# Patient Record
Sex: Female | Born: 1955 | Race: Black or African American | Hispanic: No | Marital: Single | Smoking: Never smoker
Health system: Southern US, Community
[De-identification: ages and names within clinical notes are randomized; demographics above are authoritative.]

## PROBLEM LIST (undated history)

## (undated) DIAGNOSIS — E785 Hyperlipidemia, unspecified: Secondary | ICD-10-CM

## (undated) DIAGNOSIS — H547 Unspecified visual loss: Secondary | ICD-10-CM

## (undated) DIAGNOSIS — Q909 Down syndrome, unspecified: Secondary | ICD-10-CM

## (undated) DIAGNOSIS — R55 Syncope and collapse: Secondary | ICD-10-CM

## (undated) HISTORY — DX: Unspecified visual loss: H54.7

## (undated) HISTORY — DX: Down syndrome, unspecified: Q90.9

## (undated) HISTORY — DX: Hyperlipidemia, unspecified: E78.5

## (undated) HISTORY — DX: Syncope and collapse: R55

---

## 2004-05-30 ENCOUNTER — Ambulatory Visit: Payer: Self-pay | Admitting: Internal Medicine

## 2004-06-07 ENCOUNTER — Ambulatory Visit (HOSPITAL_COMMUNITY): Admission: RE | Admit: 2004-06-07 | Discharge: 2004-06-07 | Payer: Self-pay | Admitting: Internal Medicine

## 2004-06-07 ENCOUNTER — Ambulatory Visit: Payer: Self-pay | Admitting: Internal Medicine

## 2005-12-12 ENCOUNTER — Ambulatory Visit: Payer: Self-pay | Admitting: Internal Medicine

## 2005-12-21 ENCOUNTER — Ambulatory Visit: Payer: Self-pay | Admitting: Internal Medicine

## 2006-08-03 DIAGNOSIS — H548 Legal blindness, as defined in USA: Secondary | ICD-10-CM | POA: Insufficient documentation

## 2006-08-03 DIAGNOSIS — Q909 Down syndrome, unspecified: Secondary | ICD-10-CM | POA: Insufficient documentation

## 2007-02-27 ENCOUNTER — Encounter (INDEPENDENT_AMBULATORY_CARE_PROVIDER_SITE_OTHER): Payer: Self-pay | Admitting: *Deleted

## 2007-02-27 ENCOUNTER — Ambulatory Visit: Payer: Self-pay | Admitting: Hospitalist

## 2007-02-27 LAB — CONVERTED CEMR LAB

## 2007-03-06 LAB — CONVERTED CEMR LAB
ALT: <8 units/L (ref 0–35)
AST: 19 units/L (ref 0–37)
Albumin: 4.4 g/dL (ref 3.5–5.2)
Alkaline Phosphatase: 62 units/L (ref 39–117)
BUN: 15 mg/dL (ref 6–23)
Basophils Absolute: 0.1 10*3/uL (ref 0.0–0.1)
Basophils Relative: 1 % (ref 0–1)
CO2: 24 meq/L (ref 19–32)
Calcium: 9.1 mg/dL (ref 8.4–10.5)
Chloride: 102 meq/L (ref 96–112)
Creatinine, Ser: 0.95 mg/dL (ref 0.40–1.20)
Eosinophils Absolute: 0.1 10*3/uL (ref 0.0–0.7)
Eosinophils Relative: 1 % (ref 0–5)
Glucose, Bld: 117 mg/dL — ABNORMAL HIGH (ref 70–99)
HCT: 44.3 % (ref 36.0–46.0)
Hemoglobin: 14.9 g/dL (ref 12.0–15.0)
Lymphocytes Relative: 48 % — ABNORMAL HIGH (ref 12–46)
Lymphs Abs: 2.6 10*3/uL (ref 0.7–3.3)
MCHC: 33.6 g/dL (ref 30.0–36.0)
MCV: 103.7 fL — ABNORMAL HIGH (ref 78.0–100.0)
Monocytes Absolute: 0.3 10*3/uL (ref 0.2–0.7)
Monocytes Relative: 5 % (ref 3–11)
Neutro Abs: 2.5 10*3/uL (ref 1.7–7.7)
Neutrophils Relative %: 45 % (ref 43–77)
Platelets: 238 10*3/uL (ref 150–400)
Potassium: 4.5 meq/L (ref 3.5–5.3)
RBC: 4.27 M/uL (ref 3.87–5.11)
RDW: 13.2 % (ref 11.5–14.0)
Sodium: 138 meq/L (ref 135–145)
TSH: 3.13 microintl units/mL (ref 0.350–5.50)
Total Bilirubin: 1 mg/dL (ref 0.3–1.2)
Total Protein: 8.5 g/dL — ABNORMAL HIGH (ref 6.0–8.3)
WBC: 5.5 10*3/uL (ref 4.0–10.5)

## 2007-03-21 LAB — HM PAP SMEAR

## 2007-07-23 ENCOUNTER — Encounter (INDEPENDENT_AMBULATORY_CARE_PROVIDER_SITE_OTHER): Payer: Self-pay | Admitting: *Deleted

## 2007-08-12 ENCOUNTER — Emergency Department (HOSPITAL_COMMUNITY): Admission: EM | Admit: 2007-08-12 | Discharge: 2007-08-12 | Payer: Self-pay | Admitting: Family Medicine

## 2007-08-22 ENCOUNTER — Emergency Department (HOSPITAL_COMMUNITY): Admission: EM | Admit: 2007-08-22 | Discharge: 2007-08-22 | Payer: Self-pay | Admitting: Emergency Medicine

## 2007-12-11 ENCOUNTER — Ambulatory Visit: Payer: Self-pay | Admitting: Internal Medicine

## 2007-12-11 DIAGNOSIS — L989 Disorder of the skin and subcutaneous tissue, unspecified: Secondary | ICD-10-CM | POA: Insufficient documentation

## 2007-12-20 ENCOUNTER — Ambulatory Visit (HOSPITAL_COMMUNITY): Admission: RE | Admit: 2007-12-20 | Discharge: 2007-12-20 | Payer: Self-pay | Admitting: Internal Medicine

## 2007-12-27 ENCOUNTER — Encounter (INDEPENDENT_AMBULATORY_CARE_PROVIDER_SITE_OTHER): Payer: Self-pay | Admitting: *Deleted

## 2007-12-31 ENCOUNTER — Encounter (INDEPENDENT_AMBULATORY_CARE_PROVIDER_SITE_OTHER): Payer: Self-pay | Admitting: *Deleted

## 2008-01-19 LAB — HM COLONOSCOPY

## 2008-03-13 ENCOUNTER — Ambulatory Visit: Payer: Self-pay | Admitting: *Deleted

## 2008-03-13 ENCOUNTER — Encounter (INDEPENDENT_AMBULATORY_CARE_PROVIDER_SITE_OTHER): Payer: Self-pay | Admitting: Internal Medicine

## 2008-03-13 LAB — CONVERTED CEMR LAB
BUN: 11 mg/dL (ref 6–23)
CO2: 21 meq/L (ref 19–32)
Calcium: 8.3 mg/dL — ABNORMAL LOW (ref 8.4–10.5)
Chloride: 105 meq/L (ref 96–112)
Cholesterol: 184 mg/dL (ref 0–200)
Creatinine, Ser: 0.82 mg/dL (ref 0.40–1.20)
Glucose, Bld: 124 mg/dL — ABNORMAL HIGH (ref 70–99)
HDL: 60 mg/dL (ref >39)
LDL Cholesterol: 100 mg/dL — ABNORMAL HIGH (ref 0–99)
Potassium: 3.7 meq/L (ref 3.5–5.3)
Sodium: 142 meq/L (ref 135–145)
Total CHOL/HDL Ratio: 3.1
Triglycerides: 118 mg/dL (ref <150)
VLDL: 24 mg/dL (ref 0–40)

## 2008-03-24 ENCOUNTER — Emergency Department (HOSPITAL_COMMUNITY): Admission: EM | Admit: 2008-03-24 | Discharge: 2008-03-24 | Payer: Self-pay | Admitting: Emergency Medicine

## 2008-04-10 ENCOUNTER — Emergency Department (HOSPITAL_COMMUNITY): Admission: EM | Admit: 2008-04-10 | Discharge: 2008-04-10 | Payer: Self-pay | Admitting: Emergency Medicine

## 2008-11-16 ENCOUNTER — Encounter: Payer: Self-pay | Admitting: Internal Medicine

## 2008-12-07 ENCOUNTER — Encounter: Payer: Self-pay | Admitting: Internal Medicine

## 2008-12-14 ENCOUNTER — Ambulatory Visit: Payer: Self-pay | Admitting: Internal Medicine

## 2008-12-14 ENCOUNTER — Encounter: Payer: Self-pay | Admitting: Internal Medicine

## 2008-12-29 ENCOUNTER — Ambulatory Visit (HOSPITAL_COMMUNITY): Admission: RE | Admit: 2008-12-29 | Discharge: 2008-12-29 | Payer: Self-pay | Admitting: Internal Medicine

## 2010-01-17 ENCOUNTER — Ambulatory Visit (HOSPITAL_COMMUNITY): Admission: RE | Admit: 2010-01-17 | Discharge: 2010-01-17 | Payer: Self-pay | Admitting: Internal Medicine

## 2010-01-17 LAB — HM MAMMOGRAPHY

## 2010-09-18 ENCOUNTER — Encounter: Payer: Self-pay | Admitting: Internal Medicine

## 2010-12-12 ENCOUNTER — Other Ambulatory Visit: Payer: Self-pay | Admitting: Family Medicine

## 2010-12-12 DIAGNOSIS — Z1231 Encounter for screening mammogram for malignant neoplasm of breast: Secondary | ICD-10-CM

## 2011-01-19 ENCOUNTER — Ambulatory Visit (HOSPITAL_COMMUNITY): Payer: Medicaid Other

## 2011-01-19 ENCOUNTER — Encounter: Payer: Self-pay | Admitting: Internal Medicine

## 2011-02-07 ENCOUNTER — Ambulatory Visit (HOSPITAL_COMMUNITY)
Admission: RE | Admit: 2011-02-07 | Discharge: 2011-02-07 | Disposition: A | Discharge status: Home / Self Care | Payer: Medicaid Other | Source: Ambulatory Visit | Attending: Family Medicine | Admitting: Family Medicine

## 2011-02-07 DIAGNOSIS — Z1231 Encounter for screening mammogram for malignant neoplasm of breast: Secondary | ICD-10-CM | POA: Insufficient documentation

## 2011-05-29 DIAGNOSIS — R55 Syncope and collapse: Secondary | ICD-10-CM

## 2011-05-29 HISTORY — DX: Syncope and collapse: R55

## 2011-06-02 LAB — I-STAT 8, (EC8 V) (CONVERTED LAB)
Acid-Base Excess: 1
BUN: 18
Bicarbonate: 27 — ABNORMAL HIGH
Chloride: 113 — ABNORMAL HIGH
Glucose, Bld: 110 — ABNORMAL HIGH
HCT: 43
Hemoglobin: 14.6
Operator id: 288831
Potassium: 3.9
Sodium: 146 — ABNORMAL HIGH
TCO2: 28
pCO2, Ven: 48.9
pH, Ven: 7.349 — ABNORMAL HIGH

## 2011-06-02 LAB — POCT I-STAT CREATININE
Creatinine, Ser: 1
Operator id: 288831

## 2011-06-02 LAB — CBC
HCT: 39.3
Hemoglobin: 13.3
MCHC: 33.7
MCV: 103.7 — ABNORMAL HIGH
Platelets: 367
RBC: 3.79 — ABNORMAL LOW
RDW: 14.2
WBC: 8.1

## 2011-06-02 LAB — CULTURE, ROUTINE-ABSCESS: Gram Stain: NONE SEEN

## 2011-06-02 LAB — DIFFERENTIAL
Basophils Absolute: 0
Basophils Relative: 0
Eosinophils Absolute: 0
Eosinophils Relative: 0
Lymphocytes Relative: 16
Lymphs Abs: 1.3
Monocytes Absolute: 0.3
Monocytes Relative: 3
Neutro Abs: 6.5
Neutrophils Relative %: 81 — ABNORMAL HIGH

## 2011-06-10 ENCOUNTER — Emergency Department (HOSPITAL_COMMUNITY)
Admission: EM | Admit: 2011-06-10 | Discharge: 2011-06-10 | Disposition: A | Discharge status: Home / Self Care | Payer: Medicaid Other | Attending: Emergency Medicine | Admitting: Emergency Medicine

## 2011-06-10 ENCOUNTER — Emergency Department (HOSPITAL_COMMUNITY): Payer: Medicaid Other

## 2011-06-10 DIAGNOSIS — R110 Nausea: Secondary | ICD-10-CM | POA: Insufficient documentation

## 2011-06-10 DIAGNOSIS — F79 Unspecified intellectual disabilities: Secondary | ICD-10-CM | POA: Insufficient documentation

## 2011-06-10 DIAGNOSIS — I951 Orthostatic hypotension: Secondary | ICD-10-CM | POA: Insufficient documentation

## 2011-06-10 DIAGNOSIS — Q909 Down syndrome, unspecified: Secondary | ICD-10-CM | POA: Insufficient documentation

## 2011-06-10 DIAGNOSIS — H543 Unqualified visual loss, both eyes: Secondary | ICD-10-CM | POA: Insufficient documentation

## 2011-06-10 LAB — CBC
HCT: 40.5 % (ref 36.0–46.0)
Hemoglobin: 13.8 g/dL (ref 12.0–15.0)
MCH: 34.8 pg — ABNORMAL HIGH (ref 26.0–34.0)
MCHC: 34.1 g/dL (ref 30.0–36.0)
MCV: 102.3 fL — ABNORMAL HIGH (ref 78.0–100.0)
Platelets: 216 10*3/uL (ref 150–400)
RBC: 3.96 MIL/uL (ref 3.87–5.11)
RDW: 13 % (ref 11.5–15.5)
WBC: 5 10*3/uL (ref 4.0–10.5)

## 2011-06-10 LAB — DIFFERENTIAL
Basophils Absolute: 0.1 10*3/uL (ref 0.0–0.1)
Basophils Relative: 2 % — ABNORMAL HIGH (ref 0–1)
Eosinophils Absolute: 0.1 10*3/uL (ref 0.0–0.7)
Eosinophils Relative: 2 % (ref 0–5)
Lymphocytes Relative: 35 % (ref 12–46)
Lymphs Abs: 1.7 10*3/uL (ref 0.7–4.0)
Monocytes Absolute: 0.4 10*3/uL (ref 0.1–1.0)
Monocytes Relative: 7 % (ref 3–12)
Neutro Abs: 2.7 10*3/uL (ref 1.7–7.7)
Neutrophils Relative %: 54 % (ref 43–77)

## 2011-06-10 LAB — BASIC METABOLIC PANEL
BUN: 14 mg/dL (ref 6–23)
CO2: 29 mEq/L (ref 19–32)
Calcium: 9.4 mg/dL (ref 8.4–10.5)
Chloride: 97 mEq/L (ref 96–112)
Creatinine, Ser: 1 mg/dL (ref 0.50–1.10)
GFR calc Af Amer: 72 mL/min — ABNORMAL LOW (ref >90)
GFR calc non Af Amer: 62 mL/min — ABNORMAL LOW (ref >90)
Glucose, Bld: 121 mg/dL — ABNORMAL HIGH (ref 70–99)
Potassium: 3.8 mEq/L (ref 3.5–5.1)
Sodium: 137 mEq/L (ref 135–145)

## 2011-06-10 LAB — URINALYSIS, ROUTINE W REFLEX MICROSCOPIC
Bilirubin Urine: NEGATIVE
Glucose, UA: NEGATIVE mg/dL
Hgb urine dipstick: NEGATIVE
Ketones, ur: NEGATIVE mg/dL
Leukocytes, UA: NEGATIVE
Nitrite: NEGATIVE
Protein, ur: NEGATIVE mg/dL
Specific Gravity, Urine: 1.01 (ref 1.005–1.030)
Urobilinogen, UA: 0.2 mg/dL (ref 0.0–1.0)
pH: 6.5 (ref 5.0–8.0)

## 2011-06-19 ENCOUNTER — Ambulatory Visit (HOSPITAL_COMMUNITY)
Admission: RE | Admit: 2011-06-19 | Discharge: 2011-06-19 | Disposition: A | Discharge status: Home / Self Care | Payer: Medicaid Other | Source: Ambulatory Visit | Attending: Internal Medicine | Admitting: Internal Medicine

## 2011-06-19 ENCOUNTER — Encounter: Payer: Self-pay | Admitting: Internal Medicine

## 2011-06-19 ENCOUNTER — Ambulatory Visit (INDEPENDENT_AMBULATORY_CARE_PROVIDER_SITE_OTHER): Payer: Medicaid Other | Admitting: Internal Medicine

## 2011-06-19 VITALS — BP 119/75 | HR 68 | Temp 96.7°F | Wt 112.5 lb

## 2011-06-19 DIAGNOSIS — R55 Syncope and collapse: Secondary | ICD-10-CM | POA: Insufficient documentation

## 2011-06-19 DIAGNOSIS — Z299 Encounter for prophylactic measures, unspecified: Secondary | ICD-10-CM

## 2011-06-19 DIAGNOSIS — I498 Other specified cardiac arrhythmias: Secondary | ICD-10-CM | POA: Insufficient documentation

## 2011-06-19 DIAGNOSIS — Z23 Encounter for immunization: Secondary | ICD-10-CM

## 2011-06-19 LAB — TSH: TSH: 2.567 u[IU]/mL (ref 0.350–4.500)

## 2011-06-19 NOTE — Progress Notes (Signed)
  Subjective:    Patient ID: Yvonne Tyler, female    DOB: 11/23/1955, 55 y.o.   MRN: [id number]  HPI  Patient is a 55 year old female with past medical history of Down syndrome and legal blindness.  Patient was seen last Saturday in the emergency department for syncope. A CT head, CBC and basic metabolic profile did not reveal any abnormalities. Urinalysis was also negative. Patient described the episode of syncope as feeling dizzy and legs giving out for a few seconds. The fall was documented by one of her friends who called 911 and patient was brought into the emergency department. There was no seizure activity reported. Patient does not have history of seizure. She did not report chest pain, palpitations, shortness of breath or any other symptoms prior to or after syncope. She lost consciousness for about a few seconds. This is her first episode and patient has never had any such episodes in the past.   No other complaints at this time.  Review of Systems  Constitutional: Negative for fever, activity change and appetite change.  HENT: Negative for sore throat.   Eyes:       Patient is legally blind and unable to see  Respiratory: Negative for cough and shortness of breath.   Cardiovascular: Negative for chest pain and leg swelling.  Gastrointestinal: Negative for nausea, abdominal pain, diarrhea, constipation and abdominal distention.  Genitourinary: Negative for frequency, hematuria and difficulty urinating.  Neurological: Positive for dizziness and syncope. Negative for seizures, speech difficulty, numbness and headaches.  Psychiatric/Behavioral: Negative for suicidal ideas and behavioral problems.       Objective:   Physical Exam  Constitutional: She is oriented to person, place, and time. She appears well-developed and well-nourished.  HENT:  Head: Normocephalic and atraumatic.  Eyes:       Patient has untreated cataract in both eyes, left worse than right.   Neck: Normal  range of motion. Neck supple. No JVD present. No thyromegaly present.  Cardiovascular: Normal rate, regular rhythm and intact distal pulses.  Exam reveals no gallop and no friction rub.   Murmur heard.      Patient has a 3/6, rumbling, systolic murmur best heard in the left sternal border without any radiation  Pulmonary/Chest: Effort normal and breath sounds normal. No respiratory distress. She has no wheezes. She has no rales.  Abdominal: Soft. Bowel sounds are normal. She exhibits no distension and no mass. There is no tenderness. There is no rebound and no guarding.  Musculoskeletal: Normal range of motion. She exhibits no edema and no tenderness.  Lymphadenopathy:    She has no cervical adenopathy.  Neurological: She is alert and oriented to person, place, and time.  Psychiatric: She has a normal mood and affect. Her behavior is normal.          Assessment & Plan:

## 2011-06-19 NOTE — Patient Instructions (Signed)
Syncope You have had a fainting (syncopal) spell. A fainting episode is a sudden, short-lived loss of consciousness. It results in complete recovery. It occurs because there has been a temporary shortage of oxygen and/or sugar (glucose) to the brain. CAUSES   Blood pressure pills and other medications that may lower blood pressure below normal. Sudden changes in posture (sudden standing).   Over-medication. Take your medications as directed.   Standing too long. This can cause blood to pool in the legs.   Seizure disorders.   Low blood sugar (hypoglycemia) of diabetes. This more commonly causes coma.   Bearing down to go to the bathroom. This can cause your blood pressure to rise suddenly. Your body compensates by making the blood pressure too low when you stop bearing down.   Hardening of the arteries where the brain temporarily does not receive enough blood.   Irregular heart beat and circulatory problems.   Fear, emotional distress, injury, sight of blood, or illness.  Your caregiver will send you home if the syncope was from non-worrisome causes (benign). Depending on your age and health, you may stay to be monitored and observed. If you return home, have someone stay with you if your caregiver feels that is desirable. It is very important to keep all follow-up referrals and appointments in order to properly manage this condition. This is a serious problem which can lead to serious illness and death if not carefully managed.  WARNING: Do not drive or operate machinery until your caregiver feels that it is safe for you to do so. SEEK IMMEDIATE MEDICAL CARE IF:   You have another fainting episode or faint while lying or sitting down. DO NOT DRIVE YOURSELF. Call 911 if no other help is available.   You have chest pain, are feeling sick to your stomach (nausea), vomiting or abdominal pain.   You have an irregular heartbeat or one that is very fast (pulse over 120 beats per minute).    You have a loss of feeling in some part of your body or lose movement in your arms or legs.   You have difficulty with speech, confusion, severe weakness, or visual problems.   You become sweaty and/or feel light headed.  Make sure you are rechecked as instructed. Document Released: 08/14/2005 Document Revised: 04/26/2011 Document Reviewed: 04/04/2007 [employer] Patient Information 2012 [employer]. 

## 2011-06-25 ENCOUNTER — Encounter: Payer: Self-pay | Admitting: Internal Medicine

## 2011-06-26 ENCOUNTER — Encounter: Payer: Medicaid Other | Admitting: Internal Medicine

## 2011-06-27 ENCOUNTER — Ambulatory Visit (HOSPITAL_COMMUNITY): Payer: Medicaid Other

## 2011-07-03 ENCOUNTER — Encounter: Payer: Self-pay | Admitting: Internal Medicine

## 2011-07-04 ENCOUNTER — Ambulatory Visit (HOSPITAL_COMMUNITY)
Admission: RE | Admit: 2011-07-04 | Discharge: 2011-07-04 | Disposition: A | Discharge status: Home / Self Care | Payer: Medicaid Other | Source: Ambulatory Visit | Attending: Internal Medicine | Admitting: Internal Medicine

## 2011-07-04 DIAGNOSIS — R55 Syncope and collapse: Secondary | ICD-10-CM

## 2011-07-04 DIAGNOSIS — Q909 Down syndrome, unspecified: Secondary | ICD-10-CM | POA: Insufficient documentation

## 2011-07-04 NOTE — Progress Notes (Signed)
*  PRELIMINARY RESULTS* Echocardiogram 2D Echocardiogram has been performed.  Clide Deutscher RDCS 07/04/2011, 11:59 AM

## 2011-07-11 ENCOUNTER — Encounter: Payer: Medicaid Other | Admitting: Internal Medicine

## 2011-11-28 ENCOUNTER — Other Ambulatory Visit: Payer: Self-pay | Admitting: Family Medicine

## 2011-11-28 DIAGNOSIS — Z1231 Encounter for screening mammogram for malignant neoplasm of breast: Secondary | ICD-10-CM

## 2012-02-08 ENCOUNTER — Ambulatory Visit (HOSPITAL_COMMUNITY): Payer: Medicaid Other | Attending: Family Medicine

## 2012-05-09 ENCOUNTER — Encounter: Payer: Self-pay | Admitting: Internal Medicine

## 2012-05-09 ENCOUNTER — Ambulatory Visit (INDEPENDENT_AMBULATORY_CARE_PROVIDER_SITE_OTHER): Payer: Medicaid Other | Admitting: Internal Medicine

## 2012-05-09 VITALS — BP 135/76 | HR 72 | Temp 97.8°F | Ht 59.0 in | Wt 104.9 lb

## 2012-05-09 DIAGNOSIS — Z23 Encounter for immunization: Secondary | ICD-10-CM

## 2012-05-09 DIAGNOSIS — Z0000 Encounter for general adult medical examination without abnormal findings: Secondary | ICD-10-CM

## 2012-05-09 DIAGNOSIS — Q909 Down syndrome, unspecified: Secondary | ICD-10-CM

## 2012-05-09 DIAGNOSIS — R011 Cardiac murmur, unspecified: Secondary | ICD-10-CM | POA: Insufficient documentation

## 2012-05-09 DIAGNOSIS — H548 Legal blindness, as defined in USA: Secondary | ICD-10-CM

## 2012-05-09 DIAGNOSIS — Z1231 Encounter for screening mammogram for malignant neoplasm of breast: Secondary | ICD-10-CM

## 2012-05-09 LAB — COMPLETE METABOLIC PANEL WITH GFR
AST: 18 U/L (ref 0–37)
Albumin: 3.9 g/dL (ref 3.5–5.2)
BUN: 22 mg/dL (ref 6–23)
CO2: 26 mEq/L (ref 19–32)
Calcium: 9.6 mg/dL (ref 8.4–10.5)
Chloride: 105 mEq/L (ref 96–112)
Glucose, Bld: 86 mg/dL (ref 70–99)
Potassium: 3.7 mEq/L (ref 3.5–5.3)

## 2012-05-09 LAB — LIPID PANEL
Cholesterol: 186 mg/dL (ref 0–200)
VLDL: 21 mg/dL (ref 0–40)

## 2012-05-09 LAB — CBC
HCT: 40.5 % (ref 36.0–46.0)
Hemoglobin: 13.8 g/dL (ref 12.0–15.0)
RBC: 3.97 MIL/uL (ref 3.87–5.11)
RDW: 13.3 % (ref 11.5–15.5)
WBC: 4.4 10*3/uL (ref 4.0–10.5)

## 2012-05-09 NOTE — Assessment & Plan Note (Addendum)
Stable. [id number], lives in mothers home with two cousins who are her primary care takers   -continue to monitor -routine labwork drawn today, f/u cbc, cmp, lipid panel

## 2012-05-09 NOTE — Assessment & Plan Note (Signed)
B/l cataracts, did not want surgery.  Legally blind.    -continue to monitor

## 2012-05-09 NOTE — Progress Notes (Signed)
Subjective:   Patient ID: Yvonne Tyler female   DOB: Nov 22, 1955 56 y.o.   MRN: [id number]  HPI: Ms.Yvonne Tyler is a 56 y.o. 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  Ms. Yvonne Tyler is accompanied by her 2 cousins who are her primary caretakers and lives with her in her mother's home.  She is very cheerful, reports no major complaints at this time, and loves to dance.  She denies any other syncopal episodes since her prior hospital admission in 2012.   Her family would like a referral for a mammogram.  She is also due for tetanus vaccination and influenza which we will do in clinic today.  She refuses to get a Pap smear, denies having sexual intercourse, and family claims she was very uncomfortable in the past when she tried to have a papsmear and refuses to get one since then.  She has also elected not to get cataract surgery.  Otherwise [name] reports no issues getting around the house, she is walking usually with the assistance of her caretakers, has a good appetite, and sleeps well throughout the night. She denies any fever, chills, nausea, vomiting, diarrhea, abdominal pain, chest pain, shortness of breath, vaginal bleeding, or any other urinary complaints at this time.  She denies every smoking, illicit drug use, and has a beer once a year on her birthday.    Past Medical History  Diagnosis Date  . Hyperlipidemia   . Blindness   . Down syndrome   . Syncope Oct 2012    EKG showed Bradycardia in 50's otherwise normal.   No current outpatient prescriptions on file.   No family history on file. History   Social History  . Marital Status: Single    Spouse Name: N/A    Number of Children: N/A  . Years of Education: N/A   Social History Main  Topics  . Smoking status: Never Smoker   . Smokeless tobacco: None  . Alcohol Use: None     Beer once a year on her birthday.  . Drug Use: None  . Sexually Active: None   Other Topics Concern  . None   Social History Narrative  . None   Review of Systems: Constitutional: Denies fever, chills, diaphoresis, appetite change and fatigue.  HEENT: Legally blind, b/l cataract. Denies photophobia, eye pain, redness, hearing loss, ear pain, congestion, sore throat, rhinorrhea, sneezing, mouth sores, trouble swallowing, neck pain, neck stiffness and tinnitus.   Respiratory: Denies SOB, DOE, cough, chest tightness,  and wheezing.   Cardiovascular: Systolic murmur. Denies chest pain, palpitations and leg swelling.  Gastrointestinal: Denies nausea, vomiting, abdominal pain, diarrhea, constipation, blood in stool and abdominal distention.  Genitourinary: Denies dysuria, urgency, frequency, hematuria, flank pain and difficulty urinating.  Musculoskeletal: Denies myalgias, back pain, joint swelling, arthralgias and gait problem.  Skin: Denies pallor, rash and wound.  Neurological: Hx of syncopal episode 2012.  Down Syndrome. Denies dizziness, seizures, syncope, weakness, light-headedness, numbness and headaches.  Hematological: Denies adenopathy. Easy bruising, personal or family bleeding history  Psychiatric/Behavioral: Denies suicidal ideation, mood changes, confusion, nervousness, sleep disturbance and agitation  Objective:  Physical Exam: Filed Vitals:   05/09/12 1001  BP: 135/76  Pulse: 72  Temp: 97.8 F (36.6 C)  TempSrc: Oral  Height: 4\' 11"  (1.499  m)  Weight: 104 lb 14.4 oz (47.582 kg)  SpO2: 98%   Constitutional: Vital signs reviewed.  Patient is a well-developed and well-nourished legally blind female in no acute distress and cooperative with exam. Alert and oriented x3.  Head: Normocephalic and atraumatic Ear: TM normal bilaterally Mouth: no erythema or exudates, MMM, no teeth,  does not wear dentures regularly.   Eyes: B/L Cataracts, left pupil completely opacified and white, right pupil with smaller whiteness covering.  Legally blind.  Neck: Supple, Trachea midline normal ROM, No JVD, mass, thyromegaly, or carotid bruit present.  Cardiovascular: RRR, S1 normal, S2 normal, +2/6 SEM left lower sternal border.  Pulmonary/Chest: CTAB, no wheezes, rales, or rhonchi Abdominal: Soft. Non-tender, non-distended, bowel sounds are normal, no masses, organomegaly, or guarding present.  GU: no CVA tenderness Musculoskeletal: No joint deformities, erythema, or stiffness, ROM full and nontender, +2dp b/l,  Hematology: no cervical, inginal, or axillary adenopathy.  Neurological: Downsyndrome. A&O x3, Strength is normal and symmetric bilaterally, cranial nerve II-XII are grossly intact, no focal motor deficit, sensory intact to light touch bilaterally.  Skin: Warm, dry and intact. No rash, cyanosis, or clubbing.  Psychiatric: Downsyndrome. Normal mood and affect, talkative, cheerful. speech and behavior is normal. Judgment and thought content normal.   Assessment & Plan:

## 2012-05-09 NOTE — Patient Instructions (Signed)
Please follow up regularly with our clinic, next visit can be in 6-12 months  If light-headedness or syncope returns, call clinic or go to ED  Please get mammogram screening done as referred during this visit   Flu and Tetanus shot given today  Take care of yourself, nice to see you, keep dancing.  Call the clinic if you have any questions or concerns

## 2012-05-09 NOTE — Assessment & Plan Note (Signed)
Flu and tetanus vaccinations given today

## 2012-05-09 NOTE — Assessment & Plan Note (Signed)
Referred to breast center for mammogram

## 2012-05-10 NOTE — Progress Notes (Signed)
INTERNAL MEDICINE TEACHING ATTENDING ADDENDUM - Yvonne Catalina, MD: I personally saw and evaluated Yvonne Tyler in this clinic visit in conjunction with the resident, Dr. Virgina Organ. I have discussed patient's plan of care with medical resident during this visit. I have confirmed the physical exam findings and have read and agree with the clinic note including the plan.

## 2012-05-15 ENCOUNTER — Ambulatory Visit
Admission: RE | Admit: 2012-05-15 | Discharge: 2012-05-15 | Disposition: A | Discharge status: Home / Self Care | Payer: Self-pay | Source: Ambulatory Visit | Attending: Internal Medicine | Admitting: Internal Medicine

## 2012-05-15 DIAGNOSIS — Z1231 Encounter for screening mammogram for malignant neoplasm of breast: Secondary | ICD-10-CM

## 2013-01-16 IMAGING — CR DG CHEST 2V
2 series · 2 of 2 positions shown · non-contrast
Comparison: 08/22/2007

CLINICAL DATA: Fall

CHEST - 2 VIEW

[w chest pa]
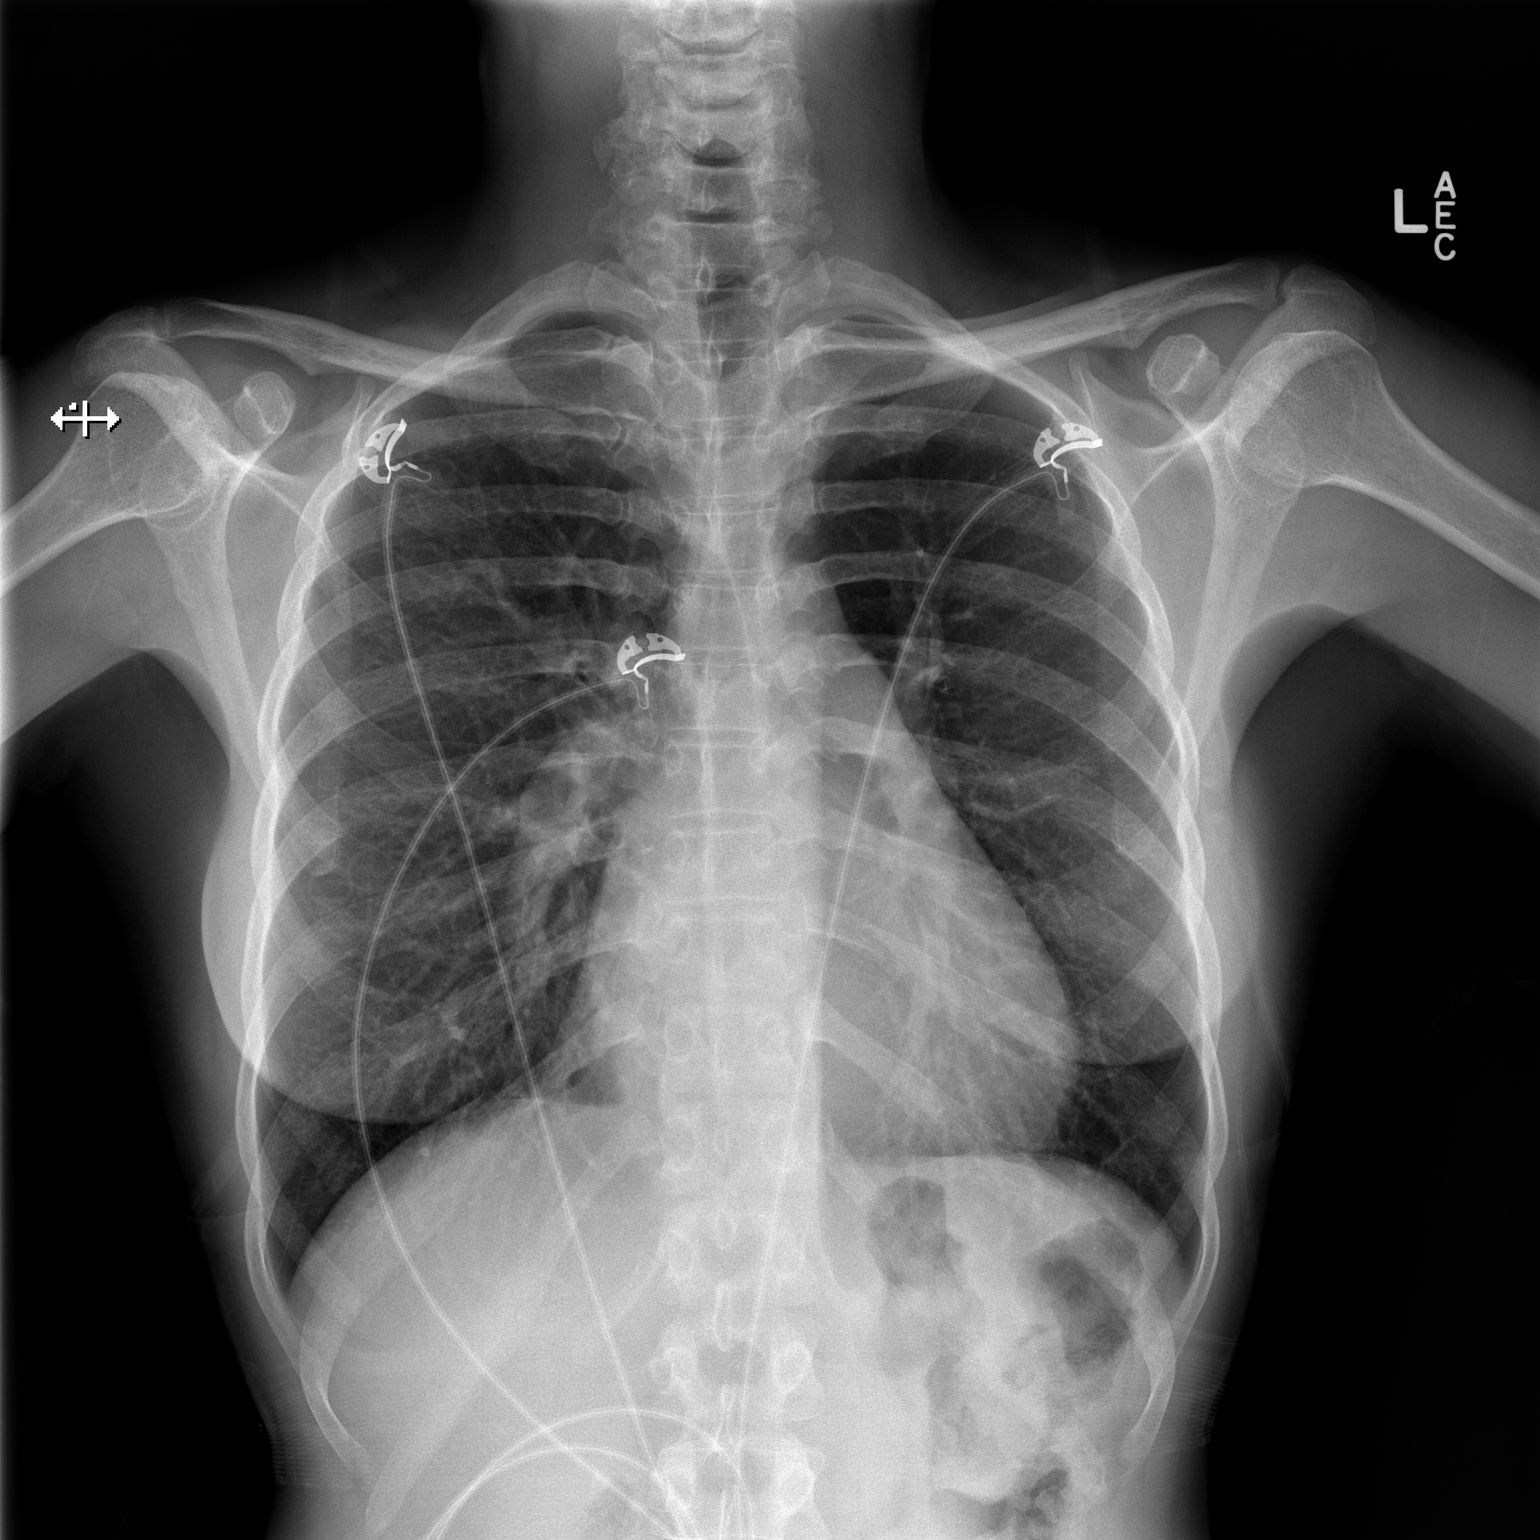

[w chest lat]
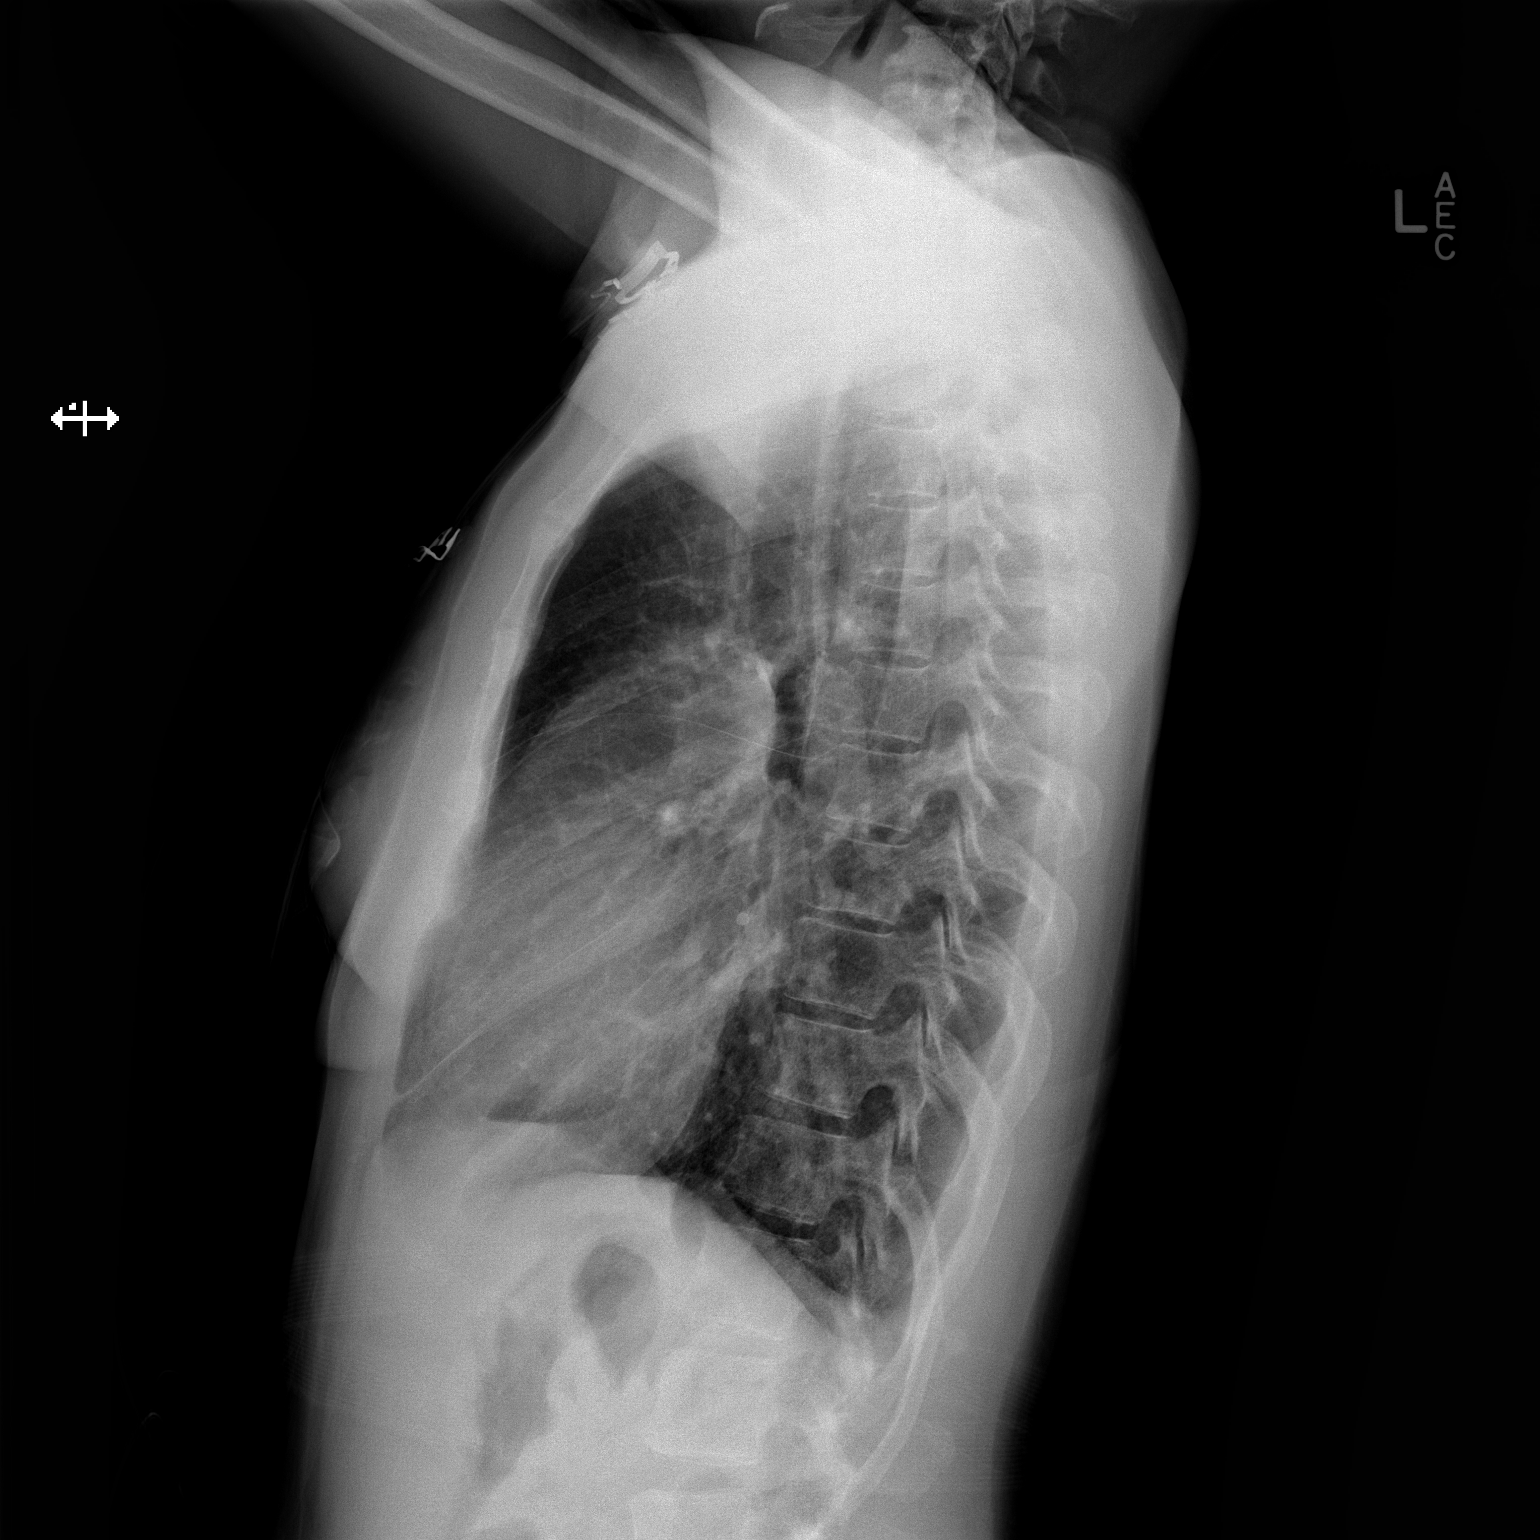

[2 of 2 positions shown; findings below may reference images not displayed]

FINDINGS: Chronic interstitial markings.  The lungs are otherwise
clear.  No pleural effusion or pneumothorax.

The heart is top normal in size.

Visualized osseous structures are within normal limits.
IMPRESSION: No evidence of acute cardiopulmonary disease.

## 2013-04-01 ENCOUNTER — Encounter: Payer: Self-pay | Admitting: Internal Medicine

## 2013-04-22 ENCOUNTER — Ambulatory Visit (INDEPENDENT_AMBULATORY_CARE_PROVIDER_SITE_OTHER): Payer: Medicaid Other | Admitting: Internal Medicine

## 2013-04-22 ENCOUNTER — Encounter: Payer: Self-pay | Admitting: Internal Medicine

## 2013-04-22 VITALS — BP 107/67 | HR 62 | Temp 97.8°F | Resp 20 | Ht 58.5 in | Wt 112.8 lb

## 2013-04-22 DIAGNOSIS — Z1239 Encounter for other screening for malignant neoplasm of breast: Secondary | ICD-10-CM

## 2013-04-22 DIAGNOSIS — H548 Legal blindness, as defined in USA: Secondary | ICD-10-CM

## 2013-04-22 DIAGNOSIS — R011 Cardiac murmur, unspecified: Secondary | ICD-10-CM

## 2013-04-22 MED ORDER — CARBAMIDE PEROXIDE 6.5 % OT SOLN: 5.0000 [drp] | Freq: Two times a day (BID) | OTIC | Status: AC

## 2013-04-22 NOTE — Progress Notes (Signed)
Case discussed with Dr. Qureshi soon after the resident saw the patient.  We reviewed the resident's history and exam and pertinent patient test results.  I agree with the assessment, diagnosis, and plan of care documented in the resident's note. 

## 2013-04-22 NOTE — Assessment & Plan Note (Signed)
B/l cataracts. Claim to have seen Dr. Nile Riggs in the past and were told nothing could be done. Do not wish to pursue further workup or surgery at this time

## 2013-04-22 NOTE — Progress Notes (Signed)
   Subjective:   Patient ID: Yvonne Tyler female   DOB: 1956-07-04 57 y.o.   MRN: [id number]  HPI: Ms.Yvonne Tyler is a 57 y.o. 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 She is in good spirits, talkative, still dancing, and smiling.  She reports feeling fine with no complaints at this time. She denies any fever, chills, N/V/D, abdominal pain, dysuria, incontinence, chest pain, shortness of breath, or syncope.  Her caretakers, cousins Yvonne Tyler is who she lives with) is also present and says she is due for her yearly mammogram that they would like to get scheduled.  They also would like to see a podiatrist as her toe nails are hard to cut and thick.   Past Medical History  Diagnosis Date  . Hyperlipidemia   . Blindness   . Down syndrome   . Syncope Oct 2012    EKG showed Bradycardia in 50's otherwise normal.   No current outpatient prescriptions on file.   No current facility-administered medications for this visit.   No family history on file. History   Social History  . Marital Status: Single    Spouse Name: N/A    Number of Children: N/A  . Years of Education: N/A   Social History Main Topics  . Smoking status: Never Smoker   . Smokeless tobacco: Not on file  . Alcohol Use: Not on file     Comment: Beer once a year on her birthday.  . Drug Use: Not on file  . Sexual Activity: Not on file   Other Topics Concern  . Not on file   Social History Narrative  . No narrative on file   Review of Systems:  Constitutional:  Denies fever, chills, diaphoresis, appetite change and fatigue.   HEENT:  B/l blind and cataracts. Denies congestion, sore throat, rhinorrhea, sneezing  Respiratory:  Denies SOB, DOE, cough, and wheezing.   Cardiovascular:  Murmur. Denies chest pain, palpitations, and  leg swelling.   Gastrointestinal:  Denies nausea, vomiting, abdominal pain, diarrhea, constipation, blood in stool and abdominal distention.   Genitourinary:  Denies dysuria, urgency, frequency, hematuria, flank pain and difficulty urinating.   Musculoskeletal:  Denies myalgias, back pain, joint swelling, arthralgias  Skin:  Denies pallor, rash and wound.   Neurological:  MR. Denies dizziness, seizures, syncope, weakness, light-headedness, numbness and headaches.    Objective:  Physical Exam: Filed Vitals:   04/22/13 1326  BP: 107/67  Pulse: 62  Temp: 97.8 F (36.6 C)  TempSrc: Oral  Resp: 20  Height: 4' 10.5" (1.486 m)  Weight: 112 lb 12.8 oz (51.166 kg)  SpO2: 99%   Vitals reviewed. General: sitting in chair, NAD  HEENT: b/l cataracts, left pupil complete white opacification, right pupil with overlying white layer. B/l blindness Cardiac: RRR, +3/6 SEM heard loudest L lower and mid sternal border Pulm: clear to auscultation bilaterally, no wheezes, rales, or rhonchi Abd: soft, nontender, nondistended, BS present Ext: warm and well perfused, no pedal edema, +2DP B/L, thick toe nails, non-tender to palpation Skin; dry Neuro: hx of down syndrome, sometimes difficult to understand, alert and oriented X3, cranial nerves II-XII grossly intact, follows commands, strength and sensation to light touch equal in bilateral upper and lower extremities  Assessment & Plan:  Discussed with Dr. Aundria Rud

## 2013-04-22 NOTE — Patient Instructions (Signed)
Please try using the ear wax removal drops: Ear wax removal: Otic: Tilt head sideways and instill 5-10 drops twice daily up to 4 days, tip of applicator should not enter ear canal; keep drops in ear for several minutes by keeping head tilted and placing cotton in ear  Have your mammogram and flu shot done in the upcoming months  It was a pleasure seeing you again, take care

## 2013-04-22 NOTE — Assessment & Plan Note (Signed)
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 Stable, syncopal episode x1 in 2012, no more episodes since then.  -continue to monitor.

## 2013-05-16 ENCOUNTER — Ambulatory Visit (HOSPITAL_COMMUNITY)
Admission: RE | Admit: 2013-05-16 | Discharge: 2013-05-16 | Disposition: A | Discharge status: Home / Self Care | Payer: Medicaid Other | Source: Ambulatory Visit | Attending: Internal Medicine | Admitting: Internal Medicine

## 2013-05-16 DIAGNOSIS — Z1239 Encounter for other screening for malignant neoplasm of breast: Secondary | ICD-10-CM

## 2013-05-16 DIAGNOSIS — Z1231 Encounter for screening mammogram for malignant neoplasm of breast: Secondary | ICD-10-CM | POA: Insufficient documentation

## 2013-05-19 ENCOUNTER — Encounter: Payer: Self-pay | Admitting: Internal Medicine

## 2013-06-25 ENCOUNTER — Emergency Department (HOSPITAL_COMMUNITY): Payer: Medicaid Other

## 2013-06-25 ENCOUNTER — Encounter (HOSPITAL_COMMUNITY): Payer: Self-pay | Admitting: Emergency Medicine

## 2013-06-25 ENCOUNTER — Emergency Department (HOSPITAL_COMMUNITY)
Admission: EM | Admit: 2013-06-25 | Discharge: 2013-06-25 | Disposition: A | Discharge status: Home / Self Care | Payer: Medicaid Other | Attending: Emergency Medicine | Admitting: Emergency Medicine

## 2013-06-25 DIAGNOSIS — R5381 Other malaise: Secondary | ICD-10-CM | POA: Insufficient documentation

## 2013-06-25 DIAGNOSIS — R55 Syncope and collapse: Secondary | ICD-10-CM

## 2013-06-25 DIAGNOSIS — H543 Unqualified visual loss, both eyes: Secondary | ICD-10-CM | POA: Insufficient documentation

## 2013-06-25 DIAGNOSIS — Z862 Personal history of diseases of the blood and blood-forming organs and certain disorders involving the immune mechanism: Secondary | ICD-10-CM | POA: Insufficient documentation

## 2013-06-25 DIAGNOSIS — Z8639 Personal history of other endocrine, nutritional and metabolic disease: Secondary | ICD-10-CM | POA: Insufficient documentation

## 2013-06-25 DIAGNOSIS — R197 Diarrhea, unspecified: Secondary | ICD-10-CM | POA: Insufficient documentation

## 2013-06-25 LAB — BASIC METABOLIC PANEL WITH GFR
Calcium: 9.2 mg/dL (ref 8.4–10.5)
Creatinine, Ser: 1.07 mg/dL (ref 0.50–1.10)
GFR calc Af Amer: 65 mL/min — ABNORMAL LOW (ref >90)
GFR calc non Af Amer: 57 mL/min — ABNORMAL LOW (ref >90)
Sodium: 137 meq/L (ref 135–145)

## 2013-06-25 LAB — CBC WITH DIFFERENTIAL/PLATELET
Basophils Absolute: 0.1 K/uL (ref 0.0–0.1)
Basophils Relative: 1 % (ref 0–1)
Eosinophils Absolute: 0.1 K/uL (ref 0.0–0.7)
Eosinophils Relative: 1 % (ref 0–5)
HCT: 40.3 % (ref 36.0–46.0)
Hemoglobin: 14.1 g/dL (ref 12.0–15.0)
Lymphocytes Relative: 27 % (ref 12–46)
Lymphs Abs: 2.2 10*3/uL (ref 0.7–4.0)
MCH: 36.5 pg — ABNORMAL HIGH (ref 26.0–34.0)
MCHC: 35 g/dL (ref 30.0–36.0)
MCV: 104.4 fL — ABNORMAL HIGH (ref 78.0–100.0)
Monocytes Absolute: 0.5 K/uL (ref 0.1–1.0)
Monocytes Relative: 6 % (ref 3–12)
Neutro Abs: 5.1 10*3/uL (ref 1.7–7.7)
Neutrophils Relative %: 65 % (ref 43–77)
Platelets: 200 K/uL (ref 150–400)
RBC: 3.86 MIL/uL — ABNORMAL LOW (ref 3.87–5.11)
RDW: 12.9 % (ref 11.5–15.5)
WBC: 8 K/uL (ref 4.0–10.5)

## 2013-06-25 LAB — TROPONIN I
Troponin I: <0.3 ng/mL (ref <0.30)
Troponin I: <0.3 ng/mL (ref <0.30)

## 2013-06-25 LAB — GLUCOSE, CAPILLARY: Glucose-Capillary: 83 mg/dL (ref 70–99)

## 2013-06-25 LAB — BASIC METABOLIC PANEL
BUN: 19 mg/dL (ref 6–23)
CO2: 25 mEq/L (ref 19–32)
Chloride: 100 mEq/L (ref 96–112)
Glucose, Bld: 98 mg/dL (ref 70–99)
Potassium: 4 mEq/L (ref 3.5–5.1)

## 2013-06-25 MED ORDER — SODIUM CHLORIDE 0.9 % IV BOLUS (SEPSIS): 500.0000 mL | Freq: Once | INTRAVENOUS | Status: DC

## 2013-06-25 NOTE — ED Notes (Addendum)
Per EMS: pt had near syncopal episode x2 today starting around 1400.  Pt skin is clammy but not diaphoretic. cbg 113. 20g l fa. Pt blind bilat. Hx trisomy 34

## 2013-06-25 NOTE — ED Notes (Signed)
Patient is resting comfortably. 

## 2013-06-25 NOTE — ED Notes (Signed)
Family at bedside. 

## 2013-06-25 NOTE — ED Notes (Signed)
Patient is alert and orientedx4.  Patient was explained discharge instructions and they understood them with no questions.  The patient's cousin, [name] is taking the patient home.

## 2013-06-25 NOTE — Discharge Instructions (Signed)
Near-Syncope  Near-syncope is sudden weakness, dizziness, or feeling like you might pass out (faint). This may occur when getting up after sitting or while standing for a long period of time. Near-syncope can be caused by a drop in blood pressure. This is a common reaction, but it may occur to a greater degree in people taking medicines to control their blood pressure. Fainting often occurs when the blood pressure or pulse is too low to provide enough blood flow to the brain to keep you conscious. Fainting and near-syncope are not usually due to serious medical problems. However, certain people should be more cautious in the event of near-syncope, including elderly patients, patients with diabetes, and patients with a history of heart conditions (especially irregular rhythms).   CAUSES    Drop in blood pressure.   Physical pain.   Dehydration.   Heat exhaustion.   Emotional distress.   Low blood sugar.   Internal bleeding.   Heart and circulatory problems.   Infections.  SYMPTOMS    Dizziness.   Feeling sick to your stomach (nauseous).   Nearly fainting.   Body numbness.   Turning pale.   Tunnel vision.   Weakness.  HOME CARE INSTRUCTIONS    Lie down right away if you start feeling like you might faint. Breathe deeply and steadily. Wait until all the symptoms have passed. Most of these episodes last only a few minutes. You may feel tired for several hours.   Drink enough fluids to keep your urine clear or pale yellow.   If you are taking blood pressure or heart medicine, get up slowly, taking several minutes to sit and then stand. This can reduce dizziness that is caused by a drop in blood pressure.  SEEK IMMEDIATE MEDICAL CARE IF:    You have a severe headache.   Unusual pain develops in the chest, abdomen, or back.   There is bleeding from the mouth or rectum, or you have black or tarry stool.   An irregular heartbeat or a very rapid pulse develops.   You have repeated fainting or  seizure-like jerking during an episode.   You faint when sitting or lying down.   You develop confusion.   You have difficulty walking.   Severe weakness develops.   Vision problems develop.  MAKE SURE YOU:    Understand these instructions.   Will watch your condition.   Will get help right away if you are not doing well or get worse.  Document Released: 08/14/2005 Document Revised: 11/06/2011 Document Reviewed: 09/30/2010  [employer] Patient Information 2014 [employer].

## 2013-06-25 NOTE — ED Provider Notes (Signed)
CSN: [id number]     Arrival date & time 06/25/13  1412 History   First MD Initiated Contact with Patient 06/25/13 1420     Chief Complaint  Patient presents with  . Near Syncope   (Consider location/radiation/quality/duration/timing/severity/associated sxs/prior Treatment) HPI Comments: Pt was outside with caregiver getting frest air and walking and had recently stood up, felt faint, was not sweaty.  Pt did not completely pass out.  Pt had syncope in 2012 also.  Pt then had a loose BM, no blood.  Pt was resting inside, felt weak and faint again and thus came to the ED for evaluation.  No CP, pt reports she feels fine now.  Pt takes no medications.  Pt has a h/o Down's syndrome.  No recent cough or cold symptoms, no N/V.  While pt had a 2nd BM, she was a little diaphoretic per caregiver, but seems resolved at this point.  BM's were loose, not watery, no blood.  Pt denies abd pain then or now.    Patient is a 57 y.o. female presenting with weakness. The history is provided by the patient, a relative and a caregiver.  Weakness This is a recurrent problem. The current episode started 1 to 2 hours ago. The problem has been resolved. Pertinent negatives include no chest pain, no abdominal pain, no headaches and no shortness of breath. Nothing aggravates the symptoms. Nothing relieves the symptoms.    Past Medical History  Diagnosis Date  . Hyperlipidemia   . Blindness   . Down syndrome   . Syncope Oct 2012    EKG showed Bradycardia in 50's otherwise normal.   History reviewed. No pertinent past surgical history. History reviewed. No pertinent family history. History  Substance Use Topics  . Smoking status: Never Smoker   . Smokeless tobacco: Never Used  . Alcohol Use: Yes     Comment: Beer once a year on her birthday.   OB History   Grav Para Term Preterm Abortions TAB SAB Ect Mult Living                 Review of Systems  Respiratory: Negative for shortness of breath.    Cardiovascular: Negative for chest pain.  Gastrointestinal: Positive for diarrhea. Negative for nausea, vomiting, abdominal pain and blood in stool.  Neurological: Positive for weakness. Negative for headaches.       Near syncope  All other systems reviewed and are negative.    Allergies  Review of patient's allergies indicates no known allergies.  Home Medications  No current outpatient prescriptions on file. BP 131/78  Pulse 66  Temp(Src) 97.6 F (36.4 C) (Oral)  Resp 18  Ht 4\' 11"  (1.499 m)  Wt 113 lb (51.256 kg)  BMI 22.81 kg/m2  SpO2 95% Physical Exam  Nursing note and vitals reviewed. Constitutional: She appears well-developed and well-nourished. No distress.  HENT:  Head: Normocephalic.  Eyes: EOM are normal.  Cardiovascular: Normal rate and intact distal pulses.   Abdominal: Soft.  Neurological: She is alert. She displays no atrophy. No cranial nerve deficit or sensory deficit. She exhibits normal muscle tone. Coordination normal.  Pt is at baseline mentation, baseline speech per family, normal finger to nose, gait not tested.  CN 2-12 grossly intact except pt has h/o blindness.  Skin: Skin is warm.    ED Course  Procedures (including critical care time) Labs Review Labs Reviewed  CBC WITH DIFFERENTIAL - Abnormal; Notable for the following:    RBC 3.86 (*)  MCV 104.4 (*)    MCH 36.5 (*)    All other components within normal limits  BASIC METABOLIC PANEL - Abnormal; Notable for the following:    GFR calc non Af Amer 57 (*)    GFR calc Af Amer 65 (*)    All other components within normal limits  TROPONIN I  GLUCOSE, CAPILLARY  TROPONIN I   Imaging Review Dg Chest 2 View  06/25/2013   CLINICAL DATA:  Near syncope.  EXAM: CHEST  2 VIEW  COMPARISON:  06/10/2011.  FINDINGS: The cardiopericardial silhouette is enlarged. There is pulmonary vascular congestion. No airspace disease. Small bilateral pleural effusions are present in the costophrenic angles on  the lateral view.  IMPRESSION: Cardiomegaly and pulmonary vascular congestion.   Electronically Signed   By: Andreas Newport M.D.   On: 06/25/2013 15:52    EKG Interpretation     Ventricular Rate:  75 PR Interval:  167 QRS Duration: 86 QT Interval:  417 QTC Calculation: 466 R Axis:   105 Text Interpretation:  Sinus rhythm Biatrial enlargement Consider right ventricular hypertrophy ST elevation, consider inferior injury  versus PR depression noted inferiorly Abnormal ECG           ra sat is 98% and I interpret to be normal.    After observation in the ED for 3 hours, serial troponins negative, no further episodes of diarrhea or dizziness, near syncope.  Pt remains usual mental status.  Will d/c home.   MDM   1. Near syncope      Pt with near syncope associated with 2 episodes of loose stools, no CP, no obvious diaphoresis, SOB to suggest significant cardiac ischemia . ST elevation versus PR depression noted in current ECG, no reciprocal changes, and symptoms are not suggestive of acute cardiac.  I favor non specific change versus PR depression.  Will monitor, check labs, cycle troponins.      Gavin Pound. Ebrahim Deremer, MD 06/26/13 2240

## 2013-07-08 ENCOUNTER — Ambulatory Visit (HOSPITAL_COMMUNITY)
Admission: RE | Admit: 2013-07-08 | Discharge: 2013-07-08 | Disposition: A | Discharge status: Home / Self Care | Payer: Medicaid Other | Source: Ambulatory Visit | Attending: Internal Medicine | Admitting: Internal Medicine

## 2013-07-08 ENCOUNTER — Ambulatory Visit (INDEPENDENT_AMBULATORY_CARE_PROVIDER_SITE_OTHER): Payer: Medicaid Other | Admitting: Internal Medicine

## 2013-07-08 ENCOUNTER — Encounter: Payer: Self-pay | Admitting: Internal Medicine

## 2013-07-08 DIAGNOSIS — R55 Syncope and collapse: Secondary | ICD-10-CM

## 2013-07-08 DIAGNOSIS — Z23 Encounter for immunization: Secondary | ICD-10-CM

## 2013-07-08 DIAGNOSIS — M79609 Pain in unspecified limb: Secondary | ICD-10-CM

## 2013-07-08 DIAGNOSIS — M79676 Pain in unspecified toe(s): Secondary | ICD-10-CM | POA: Insufficient documentation

## 2013-07-08 DIAGNOSIS — R9431 Abnormal electrocardiogram [ECG] [EKG]: Secondary | ICD-10-CM | POA: Insufficient documentation

## 2013-07-08 NOTE — Patient Instructions (Signed)
Dear Ms. Hildebran,  Thank you for coming in today. You have received your flu vaccine today. I am glad you are feeling better. If you have any more symptoms of feeling light headed, dizzy, near fainting or you do faint, or chest pain, nausea, vomiting, sweating call and let us know right away [phone] or go to ED  Near-Syncope Near-syncope (commonly known as near fainting) is sudden weakness, dizziness, or feeling like you might pass out. This can happen when getting up or while standing for a long time. It is caused by a sudden decrease in blood flow to the brain, which can occur for various reasons. Most of the reasons are not serious.  HOME CARE Watch your condition for any changes.  Have someone stay with you until you feel stable.  If you feel like you are going to pass out:  Lie down right away.  Breathe deeply and steadily.  Move only when the feeling has gone away. Most of the time, this feeling lasts only a few minutes. You may feel tired for several hours.  Drink enough fluids to keep your pee (urine) clear or pale yellow.  If you are taking blood pressure or heart medicine, stand up slowly.  Follow up with your doctor as told. GET HELP RIGHT AWAY IF:   You have a severe headache.  You have unusual pain in the chest, belly (abdomen), or back.  You have bleeding from the mouth or butt (rectum), or you have black or tarry poop (stool).  You feel your heart beat differently than normal, or you have a very fast pulse.  You pass out, or you twitch and shake when you pass out.  You pass out when sitting or lying down.  You feel confused.  You have trouble walking.  You are weak.  You have vision problems. MAKE SURE YOU:   Understand these instructions.  Will watch your condition.  Will get help right away if you are not doing well or get worse. Document Released: 01/31/2008 Document Revised: 04/16/2013 Document Reviewed: 01/17/2013 [facility] Patient Information  2014 [place], [place].

## 2013-07-08 NOTE — Assessment & Plan Note (Signed)
Enlarged and hardened toe nails that are occasionally painful and uncomfortable. Family unable to cut toe nails.   -requesting podiatry referral

## 2013-07-08 NOTE — Assessment & Plan Note (Addendum)
Near syncope end of October, went to ED for evaluation and discharged that day as symptoms had resolved and have not returned since then. Was noted to have loose BM at that time but today caretaker, veronica, reports all has resolved and no complaints. EKG rechecked in clinic today, shows improvement from abnormalities noted on EKG from ED but troponins x2 at that time were negative. EKG today HR 60bpm, NSR, L atrial enlargement, R axis deviation, inverted t waves in v1 and avR, flattening in avL. No chest pain, no dizziness, no headaches, no more episodes of near syncope or syncope. Not noted not be orthostatic when vitals checked: lying BP 114/73 with HR 60 and standing 139/77 HR 71  -will continue to monitor for now, if recurrent, will need to go to cardiology for further follow up

## 2013-07-08 NOTE — Progress Notes (Signed)
Subjective:   Patient ID: Yvonne Tyler female   DOB: Aug 14, 1956 57 y.o.   MRN: [id number]  HPI: YvonneShali T Tyler is a 57 y.o. 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 She was last seen in ED on 06/25/13 for near syncope. Per ED report, near syncope associated with loose BM's that resolved by the time of ED evaluation.  Since then, her cousin (care taker, [name]) reports no return of symptoms and she has been feeling fine. Yvonne Tyler also reports feeling great, denies dizziness, headaches, nausea, vomiting, abdominal pain, chest pain, or feeling like she will faint at this time. She is very cheerful, talking, and able to stand and walk without assistance.   Past Medical History  Diagnosis Date  . Hyperlipidemia   . Blindness   . Down syndrome   . Syncope Oct 2012    EKG showed Bradycardia in 50's otherwise normal.   No current outpatient prescriptions on file.   No current facility-administered medications for this visit.   No family history on file. History   Social History  . Marital Status: Single    Spouse Name: N/A    Number of Children: N/A  . Years of Education: N/A   Social History Main Topics  . Smoking status: Never Smoker   . Smokeless tobacco: Never Used  . Alcohol Use: Yes     Comment: Beer once a year on her birthday.  . Drug Use: No  . Sexual Activity: No   Other Topics Concern  . None   Social History Narrative  . None   Review of Systems:  Constitutional:  Denies fever, chills, diaphoresis, appetite change and fatigue.   HEENT:  Denies congestion, sore throat  Respiratory:  Occasional non-productive cough. Denies SOB, DOE, and wheezing.   Cardiovascular:  Denies chest pain, palpitations, and leg swelling.   Gastrointestinal:  Denies nausea, vomiting, abdominal  pain, diarrhea, constipation.  Genitourinary:  Denies dysuria, urgency, frequency, hematuria, flank pain and difficulty urinating.   Musculoskeletal:  Denies myalgias, back pain, joint swelling, arthralgias and gait problem.   Skin:  Denies pallor, rash and wound.   Neurological:  Near syncope last month. Denies dizziness, seizures, syncope, weakness, light-headedness, numbness and headaches.    Objective:  Physical Exam: Filed Vitals:   07/08/13 1012  BP: 116/74  Pulse: 62  Temp: 97.7 F (36.5 C)  TempSrc: Oral  Height: 4' 10.5" (1.486 m)  Weight: 118 lb 14.4 oz (53.933 kg)  SpO2: 97%   Vitals reviewed. General: sitting in rolling chair, NAD  HEENT: b/l cataracts, left pupil complete white opacification, right pupil with overlying white layer. B/l blindness  Cardiac: RRR, +3/6 SEM heard loudest L lower and mid sternal border  Pulm: clear to auscultation bilaterally, no wheezes, rales, or rhonchi  Abd: soft, nontender, nondistended, BS present  Ext: warm and well perfused, no pedal edema, +2DP B/L, thick toe nails painted with nail polish but hardened and long (family unable to cut) and painful at times Skin; dry  Neuro: hx of down syndrome, sometimes difficult to understand, alert and oriented X3, cranial nerves II-XII grossly intact, follows commands, strength and sensation to light touch equal in bilateral upper and lower extremities Extremities. Negative romberg's. Gait assessed: stable but slow, small steps, responds to vocal cues for direction  Assessment & Plan:  Discussed with Dr. Dalphine Handing Flu vaccine today Repeat EKG: improved from prior Podiatry referral

## 2013-07-14 NOTE — Progress Notes (Signed)
Case discussed with Dr. Qureshi at the time of the visit.  We reviewed the resident's history and exam and pertinent patient test results.  I agree with the assessment, diagnosis, and plan of care documented in the resident's note. 

## 2013-08-25 NOTE — Addendum Note (Signed)
Addended by: Neomia Dear on: 08/25/2013 03:24 PM   Modules accepted: Orders

## 2013-10-14 ENCOUNTER — Encounter: Payer: Medicaid Other | Admitting: Internal Medicine

## 2013-10-21 ENCOUNTER — Encounter: Payer: Medicaid Other | Admitting: Internal Medicine

## 2014-11-02 ENCOUNTER — Encounter: Payer: Self-pay | Admitting: *Deleted

## 2015-02-23 ENCOUNTER — Telehealth: Payer: Self-pay | Admitting: Internal Medicine

## 2015-02-23 NOTE — Telephone Encounter (Signed)
Call to patient to confirm appointment for 02/24/15 at 2:45 lmtcb

## 2015-02-24 ENCOUNTER — Ambulatory Visit (INDEPENDENT_AMBULATORY_CARE_PROVIDER_SITE_OTHER): Payer: Medicaid Other | Admitting: Internal Medicine

## 2015-02-24 VITALS — BP 138/80 | HR 65 | Temp 97.8°F | Ht 58.5 in | Wt 107.5 lb

## 2015-02-24 DIAGNOSIS — H02841 Edema of right upper eyelid: Secondary | ICD-10-CM

## 2015-02-24 DIAGNOSIS — H540 Blindness, both eyes: Secondary | ICD-10-CM

## 2015-02-24 DIAGNOSIS — Z0000 Encounter for general adult medical examination without abnormal findings: Secondary | ICD-10-CM

## 2015-02-24 DIAGNOSIS — M79675 Pain in left toe(s): Secondary | ICD-10-CM | POA: Diagnosis not present

## 2015-02-24 DIAGNOSIS — M79676 Pain in unspecified toe(s): Secondary | ICD-10-CM

## 2015-02-24 DIAGNOSIS — H02843 Edema of right eye, unspecified eyelid: Secondary | ICD-10-CM | POA: Insufficient documentation

## 2015-02-24 MED ORDER — ERYTHROMYCIN 5 MG/GM OP OINT: 1.0000 "application " | TOPICAL_OINTMENT | Freq: Four times a day (QID) | OPHTHALMIC | Status: DC

## 2015-02-24 NOTE — Patient Instructions (Signed)
1. Schedule a follow up for 6 month unless you need to see us sooner.   2. Please take all medications as previously prescribed with the following changes:  Use erythromycin ointment in the right eye fours times daily. Please see eye doctor.   Someone will call you regarding foot doctor.   3. If you have worsening of your symptoms or new symptoms arise, please call the clinic [phone] or go to the ER immediately if symptoms are severe.  You have done a great job in taking all your medications. Please continue to do this.

## 2015-03-01 NOTE — Progress Notes (Signed)
   Subjective:   Patient ID: Collier BullockDarmecia T Fauth female   DOB: Dec 27, 1955 59 y.o.   MRN: [id number]  HPI: Ms. Collier BullockDarmecia T Perri is a very pleasant 59 y.o. female w/ PMHx of HLD, bilateral blindness, and Down Syndrome, presents to the clinic today for a follow-up visit. She has no specific complaints, presents w/ her caretaker. She mentions some thickening in her left great toe that causes her some discomfort at times. This has been an issue for quite some time, accompanied by darkening and involvement of the cuticle. The caretaker also notes some swelling of her right eyelid that has occurred recently. patient is blind 2/2 bilateral corneal clouding and cataracts. The patient denies pain.   Past Medical History  Diagnosis Date  . Hyperlipidemia   . Blindness   . Down syndrome   . Syncope Oct 2012    EKG showed Bradycardia in 50's otherwise normal.   Current Outpatient Prescriptions  Medication Sig Dispense Refill  . erythromycin [facility](ROMYCIN) ophthalmic ointment Place 1 application into the right eye 4 (four) times daily. Use for 7 days, then STOP. 3.5 g 0   No current facility-administered medications for this visit.    Review of Systems:  General: Denies fever, diaphoresis, appetite change, and fatigue.  Respiratory: Denies SOB, cough, and wheezing.   Cardiovascular: Denies chest pain and palpitations.  Gastrointestinal: Denies nausea, vomiting, abdominal pain, and diarrhea Musculoskeletal: Denies myalgias, arthralgias, back pain, and gait problem.  Neurological: Denies dizziness, syncope, weakness, lightheadedness, and headaches.  Psychiatric/Behavioral: Denies mood changes, sleep disturbance, and agitation.   Objective:   Physical Exam: Filed Vitals:   02/24/15 1529  BP: 138/80  Pulse: 65  Temp: 97.8 F (36.6 C)  TempSrc: Oral  Height: 4' 10.5" (1.486 m)  Weight: 107 lb 8 oz (48.762 kg)  SpO2: 100%    General: Small female, alert, cooperative, NAD.  HEENT: PERRL, EOMI.  Moist mucus membranes. Bilateral corneal clouding. Right upper eyelid w/ mild ertythema and swelling. Some scant serous discharge from the eye.  Neck: Full range of motion without pain, supple, no lymphadenopathy or carotid bruits Lungs: Clear to ascultation bilaterally, normal work of respiration, no wheezes, rales, rhonchi Heart: RRR, no murmurs, gallops, or rubs Abdomen: Soft, non-tender, non-distended, BS + Extremities: No cyanosis, clubbing, or edema Neurologic: Alert & oriented x1, strength grossly intact.   Assessment & Plan:   Please see problem based assessment and plan.

## 2015-03-01 NOTE — Assessment & Plan Note (Signed)
Will send for mammogram today.

## 2015-03-01 NOTE — Assessment & Plan Note (Signed)
Patient w/ new erythema and swelling of the right upper eyelid, caretaker states this is new. Blind at baseline. Very likely a viral conjunctivitis by appearance, however, swelling of the lid concerning for possible bacterial infection/cellulitis. Scant serous discharge.  -Erythromycin ointment for 7 days -Ophthalmology referral for continued evaluation of eyes

## 2015-03-01 NOTE — Assessment & Plan Note (Signed)
Patient w/ enlarged, overgrown left great toe, discolored, caretaker notes this causes her discomfort. Very likely onychomycosis, as well as complicated nail bed.  -Podiatry referral for further management

## 2015-03-03 NOTE — Progress Notes (Signed)
Internal Medicine Clinic Attending  Case discussed with Dr. Jones soon after the resident saw the patient.  We reviewed the resident's history and exam and pertinent patient test results.  I agree with the assessment, diagnosis, and plan of care documented in the resident's note. 

## 2015-03-10 ENCOUNTER — Ambulatory Visit: Payer: Medicaid Other

## 2015-03-15 ENCOUNTER — Ambulatory Visit
Admission: RE | Admit: 2015-03-15 | Discharge: 2015-03-15 | Disposition: A | Discharge status: Home / Self Care | Payer: Medicaid Other | Source: Ambulatory Visit | Attending: Internal Medicine | Admitting: Internal Medicine

## 2015-03-15 DIAGNOSIS — Z0000 Encounter for general adult medical examination without abnormal findings: Secondary | ICD-10-CM

## 2015-04-13 NOTE — Addendum Note (Signed)
Addended by: Neomia Dear on: 04/13/2015 07:18 PM   Modules accepted: Orders

## 2016-06-19 ENCOUNTER — Other Ambulatory Visit: Payer: Self-pay | Admitting: Internal Medicine

## 2016-06-19 DIAGNOSIS — Z1231 Encounter for screening mammogram for malignant neoplasm of breast: Secondary | ICD-10-CM

## 2016-07-28 ENCOUNTER — Ambulatory Visit
Admission: RE | Admit: 2016-07-28 | Discharge: 2016-07-28 | Disposition: A | Discharge status: Home / Self Care | Payer: Medicaid Other | Source: Ambulatory Visit | Attending: Internal Medicine | Admitting: Internal Medicine

## 2016-07-28 DIAGNOSIS — Z1231 Encounter for screening mammogram for malignant neoplasm of breast: Secondary | ICD-10-CM

## 2017-03-16 ENCOUNTER — Encounter: Payer: Self-pay | Admitting: Family Medicine

## 2017-03-16 ENCOUNTER — Ambulatory Visit (INDEPENDENT_AMBULATORY_CARE_PROVIDER_SITE_OTHER): Payer: Medicaid Other | Admitting: Family Medicine

## 2017-03-16 VITALS — BP 98/64 | HR 57 | Temp 98.4°F | Ht 59.0 in | Wt 100.0 lb

## 2017-03-16 DIAGNOSIS — M79676 Pain in unspecified toe(s): Secondary | ICD-10-CM | POA: Diagnosis not present

## 2017-03-16 DIAGNOSIS — Z13220 Encounter for screening for lipoid disorders: Secondary | ICD-10-CM

## 2017-03-16 DIAGNOSIS — Z114 Encounter for screening for human immunodeficiency virus [HIV]: Secondary | ICD-10-CM

## 2017-03-16 DIAGNOSIS — R011 Cardiac murmur, unspecified: Secondary | ICD-10-CM

## 2017-03-16 DIAGNOSIS — Z7189 Other specified counseling: Secondary | ICD-10-CM | POA: Diagnosis not present

## 2017-03-16 DIAGNOSIS — L602 Onychogryphosis: Secondary | ICD-10-CM

## 2017-03-16 DIAGNOSIS — Z0000 Encounter for general adult medical examination without abnormal findings: Secondary | ICD-10-CM | POA: Diagnosis not present

## 2017-03-16 DIAGNOSIS — Z1159 Encounter for screening for other viral diseases: Secondary | ICD-10-CM

## 2017-03-16 DIAGNOSIS — Z124 Encounter for screening for malignant neoplasm of cervix: Secondary | ICD-10-CM

## 2017-03-16 NOTE — Patient Instructions (Signed)
It was a pleasure to see you today! Thank you for choosing [facility] for your primary care. Yvonne Tyler was seen to establish care and toe nail issue. We have made you another referral to podiatry for your nails. Please get your blood lab work today. We will contact you with your results. Please review the health care power of attorney form and bring it back with you at your next visit. Come back to the clinic if you develop any worsening pain with your toe, bleeding, fevers chills.   Best,  Thomes DinningBrad Thompson, MD, MS FAMILY MEDICINE RESIDENT - PGY1 03/16/2017 12:01 PM  Advance Directive Advance directives are legal documents that let you make choices ahead of time about your health care and medical treatment in case you become unable to communicate for yourself. Advance directives are a way for you to communicate your wishes to family, friends, and health care providers. This can help convey your decisions about end-of-life care if you become unable to communicate. Discussing and writing advance directives should happen over time rather than all at once. Advance directives can be changed depending on your situation and what you want, even after you have signed the advance directives. If you do not have an advance directive, some states assign family decision makers to act on your behalf based on how closely you are related to them. Each state has its own laws regarding advance directives. You may want to check with your health care provider, attorney, or state representative about the laws in your state. There are different types of advance directives, such as:  Medical power of attorney.  Living will.  Do not resuscitate (DNR) or do not attempt resuscitation (DNAR) order.  Health care proxy and medical power of attorney A health care proxy, also called a health care agent, is a person who is appointed to make medical decisions for you in cases in which you are unable to make the  decisions yourself. Generally, people choose someone they know well and trust to represent their preferences. Make sure to ask this person for an agreement to act as your proxy. A proxy may have to exercise judgment in the event of a medical decision for which your wishes are not known. A medical power of attorney is a legal document that names your health care proxy. Depending on the laws in your state, after the document is written, it may also need to be:  Signed.  Notarized.  Dated.  Copied.  Witnessed.  Incorporated into your medical record.  You may also want to appoint someone to manage your financial affairs in a situation in which you are unable to do so. This is called a durable power of attorney for finances. It is a separate legal document from the durable power of attorney for health care. You may choose the same person or someone different from your health care proxy to act as your agent in financial matters. If you do not appoint a proxy, or if there is a concern that the proxy is not acting in your best interests, a court-appointed guardian may be designated to act on your behalf. Living will A living will is a set of instructions documenting your wishes about medical care when you cannot express them yourself. Health care providers should keep a copy of your living will in your medical record. You may want to give a copy to family members or friends. To alert caregivers in case of an emergency, you can place  a card in your wallet to let them know that you have a living will and where they can find it. A living will is used if you become:  Terminally ill.  Incapacitated.  Unable to communicate or make decisions.  Items to consider in your living will include:  The use or non-use of life-sustaining equipment, such as dialysis machines and breathing machines (ventilators).  A DNR or DNAR order, which is the instruction not to use cardiopulmonary resuscitation (CPR) if  breathing or heartbeat stops.  The use or non-use of tube feeding.  Withholding of food and fluids.  Comfort (palliative) care when the goal becomes comfort rather than a cure.  Organ and tissue donation.  A living will does not give instructions for distributing your money and property if you should pass away. It is recommended that you seek the advice of a lawyer when writing a will. Decisions about taxes, beneficiaries, and asset distribution will be legally binding. This process can relieve your family and friends of any concerns surrounding disputes or questions that may come up about the distribution of your assets. DNR or DNAR A DNR or DNAR order is a request not to have CPR in the event that your heart stops beating or you stop breathing. If a DNR or DNAR order has not been made and shared, a health care provider will try to help any patient whose heart has stopped or who has stopped breathing. If you plan to have surgery, talk with your health care provider about how your DNR or DNAR order will be followed if problems occur. Summary  Advance directives are the legal documents that allow you to make choices ahead of time about your health care and medical treatment in case you become unable to communicate for yourself.  The process of discussing and writing advance directives should happen over time. You can change the advance directives, even after you have signed them.  Advance directives include DNR or DNAR orders, living wills, and designating an agent as your medical power of attorney. This information is not intended to replace advice given to you by your health care provider. Make sure you discuss any questions you have with your health care provider. Document Released: 11/21/2007 Document Revised: 07/03/2016 Document Reviewed: 07/03/2016 Elsevier Interactive Patient Education  2017 [employer].

## 2017-03-16 NOTE — Assessment & Plan Note (Signed)
Discussed need for AD/HPA. G[place] paperwork to [place], primary care giver. Gave instructions w/ AVS.

## 2017-03-16 NOTE — Assessment & Plan Note (Signed)
Painful toe w/ donning/doffing shoe. Referred to podiatry.

## 2017-03-16 NOTE — Assessment & Plan Note (Signed)
Routine screening to establish baseline

## 2017-03-16 NOTE — Assessment & Plan Note (Signed)
Low risk one time screening

## 2017-03-16 NOTE — Progress Notes (Signed)
    Subjective:  Yvonne Tyler is a 61 y.o. female who [facility] to establish care with a chief complaint of toe discomfort.   HPI: Pt is here to establish care. She is with her cousin Suzette BattiestVeronica, her primary care giver. Suzette BattiestVeronica is not yet established as the HPA. 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 H/o syncope 6 years, had not experienced any syncope since. Pt reports having right great toe nail growing backwards. No symptoms of fever, bleeding, chills, toe discharge. Pt has been to podiatry, but denied b/c medicaid will not cover it. Pt has not had nails cut in 1 year. No history of falls or broken bones. Pt reports discomfort with toe, especially when donning/doffing and wearing shoe. Care giver reports that she is walking normally for her, but she is guided due to blindness.   Reviewed and updated in chart for the following: PMH, medications.   Cervical screening: Reports having extreme pain and discomfort with pap smears. Pt is not sexually active.   ROS: as per hpi.   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 Minimal vision in either eye.  CV: Grade III/VI systolic murmur heard at the left lower sternal border Pulm: NWOB, CTAB with no crackles, wheezes, or rhonchi GI: Soft, Nontender, Nondistended. MSK: Great toe has large, thick fungating, retroverted nail. No redness, discharge, open lesion, blood. Remaining toe nails have grown, curled over the tip of each toe. Lower extremity has no edema, cyanosis,  Skin: warm, dry Psych: alert and oriented, hard to understand at times, but vocal and communicative  No results found for this or any previous visit (from the past 72  hour(s)).   Assessment/Plan:  Healthcare maintenance Getting baseline CMP.   Onychogryphosis Painful toe w/ donning/doffing shoe. Referred to podiatry.   Screening for HIV without presence of risk factors Low risk, one time screening  Lipid screening Routine screening to establish baseline  Encounter for hepatitis C screening test for low risk patient Low risk one time screening  Advanced directives, counseling/discussion Discussed need for AD/HPA. Gave paperwork to [place], primary care giver. Gave instructions w/ AVS.    Thomes DinningBrad Chiron Campione, MD, MS FAMILY MEDICINE RESIDENT - PGY1 03/16/2017 12:28 PM

## 2017-03-16 NOTE — Progress Notes (Signed)
estan

## 2017-03-16 NOTE — Assessment & Plan Note (Addendum)
Getting baseline CMP.

## 2017-03-16 NOTE — Assessment & Plan Note (Signed)
Low risk, one time screening

## 2017-03-17 LAB — HIV ANTIBODY (ROUTINE TESTING W REFLEX): HIV Screen 4th Generation wRfx: NONREACTIVE

## 2017-03-17 LAB — CMP AND LIVER
ALT: 11 IU/L (ref 0–32)
AST: 19 IU/L (ref 0–40)
Albumin: 4 g/dL (ref 3.6–4.8)
Alkaline Phosphatase: 50 IU/L (ref 39–117)
BUN: 16 mg/dL (ref 8–27)
Bilirubin Total: 0.6 mg/dL (ref 0.0–1.2)
Bilirubin, Direct: 0.16 mg/dL (ref 0.00–0.40)
CO2: 22 mmol/L (ref 20–29)
CREATININE: 0.92 mg/dL (ref 0.57–1.00)
Calcium: 8.6 mg/dL — ABNORMAL LOW (ref 8.7–10.3)
Chloride: 105 mmol/L (ref 96–106)
GFR calc Af Amer: 78 mL/min/{1.73_m2} (ref >59)
GFR, EST NON AFRICAN AMERICAN: 67 mL/min/{1.73_m2} (ref >59)
GLUCOSE: 88 mg/dL (ref 65–99)
POTASSIUM: 4 mmol/L (ref 3.5–5.2)
Sodium: 143 mmol/L (ref 134–144)
Total Protein: 7.4 g/dL (ref 6.0–8.5)

## 2017-03-17 LAB — LIPID PANEL
CHOL/HDL RATIO: 2.6 ratio (ref 0.0–4.4)
Cholesterol, Total: 178 mg/dL (ref 100–199)
HDL: 69 mg/dL (ref >39)
LDL CALC: 98 mg/dL (ref 0–99)
TRIGLYCERIDES: 56 mg/dL (ref 0–149)
VLDL Cholesterol Cal: 11 mg/dL (ref 5–40)

## 2017-03-17 LAB — HEPATITIS C ANTIBODY: HEP C VIRUS AB: 0.5 {s_co_ratio} (ref 0.0–0.9)

## 2017-03-19 ENCOUNTER — Telehealth: Payer: Self-pay | Admitting: Family Medicine

## 2017-03-19 NOTE — Telephone Encounter (Signed)
Family Medicine Telephone note  Called patient and informed her of test results on behalf of Dr. Janee Mornhompson. Explained that her HIV/HEP C tests came back (-). Explained that lipid panel looked good. Explained that all of her bmp was within normal limits. All questions answered.  Yvonne BuddyJacob Elfreida Heggs MD PGY-1 Family Medicine Resident

## 2017-05-01 ENCOUNTER — Ambulatory Visit (INDEPENDENT_AMBULATORY_CARE_PROVIDER_SITE_OTHER): Payer: Medicaid Other | Admitting: Podiatry

## 2017-05-01 ENCOUNTER — Encounter: Payer: Self-pay | Admitting: Podiatry

## 2017-05-01 VITALS — BP 111/76 | HR 57

## 2017-05-01 DIAGNOSIS — B351 Tinea unguium: Secondary | ICD-10-CM

## 2017-05-01 DIAGNOSIS — M79674 Pain in right toe(s): Secondary | ICD-10-CM

## 2017-05-01 NOTE — Progress Notes (Signed)
   Subjective:    Patient ID: Yvonne Tyler, female    DOB: July 14, 1956, 61 y.o.   MRN: [id number]  HPI this patient presents the office with chief complaint of painful long nails, especially  a thickened painful big toenail, right foot.  Patient states that the nail on her right great toe is painful walking and wearing her shoes.  Patient is unable to self treat.  She presents the office today for an evaluation and treatment of her nails especially her right big toenail.    Review of Systems  All other systems reviewed and are negative.      Objective:   Physical Exam GENERAL APPEARANCE: Alert, conversant. Appropriately groomed. No acute distress.  VASCULAR: Pedal pulses are  palpable at  [facility].  Capillary refill time is immediate to all digits,  Normal temperature gradient.  Digital hair growth is present bilateral  NEUROLOGIC: LOPS not performed on this patient.  MUSCULOSKELETAL: acceptable muscle strength, tone and stability bilateral.  HAV 1st MPJ  B/L.  Hammer toe  B/l.  NAILS  thick disfigured discolored nails with subungual debris noted right hallux.  No evidence of bacterial infection or drainage DERMATOLOGIC: skin color, texture, and turgor are within normal limits.  No preulcerative lesions or ulcers  are seen, no interdigital maceration noted.  No open lesions present.   No drainage noted.         Assessment & Plan:  Onychomycosis  Right hallux   IE  Debride right hallux toenail  RTC 3-4 months.   Helane GuntherGregory Nimco Bivens DPM

## 2017-07-23 ENCOUNTER — Other Ambulatory Visit: Payer: Self-pay | Admitting: Family Medicine

## 2017-07-23 DIAGNOSIS — Z1231 Encounter for screening mammogram for malignant neoplasm of breast: Secondary | ICD-10-CM

## 2017-08-20 ENCOUNTER — Ambulatory Visit
Admission: RE | Admit: 2017-08-20 | Discharge: 2017-08-20 | Disposition: A | Discharge status: Home / Self Care | Payer: Medicaid Other | Source: Ambulatory Visit | Attending: Family Medicine | Admitting: Family Medicine

## 2017-08-20 DIAGNOSIS — Z1231 Encounter for screening mammogram for malignant neoplasm of breast: Secondary | ICD-10-CM

## 2017-08-31 ENCOUNTER — Ambulatory Visit: Payer: Medicaid Other | Admitting: Podiatry

## 2017-10-05 ENCOUNTER — Ambulatory Visit: Payer: Medicaid Other | Admitting: Podiatry

## 2017-10-05 ENCOUNTER — Encounter: Payer: Self-pay | Admitting: Podiatry

## 2017-10-05 DIAGNOSIS — B351 Tinea unguium: Secondary | ICD-10-CM | POA: Diagnosis not present

## 2017-10-05 DIAGNOSIS — M79676 Pain in unspecified toe(s): Secondary | ICD-10-CM | POA: Diagnosis not present

## 2017-10-05 DIAGNOSIS — M79674 Pain in right toe(s): Principal | ICD-10-CM

## 2017-10-05 NOTE — Progress Notes (Signed)
Complaint:  Visit Type: Patient returns to my office for continued preventative foot care services. Complaint: Patient states" my nails have grown long and thick and become painful to walk and wear shoes"  The patient presents for preventative foot care services. No changes to ROS  Podiatric Exam: Vascular: dorsalis pedis and posterior tibial pulses are palpable bilateral. Capillary return is immediate. Temperature gradient is WNL. Skin turgor WNL  Sensorium: Normal Semmes Weinstein monofilament test. Normal tactile sensation bilaterally. Nail Exam: Pt has thick disfigured discolored nails with subungual debris noted bilateral entire nail hallux through fifth toenails Ulcer Exam: There is no evidence of ulcer or pre-ulcerative changes or infection. Orthopedic Exam: Muscle tone and strength are WNL. No limitations in general ROM. No crepitus or effusions noted. HAV  B/L.  Hammer toes  B/L. Skin: No Porokeratosis. No infection or ulcers  Diagnosis:  Onychomycosis, , Pain in right toe, pain in left toes  Treatment & Plan Procedures and Treatment: Consent by patient was obtained for treatment procedures.   Debridement of mycotic and hypertrophic toenails, 1 through 5 bilateral and clearing of subungual debris. No ulceration, no infection noted.  Return Visit-Office Procedure: Patient instructed to return to the office for a follow up visit 4 months for continued evaluation and treatment.    Helane GuntherGregory Trystin Hargrove DPM

## 2018-01-04 ENCOUNTER — Ambulatory Visit: Payer: Medicaid Other | Admitting: Podiatry

## 2018-07-17 ENCOUNTER — Ambulatory Visit: Payer: Medicaid Other | Admitting: Podiatry

## 2018-08-13 ENCOUNTER — Other Ambulatory Visit: Payer: Self-pay | Admitting: Family Medicine

## 2018-08-13 DIAGNOSIS — Z1231 Encounter for screening mammogram for malignant neoplasm of breast: Secondary | ICD-10-CM

## 2018-09-13 ENCOUNTER — Encounter: Payer: Self-pay | Admitting: Podiatry

## 2018-09-13 ENCOUNTER — Ambulatory Visit (INDEPENDENT_AMBULATORY_CARE_PROVIDER_SITE_OTHER): Payer: Medicaid Other | Admitting: Podiatry

## 2018-09-13 DIAGNOSIS — B351 Tinea unguium: Secondary | ICD-10-CM | POA: Diagnosis not present

## 2018-09-13 DIAGNOSIS — M79674 Pain in right toe(s): Secondary | ICD-10-CM

## 2018-09-13 NOTE — Progress Notes (Signed)
Complaint:  Visit Type: Patient returns to my office for continued preventative foot care services. Complaint: Patient states" my nails have grown long and thick and become painful to walk and wear shoes"  The patient presents for preventative foot care services. No changes to ROS.  Patient has not been seen in about 1 year.  Podiatric Exam: Vascular: dorsalis pedis and posterior tibial pulses are palpable bilateral. Capillary return is immediate. Temperature gradient is WNL. Skin turgor WNL  Sensorium: Normal Semmes Weinstein monofilament test. Normal tactile sensation bilaterally. Nail Exam: Pt has thick disfigured discolored nails with subungual debris noted bilateral entire nail hallux through fifth toenails Ulcer Exam: There is no evidence of ulcer or pre-ulcerative changes or infection. Orthopedic Exam: Muscle tone and strength are WNL. No limitations in general ROM. No crepitus or effusions noted. HAV  B/L.  Hammer toes  B/L. Skin: No Porokeratosis. No infection or ulcers  Diagnosis:  Onychomycosis, , Pain in right toe, pain in left toes  Treatment & Plan Procedures and Treatment: Consent by patient was obtained for treatment procedures.   Debridement of mycotic and hypertrophic toenails, 1 through 5 bilateral and clearing of subungual debris. No ulceration, no infection noted.  Return Visit-Office Procedure: Patient instructed to return to the office for a follow up visit 4 months for continued evaluation and treatment.    Helane GuntherGregory Beronica Lansdale DPM

## 2018-09-23 ENCOUNTER — Ambulatory Visit
Admission: RE | Admit: 2018-09-23 | Discharge: 2018-09-23 | Disposition: A | Discharge status: Home / Self Care | Payer: Medicaid Other | Source: Ambulatory Visit | Attending: Family Medicine | Admitting: Family Medicine

## 2018-09-23 DIAGNOSIS — Z1231 Encounter for screening mammogram for malignant neoplasm of breast: Secondary | ICD-10-CM

## 2018-11-11 ENCOUNTER — Emergency Department (HOSPITAL_COMMUNITY)
Admission: EM | Admit: 2018-11-11 | Discharge: 2018-11-11 | Disposition: A | Discharge status: Home / Self Care | Payer: Medicaid Other | Attending: Emergency Medicine | Admitting: Emergency Medicine

## 2018-11-11 ENCOUNTER — Other Ambulatory Visit: Payer: Self-pay

## 2018-11-11 DIAGNOSIS — R55 Syncope and collapse: Secondary | ICD-10-CM | POA: Diagnosis present

## 2018-11-11 LAB — CBC WITH DIFFERENTIAL/PLATELET
Abs Immature Granulocytes: 0.01 10*3/uL (ref 0.00–0.07)
Basophils Absolute: 0.1 10*3/uL (ref 0.0–0.1)
Basophils Relative: 1 %
Eosinophils Absolute: 0 10*3/uL (ref 0.0–0.5)
Eosinophils Relative: 0 %
HCT: 42.7 % (ref 36.0–46.0)
Hemoglobin: 13.9 g/dL (ref 12.0–15.0)
IMMATURE GRANULOCYTES: 0 %
LYMPHS PCT: 23 %
Lymphs Abs: 1.6 10*3/uL (ref 0.7–4.0)
MCH: 35.6 pg — ABNORMAL HIGH (ref 26.0–34.0)
MCHC: 32.6 g/dL (ref 30.0–36.0)
MCV: 109.5 fL — ABNORMAL HIGH (ref 80.0–100.0)
Monocytes Absolute: 0.4 10*3/uL (ref 0.1–1.0)
Monocytes Relative: 7 %
Neutro Abs: 4.6 10*3/uL (ref 1.7–7.7)
Neutrophils Relative %: 69 %
Platelets: 217 10*3/uL (ref 150–400)
RBC: 3.9 MIL/uL (ref 3.87–5.11)
RDW: 12.7 % (ref 11.5–15.5)
WBC: 6.7 10*3/uL (ref 4.0–10.5)
nRBC: 0 % (ref 0.0–0.2)

## 2018-11-11 LAB — BASIC METABOLIC PANEL
Anion gap: 12 (ref 5–15)
BUN: 15 mg/dL (ref 8–23)
CO2: 23 mmol/L (ref 22–32)
Calcium: 8.9 mg/dL (ref 8.9–10.3)
Chloride: 102 mmol/L (ref 98–111)
Creatinine, Ser: 1.02 mg/dL — ABNORMAL HIGH (ref 0.44–1.00)
GFR calc Af Amer: >60 mL/min (ref >60)
GFR calc non Af Amer: 59 mL/min — ABNORMAL LOW (ref >60)
Glucose, Bld: 100 mg/dL — ABNORMAL HIGH (ref 70–99)
Potassium: 4.1 mmol/L (ref 3.5–5.1)
SODIUM: 137 mmol/L (ref 135–145)

## 2018-11-11 MED ORDER — SODIUM CHLORIDE 0.9 % IV BOLUS
1000.0000 mL | Freq: Once | INTRAVENOUS | Status: AC
  Administered 2018-11-11: 1000 mL via INTRAVENOUS

## 2018-11-11 NOTE — ED Provider Notes (Signed)
[facility]-EMERGENCY DEPT Provider Note   CSN: [id number] Arrival date & time: 11/11/18  4098    History   Chief Complaint Chief Complaint  Patient presents with  . Loss of Consciousness    HPI Yvonne Tyler is a 63 y.o. female.     The history is provided by the patient.  Near Syncope  This is a new problem. The current episode started less than 1 hour ago. The problem occurs rarely. The problem has been resolved. Pertinent negatives include no chest pain, no abdominal pain, no headaches and no shortness of breath. Nothing aggravates the symptoms. Relieved by: fluids with EMS. She has tried nothing for the symptoms. The treatment provided no relief.    Past Medical History:  Diagnosis Date  . Blindness   . Down syndrome   . Hyperlipidemia   . Syncope Oct 2012   EKG showed Bradycardia in 50's otherwise normal.    Patient Active Problem List   Diagnosis Date Noted  . 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 Encounter for medical examination to establish care 03/16/2017  . Encounter for hepatitis C screening test for low risk patient 03/16/2017  . Screening for HIV without presence of risk factors 03/16/2017  . Lipid screening 03/16/2017  . Screening for cervical cancer 03/16/2017  . Advanced directives, counseling/discussion 03/16/2017  . Healthcare maintenance 02/24/2015  . Heart murmur 05/09/2012  . BLINDNESS, LEGAL, [place] DEFINITION 08/03/2006  . DOWN SYNDROME 08/03/2006    No past surgical history on file.   OB History   No obstetric history on file.      Home Medications    Prior to Admission medications   Not on File    Family History No family history on file.  Social History Social History   Tobacco Use  . Smoking status: Never Smoker  . Smokeless tobacco: Never Used  Substance Use Topics  . Alcohol use: Yes    Comment: Beer once a year on her birthday.  . Drug use: No     Allergies   Patient has no known allergies.   Review of Systems Review of Systems  Constitutional: Negative for chills and fever.  HENT: Negative for ear pain and sore throat.   Eyes: Negative for pain and visual disturbance.  Respiratory: Negative for cough and shortness of breath.   Cardiovascular: Positive for near-syncope. Negative for chest pain and palpitations.  Gastrointestinal: Negative for abdominal pain and vomiting.  Genitourinary: Negative for dysuria and hematuria.  Musculoskeletal: Negative for arthralgias and back pain.  Skin: Negative for color change and rash.  Neurological: Negative for seizures, syncope and headaches.  All other systems reviewed and are negative.    Physical Exam Updated Vital Signs BP 104/69 (BP Location: Right Arm)   Pulse (!) 55   Temp 97.9 F (36.6 C) (Oral)   Resp 16   SpO2 100%   Physical Exam Vitals signs and nursing note reviewed.  Constitutional:      General: She is not in acute distress.    Appearance: She is well-developed. She is not ill-appearing.  HENT:     Head: Normocephalic and atraumatic.     Mouth/Throat:     Mouth: Mucous membranes are moist.  Eyes:     Conjunctiva/sclera: Conjunctivae normal.  Neck:     Musculoskeletal: Normal range of motion and neck supple.  Cardiovascular:     Rate and Rhythm: Normal rate and regular rhythm.     Pulses: Normal pulses.  Heart sounds: Normal heart sounds. No murmur.  Pulmonary:     Effort: Pulmonary effort is normal. No respiratory distress.     Breath sounds: Normal breath sounds.  Abdominal:     General: There is no distension.     Palpations: Abdomen is soft.     Tenderness: There is no abdominal tenderness.  Musculoskeletal:     Right lower leg: No edema.     Left lower leg: No edema.  Skin:    General: Skin is warm and dry.     Capillary Refill: Capillary refill takes less than 2 seconds.  Neurological:     General: No focal deficit present.     Mental Status: She is alert and oriented to person, place,  and time.     Cranial Nerves: No cranial nerve deficit.     Sensory: No sensory deficit.     Motor: No weakness.     Coordination: Coordination normal.     Gait: Gait normal.     Comments: Patient blind at baseline, 5+ out of 5 strength throughout, normal sensation, no drift, normal speech  Psychiatric:        Mood and Affect: Mood normal.      ED Treatments / Results  Labs (all labs ordered are listed, but only abnormal results are displayed) Labs Reviewed  CBC WITH DIFFERENTIAL/PLATELET - Abnormal; Notable for the following components:      Result Value   MCV 109.5 (*)    MCH 35.6 (*)    All other components within normal limits  BASIC METABOLIC PANEL - Abnormal; Notable for the following components:   Glucose, Bld 100 (*)    Creatinine, Ser 1.02 (*)    GFR calc non Af Amer 59 (*)    All other components within normal limits    EKG EKG Interpretation  Date/Time:  Monday November 11 2018 20:24:27 EDT Ventricular Rate:  57 PR Interval:    QRS Duration: 91 QT Interval:  471 QTC Calculation: 459 R Axis:   107 Text Interpretation:  Sinus rhythm Biatrial enlargement Consider right ventricular hypertrophy unchange from prior with j point elevation in inferior leads Confirmed by Virgina Norfolk [phone]) on 11/11/2018 8:57:05 PM   Radiology No results found.  Procedures Procedures (including critical care time)  Medications Ordered in ED Medications  sodium chloride 0.9 % bolus 1,000 mL (0 mLs Intravenous Stopped 11/11/18 2245)     Initial Impression / Assessment and Plan / ED Course  I have reviewed the triage vital signs and the nursing notes.  Pertinent labs & imaging results that were available during my care of the patient were reviewed by me and considered in my medical decision making (see chart for details).     Yvonne Tyler is a 63 year old female with history of blindness who presents to the ED after syncopal event.  Patient with normal vitals.  No fever.   Patient asymptomatic upon arrival.  Patient was having some lightheadedness, nausea and had near syncope.  Denies any chest pain, shortness of breath.  Has not had much to eat or drink today.  No fever, no chills, no sputum production.  States that she felt lightheaded.  Patient with normal exam.  Neurologically intact.  Symptoms have now resolved with IV fluids with EMS.  Lab work here overall reassuring.  No significant electrolyte abnormality, kidney injury.  EKG shows sinus rhythm.  No ischemic changes.  Patient overall asymptomatic.  Well-appearing.  Possible vasovagal event.  Discharged from  the ED in good condition.  Given return precautions.  This chart was dictated using voice recognition software.  Despite best efforts to proofread,  errors can occur which can change the documentation meaning.    Final Clinical Impressions(s) / ED Diagnoses   Final diagnoses:  Near syncope    ED Discharge Orders    None       Virgina Norfolk, DO 11/11/18 2346

## 2018-11-11 NOTE — ED Triage Notes (Signed)
Per EMS, Pt has htx of down syndrome, pt was sitting and had what appeared to a syncopel episode. No changes from pts baseline, no pain reported or other complaints.

## 2018-11-11 NOTE — Discharge Instructions (Signed)
Follow up with primary doctor

## 2019-03-14 ENCOUNTER — Ambulatory Visit: Payer: Medicaid Other | Admitting: Podiatry

## 2019-05-06 ENCOUNTER — Encounter: Payer: Self-pay | Admitting: Podiatry

## 2019-05-06 ENCOUNTER — Other Ambulatory Visit: Payer: Self-pay

## 2019-05-06 ENCOUNTER — Ambulatory Visit (INDEPENDENT_AMBULATORY_CARE_PROVIDER_SITE_OTHER): Payer: Medicaid Other | Admitting: Podiatry

## 2019-05-06 ENCOUNTER — Encounter: Payer: Self-pay | Admitting: Family Medicine

## 2019-05-06 ENCOUNTER — Ambulatory Visit (INDEPENDENT_AMBULATORY_CARE_PROVIDER_SITE_OTHER): Payer: Medicaid Other | Admitting: Family Medicine

## 2019-05-06 VITALS — BP 100/60 | HR 75 | Ht 59.0 in | Wt 105.5 lb

## 2019-05-06 DIAGNOSIS — Z20822 Contact with and (suspected) exposure to covid-19: Secondary | ICD-10-CM

## 2019-05-06 DIAGNOSIS — Q909 Down syndrome, unspecified: Secondary | ICD-10-CM

## 2019-05-06 DIAGNOSIS — Z23 Encounter for immunization: Secondary | ICD-10-CM | POA: Diagnosis not present

## 2019-05-06 DIAGNOSIS — Z20828 Contact with and (suspected) exposure to other viral communicable diseases: Secondary | ICD-10-CM | POA: Diagnosis not present

## 2019-05-06 DIAGNOSIS — B351 Tinea unguium: Secondary | ICD-10-CM | POA: Diagnosis not present

## 2019-05-06 DIAGNOSIS — M79676 Pain in unspecified toe(s): Secondary | ICD-10-CM

## 2019-05-06 DIAGNOSIS — R42 Dizziness and giddiness: Secondary | ICD-10-CM

## 2019-05-06 HISTORY — DX: Contact with and (suspected) exposure to covid-19: Z20.822

## 2019-05-06 HISTORY — DX: Dizziness and giddiness: R42

## 2019-05-06 NOTE — Assessment & Plan Note (Signed)
Patient needs routine lab work including CBC, BMP, TSH, lipid panel will acquire at next visit

## 2019-05-06 NOTE — Assessment & Plan Note (Signed)
Single episode of transient dizziness.  Neurologically intact now.  Not exhibiting any cardiac symptoms.  Will monitor for now.

## 2019-05-06 NOTE — Progress Notes (Signed)
Complaint:  Visit Type: Patient returns to my office for continued preventative foot care services. Complaint: Patient states" my nails have grown long and thick and become painful to walk and wear shoes"  The patient presents for preventative foot care services. No changes to ROS.  Patient presents to the office with her caregiver..  Podiatric Exam: Vascular: dorsalis pedis and posterior tibial pulses are palpable bilateral. Capillary return is immediate. Temperature gradient is WNL. Skin turgor WNL  Sensorium: Normal Semmes Weinstein monofilament test. Normal tactile sensation bilaterally. Nail Exam: Pt has thick disfigured discolored nails with subungual debris noted bilateral entire nail hallux through fifth toenails Ulcer Exam: There is no evidence of ulcer or pre-ulcerative changes or infection. Orthopedic Exam: Muscle tone and strength are WNL. No limitations in general ROM. No crepitus or effusions noted. HAV  B/L.  Hammer toes  B/L. Skin: No Porokeratosis. No infection or ulcers  Diagnosis:  Onychomycosis, , Pain in right toe, pain in left toes  Treatment & Plan Procedures and Treatment: Consent by patient was obtained for treatment procedures.   Debridement of mycotic and hypertrophic toenails, 1 through 5 bilateral and clearing of subungual debris. No ulceration, no infection noted.  Return Visit-Office Procedure: Patient instructed to return to the office for a follow up visit 4 months for continued evaluation and treatment.    Gardiner Barefoot DPM

## 2019-05-06 NOTE — Assessment & Plan Note (Signed)
Sore throat with mild cough, concern for URI versus COVID-19.  Quarantine and return precautions given.

## 2019-05-06 NOTE — Progress Notes (Signed)
    Subjective:  Yvonne Tyler is a 63 y.o. female who presents to the [facility] today with a chief complaint of dizziness.   History obtained from sibling.  Patient history limited due to intellectual disability.   HPI:  Approximately 2 weeks ago, patient was observed to have a staggering gait when rising from seated position.  Patient was awake.  Patient was on the way to the restroom.  Patient did not fall.  Patient no loss of conscious.  Patient went to the restroom and had one episode of diarrhea.  The episode lasted a few seconds.  Patient has not had anything like this before.  She did have an episode of "syncope" back in March 2020 and was seen in the ED for this. However this is reportedly different event the caregiver.  Patient did not endorse any chest pain, shortness of breath, had no fevers.  Patient has congenital blindness.   Sore throat and cough Patient endorsing 4 days of sore throat.  Has also had mild cough with this.  Patient is been afebrile.  Has not had shortness of breath or chest pain.  No known sick exposures.  ROS: Per HPI  PMH: Smoking history reviewed.    Objective:  Physical Exam: BP 100/60   Pulse 75   Ht 4\' 11"  (1.499 m)   Wt 105 lb 8 oz (47.9 kg)   SpO2 97%   BMI 21.31 kg/m   Gen: NAD, resting comfortably, HEENT: No teeth, MMM, cataracts in both eyes CV: RRR with no murmurs appreciated Pulm: NWOB, CTAB with no crackles, wheezes, or rhonchi GI: Normal bowel sounds present. Soft, Nontender, Nondistended. MSK: no edema, cyanosis, or clubbing noted Skin: warm, dry Neuro: grossly normal, moves all extremities Psych: Normal affect and thought content  No results found for this or any previous visit (from the past 72 hour(s)).   Assessment/Plan:  Dizziness Single episode of transient dizziness.  Neurologically intact now.  Not exhibiting any cardiac symptoms.  Will monitor for now.  DOWN SYNDROME Patient needs routine lab work including CBC, BMP,  TSH, lipid panel will acquire at next visit  Suspected Covid-19 Virus Infection Sore throat with mild cough, concern for URI versus COVID-19.  Quarantine and return precautions given.    Lab Orders     Novel Coronavirus, NAA ([facility])  No orders of the defined types were placed in this encounter.     Marny Lowenstein, MD, [place] - PGY2 05/06/2019 5:07 PM

## 2019-05-06 NOTE — Patient Instructions (Signed)
It was a pleasure to see you today! Thank you for choosing [facility] for your primary care. Yvonne Tyler was seen for dizziness.  1.  Dizziness.  Given that patient seems to be doing better, and this was an isolated event.  Would monitor for now.  If patient complains of chest pain, shortness of breath, passes out, feels shaky or vomits these are all reasons to go to the emergency room or come back to clinic for further evaluation.  2.  For the sore throat and cough, will send you for testing COVID-19.  The close testing site is at [place]. [address].  [facility]  You can be tested anytime between 9 AM to 4 PM.  You do not need an appointment for this.  Please try to stay quarantine for up to 14 days, or at least for 2 days after negative cover test or you are symptom-free. Come back to the clinic or go to the emergency department if patient develops severe fever, difficulty breathing, or any other worrisome symptoms.  Best,  Marny Lowenstein, MD, MS FAMILY MEDICINE RESIDENT - PGY3 05/06/2019 4:21 PM

## 2019-05-07 ENCOUNTER — Other Ambulatory Visit: Payer: Self-pay

## 2019-05-07 DIAGNOSIS — Z20822 Contact with and (suspected) exposure to covid-19: Secondary | ICD-10-CM

## 2019-05-09 LAB — NOVEL CORONAVIRUS, NAA: SARS-CoV-2, NAA: NOT DETECTED

## 2019-07-10 ENCOUNTER — Ambulatory Visit (HOSPITAL_COMMUNITY)
Admission: EM | Admit: 2019-07-10 | Discharge: 2019-07-10 | Disposition: A | Discharge status: Home / Self Care | Payer: Medicaid Other | Attending: Internal Medicine | Admitting: Internal Medicine

## 2019-07-10 ENCOUNTER — Encounter (HOSPITAL_COMMUNITY): Payer: Self-pay | Admitting: Emergency Medicine

## 2019-07-10 ENCOUNTER — Other Ambulatory Visit: Payer: Self-pay

## 2019-07-10 DIAGNOSIS — S0083XA Contusion of other part of head, initial encounter: Secondary | ICD-10-CM

## 2019-07-10 DIAGNOSIS — W19XXXA Unspecified fall, initial encounter: Secondary | ICD-10-CM

## 2019-07-10 DIAGNOSIS — S0011XA Contusion of right eyelid and periocular area, initial encounter: Secondary | ICD-10-CM

## 2019-07-10 DIAGNOSIS — H1133 Conjunctival hemorrhage, bilateral: Secondary | ICD-10-CM

## 2019-07-10 DIAGNOSIS — W108XXA Fall (on) (from) other stairs and steps, initial encounter: Secondary | ICD-10-CM | POA: Diagnosis not present

## 2019-07-10 MED ORDER — ACETAMINOPHEN 325 MG PO TABS: 650.0000 mg | ORAL_TABLET | Freq: Four times a day (QID) | ORAL | Status: DC | PRN

## 2019-07-10 NOTE — ED Triage Notes (Signed)
Pt states she fell forward and hit her face. Pt is legally blind. Pt has bilateral black eyes. No LOC.

## 2019-07-10 NOTE — ED Provider Notes (Signed)
[facility]    CSN: [id number] Arrival date & time: 07/10/19  1743      History   Chief Complaint Chief Complaint  Patient presents with  . Fall  . Facial Pain    HPI Yvonne Tyler is a 63 y.o. female who is legally blind comes to the urgent care today to be evaluated following a fall in the early hours of today.  Lives with family members and apparently missed her way and fell down some stairs.  Patient apparently hit her face on the way down the stairs.  According to family members, patient did not lose consciousness after the fall.  She complains of pain  periorbital pain.  She denies any headaches or fuzzy headedness.  Patient is legally blind.  Patient complains of some gluteal pain.  She also had some bleeding from the right nostril.  No difficulty breathing.  No disfigurement of her nasal outline.  Patient lives with family members.  HPI  Past Medical History:  Diagnosis Date  . Blindness   . Down syndrome   . Hyperlipidemia   . Syncope Oct 2012   EKG showed Bradycardia in 50's otherwise normal.    Patient Active Problem List   Diagnosis Date Noted  . Dizziness 05/06/2019  . Suspected COVID-19 virus infection 05/06/2019  . 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 Lipid screening 03/16/2017  . Heart murmur 05/09/2012  . BLINDNESS, LEGAL, [place] DEFINITION 08/03/2006  . DOWN SYNDROME 08/03/2006    History reviewed. No pertinent surgical history.  OB History   No obstetric history on file.      Home Medications    Prior to Admission medications   Not on File    Family History No family history on file.  Social History Social History   Tobacco Use  . Smoking status: Never Smoker  . Smokeless tobacco: Never Used  Substance Use Topics  . Alcohol use: Yes    Comment: Beer once a year on her birthday.  . Drug use: No     Allergies   Patient has no known allergies.   Review of Systems Review of Systems  Constitutional: Negative for activity  change and fatigue.  HENT: Positive for facial swelling. Negative for ear discharge.   Eyes: Positive for redness.  Respiratory: Negative.  Negative for chest tightness, shortness of breath and wheezing.   Cardiovascular: Negative for chest pain and palpitations.  Gastrointestinal: Negative for abdominal pain, diarrhea, nausea and vomiting.  Genitourinary: Negative.   Musculoskeletal: Positive for arthralgias and myalgias. Negative for back pain, joint swelling, neck pain and neck stiffness.  Skin: Positive for color change.  Neurological: Negative for dizziness, seizures, syncope, facial asymmetry, light-headedness, numbness and headaches.  Psychiatric/Behavioral: Negative for confusion and decreased concentration.     Physical Exam Triage Vital Signs ED Triage Vitals [07/10/19 1821]  Enc Vitals Group     BP 115/76     Pulse Rate 63     Resp 18     Temp (!) 97.5 F (36.4 C)     Temp src      SpO2 100 %     Weight      Height      Head Circumference      Peak Flow      Pain Score      Pain Loc      Pain Edu?      Excl. in GC?    No data found.  Updated Vital Signs BP 115/76  Pulse 63   Temp (!) 97.5 F (36.4 C)   Resp 18   SpO2 100%   Visual Acuity Right Eye Distance:   Left Eye Distance:   Bilateral Distance:    Right Eye Near:   Left Eye Near:    Bilateral Near:     Physical Exam Vitals signs and nursing note reviewed.  Constitutional:      General: She is not in acute distress.    Appearance: She is not ill-appearing.  HENT:     Head: Atraumatic.     Right Ear: Tympanic membrane normal.     Left Ear: Tympanic membrane normal.     Nose: No rhinorrhea.     Comments: Dried blood in the right nostril.    Mouth/Throat:     Mouth: Mucous membranes are moist.     Pharynx: No oropharyngeal exudate or posterior oropharyngeal erythema.  Eyes:     Extraocular Movements: Extraocular movements intact.     Comments: Bilateral subconjunctival hematoma.   Corneal haziness which is chronic.  Right periorbital hematoma.  Neck:     Musculoskeletal: Normal range of motion and neck supple. No neck rigidity or muscular tenderness.  Cardiovascular:     Rate and Rhythm: Normal rate and regular rhythm.     Pulses: Normal pulses.  Pulmonary:     Effort: Pulmonary effort is normal. No respiratory distress.     Breath sounds: No stridor. No rhonchi.  Chest:     Chest wall: No tenderness.  Abdominal:     General: Bowel sounds are normal. There is no distension.     Palpations: Abdomen is soft. There is no mass.     Tenderness: There is no abdominal tenderness. There is no guarding or rebound.  Musculoskeletal: Normal range of motion.        General: Signs of injury present. No swelling.     Comments: Tenderness in the right gluteal area.  Full range of motion around the hips bilaterally.  Skin:    General: Skin is warm.     Capillary Refill: Capillary refill takes less than 2 seconds.     Findings: Bruising present.     Comments: Periorbital bruising  Neurological:     General: No focal deficit present.     Mental Status: She is alert and oriented to person, place, and time.     Cranial Nerves: No cranial nerve deficit.     Sensory: No sensory deficit.     Motor: No weakness.     Gait: Gait normal.     Deep Tendon Reflexes: Reflexes normal.        UC Treatments / Results  Labs (all labs ordered are listed, but only abnormal results are displayed) Labs Reviewed - No data to display  EKG   Radiology No results found.  Procedures Procedures (including critical care time)  Medications Ordered in UC Medications - No data to display  Initial Impression / Assessment and Plan / UC Course  I have reviewed the triage vital signs and the nursing notes.  Pertinent labs & imaging results that were available during my care of the patient were reviewed by me and considered in my medical decision making (see chart for details).     1.   Fall with right periorbital hematoma: Tylenol as needed for pain Patient lives with family members to take care of her. I do not have any suspicion of abuse.  Family member with the patient at the time of evaluation  is very caring.  He is very relaxed and is not apprehensive.  2.  Subconjunctival hematoma, bilateral: Family member accompanying the patient was advised to monitor the periorbital bruising as well as the subconjunctival hematoma.  Saline eyedrops could be helpful in keeping the eye moist. Final Clinical Impressions(s) / UC Diagnoses   Final diagnoses:  None   Discharge Instructions   None    ED Prescriptions    None     PDMP not reviewed this encounter.   Chase Picket, MD 07/10/19 [id number]

## 2019-08-15 ENCOUNTER — Other Ambulatory Visit: Payer: Self-pay | Admitting: Family Medicine

## 2019-08-15 DIAGNOSIS — Z1231 Encounter for screening mammogram for malignant neoplasm of breast: Secondary | ICD-10-CM

## 2019-09-05 ENCOUNTER — Ambulatory Visit: Payer: Medicaid Other | Admitting: Podiatry

## 2019-10-07 ENCOUNTER — Other Ambulatory Visit: Payer: Self-pay

## 2019-10-07 ENCOUNTER — Ambulatory Visit
Admission: RE | Admit: 2019-10-07 | Discharge: 2019-10-07 | Disposition: A | Discharge status: Home / Self Care | Payer: Medicaid Other | Source: Ambulatory Visit | Attending: Family Medicine | Admitting: Family Medicine

## 2019-10-07 DIAGNOSIS — Z1231 Encounter for screening mammogram for malignant neoplasm of breast: Secondary | ICD-10-CM

## 2019-10-10 ENCOUNTER — Encounter: Payer: Self-pay | Admitting: *Deleted

## 2019-10-10 NOTE — Progress Notes (Signed)
Letter mailed to patient. Charlotte Fidalgo,CMA  

## 2019-11-08 ENCOUNTER — Emergency Department (HOSPITAL_COMMUNITY): Payer: Medicaid Other

## 2019-11-08 ENCOUNTER — Other Ambulatory Visit: Payer: Self-pay

## 2019-11-08 ENCOUNTER — Emergency Department (HOSPITAL_COMMUNITY)
Admission: EM | Admit: 2019-11-08 | Discharge: 2019-11-08 | Disposition: A | Discharge status: Home / Self Care | Payer: Medicaid Other | Attending: Emergency Medicine | Admitting: Emergency Medicine

## 2019-11-08 DIAGNOSIS — Y939 Activity, unspecified: Secondary | ICD-10-CM | POA: Diagnosis not present

## 2019-11-08 DIAGNOSIS — Y929 Unspecified place or not applicable: Secondary | ICD-10-CM | POA: Insufficient documentation

## 2019-11-08 DIAGNOSIS — W07XXXA Fall from chair, initial encounter: Secondary | ICD-10-CM | POA: Insufficient documentation

## 2019-11-08 DIAGNOSIS — W19XXXA Unspecified fall, initial encounter: Secondary | ICD-10-CM

## 2019-11-08 DIAGNOSIS — Y999 Unspecified external cause status: Secondary | ICD-10-CM | POA: Diagnosis not present

## 2019-11-08 DIAGNOSIS — S0990XA Unspecified injury of head, initial encounter: Secondary | ICD-10-CM | POA: Diagnosis present

## 2019-11-08 DIAGNOSIS — Q909 Down syndrome, unspecified: Secondary | ICD-10-CM | POA: Diagnosis not present

## 2019-11-08 DIAGNOSIS — H548 Legal blindness, as defined in USA: Secondary | ICD-10-CM | POA: Diagnosis not present

## 2019-11-08 NOTE — ED Triage Notes (Signed)
Patient from home via EMS Sitting at dining table, fell backwards out of chair, hit back of head on hard wood floor No pain, no LOC, no headache  Hx: down syndrome, legally blind  AOx4   [name] (Patient's caregiver)  [phone]  110/74 60 HR 16 R 98% RA 124 CBG 98.0 temp

## 2019-11-08 NOTE — Discharge Instructions (Addendum)
Return here as needed for any changes or worsening in your condition.  Follow-up with your primary care doctor.

## 2019-11-08 NOTE — ED Provider Notes (Signed)
[facility]-EMERGENCY DEPT Provider Note   CSN: [id number] Arrival date & time: 11/08/19  1304     History Chief Complaint  Patient presents with  . Fall    Yvonne Tyler is a 64 y.o. female.  HPI Patient presents to the emergency department with a fall prior to arrival.  The patient was in a chair and fell backwards.  Patient did not lose consciousness or have any pain associated with this after her fall.  Patient has been stable otherwise.  Patient unable to give me much history but states that she does not hurt anywhere.    Past Medical History:  Diagnosis Date  . Blindness   . Down syndrome   . Hyperlipidemia   . Syncope Oct 2012   EKG showed Bradycardia in 50's otherwise normal.    Patient Active Problem List   Diagnosis Date Noted  . Dizziness 05/06/2019  . Suspected COVID-19 virus infection 05/06/2019  . 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 Lipid screening 03/16/2017  . Heart murmur 05/09/2012  . BLINDNESS, LEGAL, [place] DEFINITION 08/03/2006  . DOWN SYNDROME 08/03/2006    No past surgical history on file.   OB History   No obstetric history on file.     No family history on file.  Social History   Tobacco Use  . Smoking status: Never Smoker  . Smokeless tobacco: Never Used  Substance Use Topics  . Alcohol use: Yes    Comment: Beer once a year on her birthday.  . Drug use: No    Home Medications Prior to Admission medications   Medication Sig Start Date End Date Taking? Authorizing Provider  acetaminophen (TYLENOL) 325 MG tablet Take 2 tablets (650 mg total) by mouth every 6 (six) hours as needed. Patient not taking: Reported on 11/08/2019 07/10/19   Merrilee Jansky, MD    Allergies    Patient has no known allergies.  Review of Systems   Review of Systems Level 5 caveat applies due to her Down syndrome. Physical Exam Updated Vital Signs BP (!) 96/59   Pulse 64   Temp 98 F (36.7 C) (Oral)   Resp 14   SpO2 99%    Physical Exam Vitals and nursing note reviewed.  Constitutional:      General: She is not in acute distress.    Appearance: She is well-developed.  HENT:     Head: Normocephalic and atraumatic.  Eyes:     Pupils: Pupils are equal, round, and reactive to light.  Cardiovascular:     Rate and Rhythm: Normal rate and regular rhythm.     Heart sounds: Normal heart sounds. No murmur. No friction rub. No gallop.   Pulmonary:     Effort: Pulmonary effort is normal. No respiratory distress.     Breath sounds: Normal breath sounds. No wheezing.  Abdominal:     General: Bowel sounds are normal. There is no distension.     Palpations: Abdomen is soft.     Tenderness: There is no abdominal tenderness.  Musculoskeletal:     Cervical back: Normal, normal range of motion and neck supple.     Thoracic back: Normal.     Lumbar back: Normal.     Right hip: Normal.     Left hip: Normal.  Skin:    General: Skin is warm and dry.     Capillary Refill: Capillary refill takes less than 2 seconds.     Findings: No erythema or rash.  Neurological:  Mental Status: She is alert and oriented to person, place, and time.     Motor: No abnormal muscle tone.     Coordination: Coordination normal.  Psychiatric:        Behavior: Behavior normal.     ED Results / Procedures / Treatments   Labs (all labs ordered are listed, but only abnormal results are displayed) Labs Reviewed - No data to display  EKG None  Radiology CT Head Wo Contrast  Result Date: 11/08/2019 CLINICAL DATA:  64 year old female with acute head injury from fall today. Initial encounter. EXAM: CT HEAD WITHOUT CONTRAST TECHNIQUE: Contiguous axial images were obtained from the base of the skull through the vertex without intravenous contrast. COMPARISON:  06/10/2011 CT FINDINGS: Brain: No evidence of acute infarction, hemorrhage, hydrocephalus, extra-axial collection or mass lesion/mass effect. Mild chronic small-vessel white matter  ischemic changes again noted. Vascular: No hyperdense vessel or unexpected calcification. Skull: Normal. Negative for fracture or focal lesion. Sinuses/Orbits: No acute finding. Other: None. IMPRESSION: 1. No evidence of acute intracranial abnormality. 2. Mild chronic small-vessel white matter ischemic changes. Electronically Signed   By: Margarette Canada M.D.   On: 11/08/2019 14:16    Procedures Procedures (including critical care time)  Medications Ordered in ED Medications - No data to display  ED Course  I have reviewed the triage vital signs and the nursing notes.  Pertinent labs & imaging results that were available during my care of the patient were reviewed by me and considered in my medical decision making (see chart for details).    MDM Rules/Calculators/A&P                      Final Clinical Impression(s) / ED Diagnoses Final diagnoses:  None  Patient has a negative head CT and cervical spine CT.  These were reviewed by me as well.  The patient will be discharged home and advised to return for any worsening her condition.  The patient's vital signs remained stable.  There is no apparent significant injuries noted on the patient's examination.  Rx / DC Orders ED Discharge Orders    None       Dalia Heading, PA-C 11/12/19 2357    Malvin Johns, MD 11/17/19 [phone]

## 2019-12-04 ENCOUNTER — Ambulatory Visit (INDEPENDENT_AMBULATORY_CARE_PROVIDER_SITE_OTHER): Payer: Medicaid Other | Admitting: Family Medicine

## 2019-12-04 ENCOUNTER — Other Ambulatory Visit: Payer: Self-pay

## 2019-12-04 ENCOUNTER — Encounter: Payer: Self-pay | Admitting: Family Medicine

## 2019-12-04 VITALS — BP 100/60 | HR 68 | Temp 99.8°F | Ht 59.0 in | Wt 112.0 lb

## 2019-12-04 DIAGNOSIS — W19XXXA Unspecified fall, initial encounter: Secondary | ICD-10-CM

## 2019-12-04 DIAGNOSIS — H269 Unspecified cataract: Secondary | ICD-10-CM | POA: Diagnosis not present

## 2019-12-04 DIAGNOSIS — T6591XA Toxic effect of unspecified substance, accidental (unintentional), initial encounter: Secondary | ICD-10-CM | POA: Diagnosis not present

## 2019-12-04 DIAGNOSIS — D7589 Other specified diseases of blood and blood-forming organs: Secondary | ICD-10-CM | POA: Diagnosis present

## 2019-12-04 NOTE — Patient Instructions (Addendum)
It was a pleasure seeing you today.  1. For the ingestion of cleaner: it is normal to have a sore throat and stomach after vomiting. Her throat looks good today, it will keep getting better. Since she tolerated water and soup yesterday, she can eat normal food today. She may also have some foamy, soapy diarrhea in a few days, this is expected. If you have any questions you can call Poison Control and ask for nurse Barnett Applebaum, or speak with any representative and give Torsha's name. Poison control phone number [phone].  2. For the falls, I recommend she be seen by our geriatric clinic in our office. They will call you to schedule an appointment.  3. I am checking some blood work today to make sure everything is ok, if anything is abnormal, I will let you know.  4. I recommend you see Dr. Gershon Crane about her cataracts at your earliest convenience  5. You can follow up with Dr. Grandville Silos (her PCP) virtually in the next 2 weeks to 1 month.  6. For back pain, I would wait until next week for her stomach to feel better before using any over the counter pain medication, then avoid ibuprofen, you can use tylenol 500 mg. In the meantime you can use ice to help her back pain.  Be Well!   Preventing Poisoning, Adult Poisoning happens when you eat, drink, touch, or breathe in a harmful substance. Poisoning can be accidental or intentional. Most poisonings happen in the home and involve common household products. Poisoning causes health problems that can range from mild to life-threatening. The health effects of poisoning depend on:  The type of poison.  How long you were exposed to the poison.  How much of the poison you were exposed to. What are some common household poisons? Many household products can cause poisoning. These products include:  Medicines.  Vitamins and minerals.  Herbal supplements.  Cleaning and laundry products.  Paint.  Weed and insect killers.  Beauty products, such  as perfume, hair spray, and fingernail polish.  Alcohol.  Recreational drugs.  Plants, such as philodendron, poinsettia, oleander, castor bean, cactus, and tomato plants.  Batteries.  Automotive products, such as antifreeze.  Gasoline, lighter fluid, and lamp oil. What should I do after being exposed to a poison? If you are exposed to poison, contact your local poison control center right away. Call [phone] (in the [place] to reach the poison control center for your area. A poison control specialist will often give you directions to follow over the phone. Directions may include the following:  If the poison is still in your mouth, remove it.  If you ate or drank the poison, drink a small amount of water or milk.  If the poison is a medicine, keep the medicine container.  If the poison was from fumes, get away from the fumes.  If the poison was inhaled, get fresh air as soon as possible.  If the poison got on your clothes or skin, take off any clothing with the poison on it and rinse the skin with water.  If the poison got in your eyes, rinse your eyes with water. What actions can I take to prevent poisoning?  Medicines  Read labels before using medicines or household products. Leave the original labels on the containers.  When taking a medicine, always keep a light on so that you can clearly see the medicine.  Check the medicine dosage every time.  Get rid of unneeded and outdated  medicines. To get rid of a medicine: ? Do not put the medicine in the trash or flush it down the toilet. ? Follow the disposal instructions that are on the medicine label or that came with the medicine. ? Use your community's drug take-back program to get rid of the medicine. If this option is not available, take the medicine out of the original container and mix it with something that you would not eat, like coffee grounds or kitty litter. Seal the mixture in a bag, can, or other container  and throw it away. Storage  Keep medicines and chemical products in their original containers. Many of these products come in child-safe packaging.  Close containers tightly after using medicine or chemical products.  Keep all dangerous household products in locked cabinets and out of reach of children. General information  Educate others about the dangers of possible poisons.  Do not mix different household chemicals with each other.  Use protective equipment, such as goggles, masks, or gloves, as needed when using chemicals or cleaners.  Install a carbon monoxide detector in your home.  Keep the phone number for your local poison control center posted near your landline phone, or in your cell phone. Make sure everyone in your household knows where to find the number. Where to find more information For more information about poisoning prevention, visit the American Association of Poison Control Centers: www.aapcc.org Get help right away if you have been exposed to poison and you:  Have trouble breathing.  Have chest pain.  Have trouble staying awake.  Have a seizure.  Pass out.  Feel dizzy.  Have severe vomiting or bleeding.  Have a headache that gets worse.  Are less alert than normal.  Have a widespread rash.  Notice a change in your vision.  Have trouble swallowing.  Have severe abdominal pain. These symptoms may represent a serious problem that is an emergency. Do not wait to see if the symptoms will go away. Get medical help right away. Call your local emergency services (911 in the [place] Do not drive yourself to the hospital. Summary  Poisoning happens when you eat, drink, touch, or breathe in a harmful substance.  Poisoning causes health problems that can range from mild to life-threatening.  Read labels before using medicines or household products.  Leave the original labels on the containers for medicines or household products.  If you are exposed  to poison, contact your local poison control center right away. Call [phone] (in the [place] to reach the poison control center for your area. This information is not intended to replace advice given to you by your health care provider. Make sure you discuss any questions you have with your health care provider. Document Revised: 12/06/2018 Document Reviewed: 06/13/2018 Elsevier Patient Education  2020 [employer].

## 2019-12-04 NOTE — Progress Notes (Signed)
SUBJECTIVE:   CHIEF COMPLAINT / HPI: falls  Falls: patient has a history of falls since documented in the chart since at least 2012. Every year or so she has a fall and goes to the ED. Most recently she had a fall about a month ago. She was sitting in a chair w/ a back and suddenly fell out of the chair. She did not lose consciousness nor hit her head. No abnormalities noted in exam at ED. Patient did not have prodrome or have dizziness. Patient has not had another episode. Her falls historically do not include prodrome, dizziness, strong emotions, nausea, vomiting, etc. The last time she had a fall it was due to missing a step while walking down stairs. Of note, patient is blind, she has untreated cataracts, and she has Downs syndrome.  Detergent Ingestion: patient's cousin and caregiver reports that yesterday patient ingested about 2 tablespoons of Fabuloso detergent cleaner. The bottle was on the table while patient's mother was cleaning. As soon as it was noticed, the fabuloso was removed. Patient had nausea and vomiting for several hours afterward, but by evening was able to tolerate fluids and even had some soup. Today she continues to tolerate both food and water.  PERTINENT  PMH / PSH: cataracts, DS, isolated macrocytosis  OBJECTIVE:   BP 100/60   Pulse 68   Temp 99.8 F (37.7 C) (Oral)   Ht 4\' 11"  (1.499 m)   Wt 112 lb (50.8 kg)   SpO2 97%   BMI 22.62 kg/m   Physical Exam Vitals and nursing note reviewed.  Constitutional:      General: She is not in acute distress.    Appearance: She is normal weight. She is not ill-appearing, toxic-appearing or diaphoretic.  HENT:     Head: Normocephalic and atraumatic.     Nose: Nose normal. No congestion.     Mouth/Throat:     Mouth: Mucous membranes are dry.     Pharynx: Oropharynx is clear. No oropharyngeal exudate or posterior oropharyngeal erythema.  Eyes:     Comments: Thick cataracts L>R  Cardiovascular:     Rate and Rhythm:  Normal rate and regular rhythm.     Pulses: Normal pulses.     Heart sounds: Normal heart sounds. No murmur. No friction rub. No gallop.   Pulmonary:     Effort: Pulmonary effort is normal.     Breath sounds: Normal breath sounds. No wheezing, rhonchi or rales.  Abdominal:     General: Abdomen is flat. Bowel sounds are normal. There is no distension.     Palpations: Abdomen is soft.     Tenderness: There is no abdominal tenderness.  Musculoskeletal:     Right lower leg: No edema.     Left lower leg: No edema.  Skin:    General: Skin is warm and dry.  Neurological:     Mental Status: She is alert. Mental status is at baseline.     Comments: Patient is at her baseline per caregiver/cousin. Difficult to understand speech pattern  Psychiatric:        Mood and Affect: Mood normal.        Behavior: Behavior normal.     ASSESSMENT/PLAN:  Yvonne Tyler is a 64 yo woman with Down's Syndrome, macrocytosis, detergent ingestion, cataracts, and a history of falls  Falls Patient with history of falls since at least 2012, multifactorial in nature due to blindness. Orthostatics negative and VSS. Due to patient's age, likelihood of  dementia, and vision difficulties she would likely be best served by going to geriatric clinic for a full assessment.  - Refer to geriatric clinic  Accidental ingestion of toxic substance Patient ingested small amount of detergent yesterday by accident. On exam she has no evidence of burns in her throat. Tolerating PO. Called poison control who agrees ok for patient to go home. She may have diarrhea in a few days, and that is expected. Poison control contact given to patient's caregiver and instructed them to lock up all cleaning fluid.  Macrocytosis without anemia Patient with macrocytosis of 109.5 at CBC 1 year ago. Patient has Down's syndrome, not uncommon to see macrocytosis in children. Will obtain CBC, CMP, folate, reticulocyte count, Vit B12, and TSH to rule out  other possible causes.  Cataracts, bilateral Recommended that patient's caregiver take her to see her ophthalmologist, Dr. Gershon Tyler, for evaluation.   Yvonne Damme, MD [place]

## 2019-12-05 ENCOUNTER — Telehealth: Payer: Self-pay | Admitting: Family Medicine

## 2019-12-05 DIAGNOSIS — E538 Deficiency of other specified B group vitamins: Secondary | ICD-10-CM

## 2019-12-05 MED ORDER — VITAMIN B 12 500 MCG PO TABS: 1000.0000 ug | ORAL_TABLET | Freq: Every day | ORAL | 3 refills | Status: DC

## 2019-12-05 NOTE — Telephone Encounter (Signed)
Spoke with Yvonne Tyler. Patient has vitamin B12 deficiency. cyanocobalamin prescribed to pharmacy on file.   Shirlean Mylar, MD [facility], PGY-1

## 2019-12-06 ENCOUNTER — Encounter: Payer: Self-pay | Admitting: Family Medicine

## 2019-12-06 DIAGNOSIS — Q120 Congenital cataract: Secondary | ICD-10-CM | POA: Insufficient documentation

## 2019-12-06 DIAGNOSIS — D7589 Other specified diseases of blood and blood-forming organs: Secondary | ICD-10-CM | POA: Insufficient documentation

## 2019-12-06 DIAGNOSIS — W19XXXA Unspecified fall, initial encounter: Secondary | ICD-10-CM | POA: Insufficient documentation

## 2019-12-06 DIAGNOSIS — T6591XA Toxic effect of unspecified substance, accidental (unintentional), initial encounter: Secondary | ICD-10-CM | POA: Insufficient documentation

## 2019-12-06 DIAGNOSIS — H269 Unspecified cataract: Secondary | ICD-10-CM | POA: Insufficient documentation

## 2019-12-06 NOTE — Assessment & Plan Note (Signed)
Patient ingested small amount of detergent yesterday by accident. On exam she has no evidence of burns in her throat. Tolerating PO. Called poison control who agrees ok for patient to go home. She may have diarrhea in a few days, and that is expected. Poison control contact given to patient's caregiver and instructed them to lock up all cleaning fluid.

## 2019-12-06 NOTE — Assessment & Plan Note (Signed)
Recommended that patient's caregiver take her to see her ophthalmologist, Dr. Nile Riggs, for evaluation.

## 2019-12-06 NOTE — Assessment & Plan Note (Signed)
Patient with history of falls since at least 2012, multifactorial in nature due to blindness. Orthostatics negative and VSS. Due to patient's age, likelihood of dementia, and vision difficulties she would likely be best served by going to geriatric clinic for a full assessment.  - Refer to geriatric clinic

## 2019-12-06 NOTE — Assessment & Plan Note (Signed)
Patient with macrocytosis of 109.5 at CBC 1 year ago. Patient has Down's syndrome, not uncommon to see macrocytosis in children. Will obtain CBC, CMP, folate, reticulocyte count, Vit B12, and TSH to rule out other possible causes.

## 2019-12-13 LAB — COMPREHENSIVE METABOLIC PANEL
ALT: 9 IU/L (ref 0–32)
AST: 20 IU/L (ref 0–40)
Albumin/Globulin Ratio: 1.3 (ref 1.2–2.2)
Albumin: 3.9 g/dL (ref 3.8–4.8)
Alkaline Phosphatase: 64 IU/L (ref 39–117)
BUN/Creatinine Ratio: 11 — ABNORMAL LOW (ref 12–28)
BUN: 11 mg/dL (ref 8–27)
Bilirubin Total: 0.9 mg/dL (ref 0.0–1.2)
CO2: 24 mmol/L (ref 20–29)
Calcium: 9.1 mg/dL (ref 8.7–10.3)
Chloride: 105 mmol/L (ref 96–106)
Creatinine, Ser: 0.98 mg/dL (ref 0.57–1.00)
GFR calc Af Amer: 71 mL/min/{1.73_m2} (ref >59)
GFR calc non Af Amer: 62 mL/min/{1.73_m2} (ref >59)
Globulin, Total: 3.1 g/dL (ref 1.5–4.5)
Glucose: 92 mg/dL (ref 65–99)
Potassium: 3.9 mmol/L (ref 3.5–5.2)
Sodium: 142 mmol/L (ref 134–144)
Total Protein: 7 g/dL (ref 6.0–8.5)

## 2019-12-13 LAB — CBC WITH DIFFERENTIAL/PLATELET
Basophils Absolute: 0.1 10*3/uL (ref 0.0–0.2)
Basos: 1 %
EOS (ABSOLUTE): 0 10*3/uL (ref 0.0–0.4)
Eos: 0 %
Hematocrit: 38.4 % (ref 34.0–46.6)
Hemoglobin: 13.4 g/dL (ref 11.1–15.9)
Immature Grans (Abs): 0 10*3/uL (ref 0.0–0.1)
Immature Granulocytes: 0 %
Lymphocytes Absolute: 1.7 10*3/uL (ref 0.7–3.1)
Lymphs: 27 %
MCH: 37.1 pg — ABNORMAL HIGH (ref 26.6–33.0)
MCHC: 34.9 g/dL (ref 31.5–35.7)
MCV: 106 fL — ABNORMAL HIGH (ref 79–97)
Monocytes Absolute: 0.5 10*3/uL (ref 0.1–0.9)
Monocytes: 8 %
Neutrophils Absolute: 4 10*3/uL (ref 1.4–7.0)
Neutrophils: 64 %
Platelets: 211 10*3/uL (ref 150–450)
RBC: 3.61 x10E6/uL — ABNORMAL LOW (ref 3.77–5.28)
RDW: 11.8 % (ref 11.7–15.4)
WBC: 6.3 10*3/uL (ref 3.4–10.8)

## 2019-12-13 LAB — METHYLMALONIC ACID, SERUM: Methylmalonic Acid: 227 nmol/L (ref 0–378)

## 2019-12-13 LAB — TSH: TSH: 0.593 u[IU]/mL (ref 0.450–4.500)

## 2019-12-13 LAB — RETICULOCYTES: Retic Ct Pct: 1.2 % (ref 0.6–2.6)

## 2019-12-13 LAB — FOLATE: Folate: >20 ng/mL (ref >3.0)

## 2019-12-13 LAB — VITAMIN B12: Vitamin B-12: 190 pg/mL — ABNORMAL LOW (ref 232–1245)

## 2019-12-22 ENCOUNTER — Telehealth: Payer: Self-pay | Admitting: *Deleted

## 2019-12-22 DIAGNOSIS — Z0189 Encounter for other specified special examinations: Secondary | ICD-10-CM

## 2019-12-22 NOTE — Telephone Encounter (Signed)
-----   Message from Henri Medal, [place] sent at 12/04/2019  1:04 PM EDT ----- Regarding: GERI clinic Hi all! I saw this patient of Dr. Carollee Massed today in the clinic. She has a history of falls since 2012, they occur about every 2 years or so, does not appear to be syncope as she does not faint, no prodrome.  Orthostatics today were negative. Most recently, she fell backward out of a chair. SHe has also fallen down stairs from missing steps before. 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 I think she would be a good candidate for this interdisciplinary clinic. Please let me know what else you need to schedule her.

## 2019-12-22 NOTE — Telephone Encounter (Signed)
Spoke with Yvonne Tyler (caregiver) and let her know that Dr. Leary Roca wanted patient to be seen by Dr. McDiarmid on the geri clinic to assess her for falls. Caregiver and patient are agreeable and appt made for 5- 20-21 at 1:45pm.  Patient has an upcoming appointment with PCP on 12-31-19 and will give her the geri paperwork then and ask CCM to contact her after this.  Jazmin Hartsell,CMA

## 2019-12-31 ENCOUNTER — Other Ambulatory Visit: Payer: Self-pay

## 2019-12-31 ENCOUNTER — Encounter: Payer: Self-pay | Admitting: Family Medicine

## 2019-12-31 ENCOUNTER — Ambulatory Visit (INDEPENDENT_AMBULATORY_CARE_PROVIDER_SITE_OTHER): Payer: Medicaid Other | Admitting: Family Medicine

## 2019-12-31 VITALS — BP 96/60 | HR 56 | Ht 59.0 in | Wt 112.4 lb

## 2019-12-31 DIAGNOSIS — Z1211 Encounter for screening for malignant neoplasm of colon: Secondary | ICD-10-CM | POA: Diagnosis not present

## 2019-12-31 DIAGNOSIS — H269 Unspecified cataract: Secondary | ICD-10-CM

## 2019-12-31 DIAGNOSIS — Z0000 Encounter for general adult medical examination without abnormal findings: Secondary | ICD-10-CM | POA: Diagnosis not present

## 2019-12-31 DIAGNOSIS — Q909 Down syndrome, unspecified: Secondary | ICD-10-CM

## 2019-12-31 NOTE — Patient Instructions (Signed)
We are referring you to gastroenterology for colonoscopy screening.   We are referring you to ophthalmology for eye exam.   We are referring you to audiology for a screening test.

## 2019-12-31 NOTE — Progress Notes (Signed)
    SUBJECTIVE:   CHIEF COMPLAINT / HPI:   Patient reports for annual checkup.  Patient is with his caregiver.  Patient seems to be complaining of her throat.  Evaluation was assessed using the National Down syndrome Society (NDSS) adult primary care guidelines.   TSH performed in 11/2019 was within normal limits. BMP performed in 11/2019 was within normal limits.  Patient has never had an audiology evaluation. \ Patient is blind at baseline, but appears to have cataracts/keratosis.  She has never followed up with ophthalmology for.  Health maintenance Patient is up-to-date on mammography. Patient has never been sexually active and declines to have Pap smear.  Patient is out of date for colonoscopy. Patient is scheduled to get COVID-19 vaccine in the next few weeks.  PERTINENT  PMH / PSH:  Down syndrome  OBJECTIVE:   BP 96/60 Comment: provider informed  Pulse (!) 56 Comment: provider informed  Ht 4\' 11"  (1.499 m)   Wt 112 lb 6 oz (51 kg)   SpO2 95%   BMI 22.70 kg/m   Gen: NAD, resting comfortably HEENT: MMM, normocephalic/atraumatic, cataracts, no oropharyngeal lesions, no cervical lymphadenopathy CV: RRR with no murmurs appreciated Pulm: NWOB, CTAB with no crackles, wheezes, or rhonchi , Nontender, Nondistended. MSK: no edema, cyanosis, or clubbing noted Skin: warm, dry Neuro: grossly normal, moves all extremities Psych: Normal affect and thought content   ASSESSMENT/PLAN:   Problem List Items Addressed This Visit      Other   Down syndrome - Primary    -Audiology referral -GI referral -Ophthalmology referral      Relevant Orders   Ambulatory referral to Ophthalmology   Ambulatory referral to Audiology   Cataracts, bilateral   Screen for colon cancer   Relevant Orders   Ambulatory referral to Gastroenterology         YI:RSWN, MD [facility]

## 2019-12-31 NOTE — Assessment & Plan Note (Signed)
-  Audiology referral -GI referral -Ophthalmology referral

## 2020-01-01 ENCOUNTER — Encounter: Payer: Self-pay | Admitting: Gastroenterology

## 2020-01-08 ENCOUNTER — Ambulatory Visit: Payer: Medicaid Other | Admitting: Licensed Clinical Social Worker

## 2020-01-08 ENCOUNTER — Encounter: Payer: Self-pay | Admitting: Licensed Clinical Social Worker

## 2020-01-08 ENCOUNTER — Other Ambulatory Visit: Payer: Self-pay

## 2020-01-08 ENCOUNTER — Ambulatory Visit: Payer: Self-pay | Admitting: Licensed Clinical Social Worker

## 2020-01-08 DIAGNOSIS — Z139 Encounter for screening, unspecified: Secondary | ICD-10-CM

## 2020-01-08 NOTE — Chronic Care Management (AMB) (Signed)
    Clinical Social Work  Care Management Outreach   01/08/2020 Name: Yvonne Tyler MRN: [id number] DOB: 1956/05/17  Yvonne Tyler is a 64 y.o. year old female who is a primary care patient of Garnette Gunner, MD .  The Care Management team was consulted for assistance with psychosocial assessment for Yvonne Tyler.  LCSW reached out to Yvonne Tyler's caretaker Alilene today by phone to introduce self, assess needs. Caretaker is in the Tyler and asked LCSW to call her daughter Yvonne Tyler.   Review of patient status, including review of consultants reports, relevant laboratory and other test results, and collaboration with appropriate care team members and the patient's provider was performed as part of comprehensive patient evaluation and provision of care management services.    Follow Up Plan: Phone appointment scheduled 01/08/20  Sammuel Hines, LCSW Chronic Care Coordination  [facility] / [facility]   [phone] 9:20 AM

## 2020-01-08 NOTE — Chronic Care Management (AMB) (Signed)
  Care Management Clinical Social Work  [facility] Psychosocial     01/08/2020 Name: Yvonne Tyler MRN: [id number] DOB: 15-Jun-1956  Yvonne Tyler is a 64 y.o. year old female who sees Garnette Gunner, MD for primary care.  Referred by Dr. McDiarmid for assessing psychosocial needs, support and barriers. (Look under patient's history for social information from assessment).    SDOH (Social Determinants of Health) assessments performed: Yes: No needs identified  Advance Directive Status: None on file. Discussed guardianship options with family  Review of patient status, including review of consultants reports, relevant laboratory and other test results, and collaboration with appropriate care team members and the patient's provider was performed as part of comprehensive patient evaluation and provision of chronic care management services.   Outpatient Encounter Medications as of 01/08/2020  Medication Sig  . acetaminophen (TYLENOL) 325 MG tablet Take 2 tablets (650 mg total) by mouth every 6 (six) hours as needed. (Patient not taking: Reported on 11/08/2019)  . Cyanocobalamin (VITAMIN B 12) 500 MCG TABS Take 1,000 mcg by mouth daily.   No facility-administered encounter medications on file as of 01/08/2020.   Patient;s family agreed to services provided today and verbal consent obtained.  After talking with family no needs identified.  LCSW will obtain information to share about guardianship process for patient.    Plan: LCSW will meet with family during Geri clinic for ongoing needs  Sammuel Hines, LCSW Chronic Care Coordination  [facility] / [facility]   [phone] 12:10 PM

## 2020-01-15 ENCOUNTER — Ambulatory Visit: Payer: Self-pay | Admitting: Licensed Clinical Social Worker

## 2020-01-15 ENCOUNTER — Other Ambulatory Visit: Payer: Self-pay

## 2020-01-15 ENCOUNTER — Ambulatory Visit (INDEPENDENT_AMBULATORY_CARE_PROVIDER_SITE_OTHER): Payer: Medicaid Other | Admitting: Family Medicine

## 2020-01-15 VITALS — BP 110/60 | HR 67 | Wt 111.0 lb

## 2020-01-15 DIAGNOSIS — L602 Onychogryphosis: Secondary | ICD-10-CM

## 2020-01-15 DIAGNOSIS — R296 Repeated falls: Secondary | ICD-10-CM | POA: Diagnosis not present

## 2020-01-15 DIAGNOSIS — Q909 Down syndrome, unspecified: Secondary | ICD-10-CM

## 2020-01-15 DIAGNOSIS — M545 Low back pain, unspecified: Secondary | ICD-10-CM

## 2020-01-15 DIAGNOSIS — Z23 Encounter for immunization: Secondary | ICD-10-CM | POA: Diagnosis not present

## 2020-01-15 DIAGNOSIS — E538 Deficiency of other specified B group vitamins: Secondary | ICD-10-CM | POA: Diagnosis not present

## 2020-01-15 DIAGNOSIS — Z7189 Other specified counseling: Secondary | ICD-10-CM

## 2020-01-15 DIAGNOSIS — D7589 Other specified diseases of blood and blood-forming organs: Secondary | ICD-10-CM | POA: Diagnosis not present

## 2020-01-15 DIAGNOSIS — G4724 Circadian rhythm sleep disorder, free running type: Secondary | ICD-10-CM

## 2020-01-15 DIAGNOSIS — H548 Legal blindness, as defined in USA: Secondary | ICD-10-CM

## 2020-01-15 DIAGNOSIS — M4046 Postural lordosis, lumbar region: Secondary | ICD-10-CM

## 2020-01-15 HISTORY — DX: Circadian rhythm sleep disorder, free running type: G47.24

## 2020-01-15 MED ORDER — SHINGRIX 50 MCG/0.5ML IM SUSR: 0.5000 mL | Freq: Once | INTRAMUSCULAR | 1 refills | Status: AC

## 2020-01-15 NOTE — Chronic Care Management (AMB) (Signed)
  Care Management   Clinical Social Work Follow Up   01/15/2020 Name: JAEMARIE HOCHBERG MRN: [id number] DOB: September 10, 1955  Referred by: Bonnita Hollow, MD  Reason for referral : Care Coordination (F/U)  Yvonne Tyler is a 64 y.o. year old female who is a primary care patient of Bonnita Hollow, MD.  Reason for follow-up: Collaborative office encounter with patient today during [place] clinic. Met with patient and cousin [place]. Provided and reviewed guardianship packet with [place]. No other LCSW needs identified   Review of patient status, including review of consultants reports, relevant laboratory and other test results, and collaboration with appropriate care team members and the patient's provider was performed as part of comprehensive patient evaluation and provision of care management services.    Advance Directive Status: N  SDOH (Social Determinants of Health) assessments performed: No needs    Outpatient Encounter Medications as of 01/15/2020  Medication Sig  . acetaminophen (TYLENOL) 325 MG tablet Take 2 tablets (650 mg total) by mouth every 6 (six) hours as needed.  . Cyanocobalamin (VITAMIN B 12) 500 MCG TABS Take 1,000 mcg by mouth daily.  Marland Kitchen Zoster Vaccine Adjuvanted [facility]) injection Inject 0.5 mLs into the muscle once for 1 dose.   No facility-administered encounter medications on file as of 01/15/2020.   Plan: Patient's cousin will start guardianship process.  She will call DSS if she has questions about the process. No F/U scheduled with LCSW  Casimer Lanius, [place] / [place]   [phone] 4:09 PM

## 2020-01-15 NOTE — Progress Notes (Signed)
[facility] Pharmacy Note   Completed a medication review and reviewed allergies with patient's care giver.   Discussed importance of COVID-19 and Shingrix vaccinationations with patient's caregiver.   Recommendations: - Prescription sent to [facility] on [address] for [employer].  - Recommended to obtain COVID-19 vaccination as well and discussed options to obtain at [facility] as well as through the [facility].  - Discussed with Dr. 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   Thank you for allowing pharmacy to be involved in this patient's care.   , PharmD PGY1 Pharmacy Resident

## 2020-01-15 NOTE — Progress Notes (Addendum)
CPT E&M Office Visit Time Before Visit; reviewing medical records (e.g. recent visits, labs, studies): 15 minutes During Visit (F2F time): 35 minutes After Visit (discussion with family or HCP, prescribing, ordering, referring, calling result/recommendations or documenting on same day): 15 minutes Total Visit Time: 65 minutes  Level 2 Est: 10-19 min     New: 15-29 min Level 3 Est: 20-29 min     New: 30-44 min Level 4 Est: 30-39 min     New: 45-59 min Level 5 Est: 40-54 min     New: 60-74 min  > 60 minutes face to face where spent in total with counseling / coordination of care took. The problem list and recommendations for the patient was discussed among the three disciplines prior to meeting with the patient and their family caretaker.  Each discipline meet with the patient and family/friend/caretaker to assess the issue particular to their disciplines.   Yvonne Tyler:   Patient is accompanied by: Yvonne Tyler (cousin and caretaker) Primary caregiver: Yvonne Tyler (Aunt), and Yvonne Tyler Patient's lives with their family. Patient information was obtained from patient, relative(s) and Dr Yvonne Tyler spoke by phone with patient's aunt, Yvonne Tyler, about her observations of patient's change in affect, behavior and cognition.  History/Exam limitations: mental status and communication barrier Language and Blind. Primary Care Provider: Garnette Gunner, MD Referring provider: Shirlean Mylar, MD Reason for referral:  Chief Complaint  Patient presents with  . Fall   Previous Report Reviewed: ER records, historical medical records, lab reports, office notes and radiology reports    Patient's Care Team No care team member to display   ----------------------------------------------------------------------------------------------------------------------------------------------------------------------------------------------------------------------------------------------------------------   HPI by problems:  Chief Complaint  Patient presents with  . Fall   FALL History of falls since 2012.  Last fall was in 03/21 when she fell backward out of a chair at home. Her evaluation at ED had unremarkable head and cervical CT.    She also had a fall down home stairs in November 2021 with resulting periorbital hematoma.   Change in position prior to fall: no Dizziness prior to fall:  no Syncope prior to fall:  Not recently, but Yvonne Tyler was seen in ED March 2020 for near-syncope thought to be due to dehydration.   Something that caused the fall (steps, curbs, uneven surface, slippery surface, shoes, etc.): blindness Loss of balance:   yes  What Body parts struck object(s) or ground: head  Worry about falling:  Caretakers observe patient being very cautious when walking  Problems doing ADLs: no, independent in all ADLs    Hx of orthostatic dizziness: see above  History of heart arrhythmias: no   Hx of problems with legs (strength, pain):  no  Hx of problems with feet: onycogryphosis  Hx of arthritis:  none reported   Hx of incontinence:  yes  Hx of problems with balance: yes  Hx of problems with walking: yes    Hx problems with getting out bed or chair:  no  Hx of Parkinsons Disease: no  Hx of Strokes: no Hx of Cardiac Disease:  no Hx of Cogntive Impairment: yes  Hx of alcohol use disorder: no     Uses a  assist device, e.g., cane, walker, wheelchair:  no Holds onto furniture when walking inside home: yes Pushes up with her hands when standing up from chair: yes    Cognitive impairment concern  Question of capacity to make informed decisions - Yvonne Tyler's has Down Syndrome - Yvonne Tyler's mother died  when the patient was a teenager.  - Caretakers, Yvonne Tyler (Aunt of patient) &  Yvonne Tyler (1st Yvonne Tyler, daughter of Yvonne Tyler) have been primary caretakers for Yvonne Tyler's ever since the patient moved to [place], approximately 16 years ago.  - She has a limited vocabulary.  She repeats same phrases frequently.  She will talk with others.  She can follow directions. She does have an imaginary friend with whom she speaks with frequently. - She cannot read or write.  - She is able to perform the basic activities of daily living in a supportive home environment.  She is not able to perform independent activities of daily living.   - Our two caretaker informants denied the new appearance or progressive decline     Behavioral and Psychological Symptoms of Dementia (relevant or irrelevant): none If relevant, address whether these symptoms are present:   Outpatient Encounter Medications as of 01/15/2020  Medication Sig  . acetaminophen (TYLENOL) 325 MG tablet Take 2 tablets (650 mg total) by mouth every 6 (six) hours as needed.  . Cyanocobalamin (VITAMIN B 12) 500 MCG TABS Take 1,000 mcg by mouth daily.  . [EXPIRED] Zoster Vaccine Adjuvanted [facility]) injection Inject 0.5 mLs into the muscle once for 1 dose.   No facility-administered encounter medications on file as of 01/15/2020.    History Patient Active Problem List   Diagnosis Date Noted  . Accidental ingestion of toxic substance 12/06/2019    Priority: High  . 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 Down syndrome 08/03/2006    Priority: High  . 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 Falls 12/06/2019    Priority: Low  . Macrocytosis without anemia 12/06/2019    Priority: Low  . 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 Recurrent falls 01/16/2020  . Lumbar back pain 01/16/2020  . Exaggerated lumbar lordosis 01/16/2020   . Vitamin B12 deficiency 01/16/2020  . Heart murmur 05/09/2012   Past Medical History:  Diagnosis Date  . Blindness   . Dizziness 05/06/2019  . Down syndrome   . Exaggerated lumbar lordosis 01/16/2020  . Hyperlipidemia   . 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 Recurrent falls 01/16/2020  . Suspected COVID-19 virus infection 05/06/2019  . Syncope Oct 2012   EKG showed Bradycardia in 50's otherwise normal.  . Vitamin B12 deficiency 01/16/2020   History reviewed. No pertinent surgical history. History reviewed. No pertinent family history. Social History   Socioeconomic History  . Marital status: Single    Spouse name: Not on file  . Number of children: Not on file  . Years of education: Not on file  . Highest education level: Not on file  Occupational History  . Not on file  Tobacco Use  . Smoking status: Never Smoker  . Smokeless tobacco: Never Used  Substance and Sexual Activity  . Alcohol use: Yes    Comment: Beer once a year on her birthday.  . Drug use: No  . Sexual activity: Never  Other Topics Concern  . Not on file  Social History Narrative       Patient is legally blind and has diagnosis of down Syndrome.  lives with aunt Vilinda Blanks and cousin since mother passed away as a teenager. Patient very independent of ADL's.     Family moved to [facility] from [place] about 15 years ago.  Patient not  participated in any day programs since moving to [facility].  She participated in day programs in [place] but no longer wants to go.    Patient has a doll that she enjoys, she also enjoys talking and Riding in the car. Primary family support persons is cousin Yvonne Tyler. Transportation to appointments provided by cousin Yvonne Tyler, who is her primary caretaker.   Patient does not have a legal guardian.         Social Determinants of Health   Financial Resource Strain:   . Difficulty of Paying Living Expenses:   Food Insecurity: No Food Insecurity  . Worried About Programme researcher, broadcasting/film/video in the Last  Year: Never true  . Ran Out of Food in the Last Year: Never true  Transportation Needs: No Transportation Needs  . Lack of Transportation (Medical): No  . Lack of Transportation (Non-Medical): No  Physical Activity:   . Days of Exercise per Week:   . Minutes of Exercise per Session:   Stress:   . Feeling of Stress :   Social Connections:   . Frequency of Communication with Friends and Family:   . Frequency of Social Gatherings with Friends and Family:   . Attends Religious Services:   . Active Member of Clubs or Organizations:   . Attends Banker Meetings:   Marland Kitchen Marital Status:       Cardiovascular Risk Factors: none  Educational History: Zero years formal education  Basic Activities of Daily Living  Dressing: Self-care Eating: Self-care Ambulation: Self-care Toileting: Self-care Bathing: Self-care  Instrumental Activities of Daily Living Shopping: Total assistance House/Yard Work: Total assistance Administration of medications: Total assistance Finances: Total assistance Telephone: Total assistance Transportation: Total assistance   Safety: Accidental ingestion of laundry detergent last month with acute UGI illness without longterm complications   Formal Home Health Assistance  Physical Therapy: no  Occupational Therapy: no             Home Aid / Personal Care Service: no             Homemaker services: no  FALLS in last five office visits:  Fall Risk  01/15/2020 12/31/2019 12/04/2019 05/06/2019 03/16/2017  Falls in the past year? 1 0 1 0 No  Number falls in past yr: 1 0 1 - -  Injury with Fall? 1 0 1 - -  Risk for fall due to : History of fall(s);Impaired balance/gait;Impaired mobility - - - -  Follow up - - Falls evaluation completed;Follow up appointment - -  Comment - - MD informed - -    Health Maintenance reviewed: Immunization History  Administered Date(s) Administered  . Influenza Split 06/19/2011, 05/09/2012  . Influenza Whole 05/30/2004   . Influenza,inj,Quad PF,6+ Mos 07/08/2013, 05/06/2019  . Tdap 05/09/2012   Health Maintenance Topics with due status: Overdue     Topic Date Due   COLONOSCOPY 01/18/2018    Diet: Regular Nutritional supplements: none    Vital Signs Weight: 111 lb (50.3 kg) Body mass index is 22.42 kg/m. CrCl cannot be calculated (Patient's most recent lab result is older than the maximum 21 days allowed.). Body surface area is 1.45 meters squared. Vitals:   01/15/20 1349  BP: 110/60  Pulse: 67  SpO2: 96%  Weight: 111 lb (50.3 kg)   Wt Readings from Last 3 Encounters:  01/15/20 111 lb (50.3 kg)  12/31/19 112 lb 6 oz (51 kg)  12/04/19 112 lb (50.8 kg)   Dementia Screening Questionnaires for Individuals with Intellectual  Disabilities (DSQIID) score 2 / 53.             - Score less than 20 does not support possible dementia of Alzheimer's Type  Physical Examination:  VS reviewed GEN: Alert, Cooperative, Groomed, NAD HEENT: PERRL; EAC bilaterally not occluded, TM's translucent with normal LM, (+) LR;    COR: RRR, No M LUNGS: BCTA, No Acc mm use, speaking in full sentences  Neuro: Muscle Tone normal; Tremor not present;  Back: hyperlordotic lumbar back with taunt lumbar paraspinal muscles and flat thoracic back Gait: petite ped, very slow, shuffling gait with no swing phase leg lift, arms held stiff, bilaterally with slight abducted and flexed Psych: Showed facial expressions, hugged examiner at end of visit Timed Up & Go Test: > 30 seconds but patient is blind Sit-to-Stand Test: 10 / 30-seconds 4-Stage Stand Test:  Feet Side-by-side: pass     Feet Semi-tandem: fail        Labs No components found for: VITAMIND  Lab Results  Component Value Date   VITAMINB12 190 (L) 12/04/2019    Lab Results  Component Value Date   FOLATE >20.0 12/04/2019    Lab Results  Component Value Date   TSH 0.593 12/04/2019    No results found for: RPR  Lab Results  Component Value Date    HIV Non Reactive 03/16/2017      Chemistry      Component Value Date/Time   NA 142 12/04/2019 1357   K 3.9 12/04/2019 1357   CL 105 12/04/2019 1357   CO2 24 12/04/2019 1357   BUN 11 12/04/2019 1357   CREATININE 0.98 12/04/2019 1357   CREATININE 1.05 05/09/2012 1059      Component Value Date/Time   CALCIUM 9.1 12/04/2019 1357   ALKPHOS 64 12/04/2019 1357   AST 20 12/04/2019 1357   ALT 9 12/04/2019 1357   BILITOT 0.9 12/04/2019 1357      Lab Results  Component Value Date   WBC 6.3 12/04/2019   HGB 13.4 12/04/2019   HCT 38.4 12/04/2019   MCV 106 (H) 12/04/2019   PLT 211 12/04/2019    No results found for this or any previous visit (from the past 24 hour(s)).  Imaging Head CT: 11/08/19 CC: head trauma after fall. Findings: no acute abnormalities.  No significant other abnormalities.   Personal Strengths Supportive family/friends Other: perceived positive mood  Support System Strengths Supportive Relationships and Family   Advanced Directives Await decisions about guardianship before addressing advanced directives.    ------------------------------------------------------------------------------------------------------------------------------------------------------------------------------------------------------------------------------------------------------------------------------------------------------------------------------------------------------------------------------------------------------------------------------------------------------------------------------------------------------------  Assessment and Plan: Please see individual consultation notes from physical therapy, pharmacy and social work for today.    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 Moderate range of intellectual disability as  evidenced by  Yvonne Tyler requiring daily ongoing support for complex, independent activities of daily living.  Yet, she is independent in her basic activities of daily living.    Her supportive home environment, such as she has with her aunt and cousin, allows Yvonne Tyler to live in the community.   Yvonne Tyler has limited expressive language skills.  A/  Given that two primary caretaker who have taken care of Yvonne Tyler for over 15 years denied new or progressive symptoms of decline in Yvonne Mcconaha's cognitive and physical capacity, the diagnosis of dementia,  very common in patient's with Down Syndrome by age 64, is not supported.    I have concern that Yvonne Tyler does have impaired abilities to understand complex information, retain that information, use or weigh the information as part of decision making, and then clearly communicate her decision.   P/ I believe that it would be appropriate for Yvonne Tyler's caretakers, Yvonne Tyler and Myrtice LauthVeronica Whitsett, to request of courts to award guardianship for the best interests of Yvonne Tyler.  Guardianship would allow informed decisions, in the patient's best interest, about her person and property.           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  It can take up to 3 month to entrain the circadian cycle to the desired sleep-wake cycle.          Low   Macrocytosis without anemia    Patient is taking the recommended Vit B12 supplementation Recommend check CBC indices in next few months      Onychogryphosis    Established problem Consider referral to Podiatry for care of foot deformities and thickened nails.         Unprioritized   Exaggerated lumbar lordosis   Relevant Orders   Ambulatory referral to Physical Therapy   Lumbar back pain    New  complaint Patient with significant postural debility. Exaggerated lumbar lordosis with a flattened thoracic back.    APAP for pain Referral to PT for recommendations.  May benefit from imaging to access degree of spondylolisthesis, spondylolysis and possible bridging osteophytes.       Recurrent falls    Established problem Uncontrolled. Falls have multifactorial origins, including impaired balance, slow gait, legal blindness without assist device, fear of falling, forward center of gravity posture, rigid arm during gait phases,  recent evidence of low BP treated as hypovolemic by ED, and onychogryphosis  Referral to [facility] to help patient with orientation & mobility, balance and postural exercises.   In my opinion, Yvonne Melvenia BeamSimon may have a long enough life expectancy given her general good health, depsite the reduced life span of patients with Down Sndrome, to benefit for bone health.  Recommend starting 800 to 1000 IU Vitamin D3 daily along with calcium supplementation. If her health continues to be good at age 64, a screening DEXA with idea of treating with bisphosphonates should there be indications.       Relevant Orders   Ambulatory referral to Physical Therapy   Vitamin B12 deficiency    Other Visit Diagnoses    Need for varicella vaccine         Down syndrome Established problem. Moderate range of intellectual disability as evidenced by  Yvonne Melvenia BeamSimon requiring daily ongoing support for complex, independent activities of daily living.  Yet, she is independent in her basic activities of daily living.    Her supportive home environment, such as she has with her aunt and cousin, allows Yvonne Melvenia BeamSimon to live in the community.   Yvonne Melvenia BeamSimon has limited expressive language skills.  A/  Given that two primary caretaker who have taken care of Yvonne Melvenia BeamSimon for over 15 years denied new or progressive symptoms of decline in Yvonne Swantek's cognitive and physical capacity, the diagnosis of  dementia, very common in patient's with Down Syndrome by  age 43, is not supported.    I have concern that Yvonne Kerchner does have impaired abilities to understand complex information, retain that information, use or weigh the information as part of decision making, and then clearly communicate her decision.   P/ I believe that it would be appropriate for Yvonne Gillin's caretakers, Yvonne Jerilee Hoh and [name], to request of courts to award guardianship for the best interests of Yvonne Saephanh.  Guardianship would allow informed decisions, in the patient's best interest, about her person and property.     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  It can take up to 3 month to entrain the circadian cycle to the desired sleep-wake cycle.    Lumbar back pain New complaint Patient with significant postural debility. Exaggerated lumbar lordosis with a flattened thoracic back.    APAP for pain Referral to PT for recommendations.  May benefit from imaging to access degree of spondylolisthesis, spondylolysis and possible bridging osteophytes.   Recurrent falls Established problem Uncontrolled. Falls have multifactorial origins, including impaired balance, slow gait, legal blindness without assist device, fear of falling, forward center of gravity posture, rigid arm during gait phases,  recent evidence of low BP treated as hypovolemic by ED, and onychogryphosis  Referral to [facility] re[facility] to help patient with orientation & mobility, balance and postural exercises.   In my opinion, Yvonne Deland may have a long enough life expectancy given her general good health, depsite the reduced life span of patients with Down Sndrome, to benefit for bone health.  Recommend starting 800 to 1000 IU Vitamin D3 daily along with  calcium supplementation. If her health continues to be good at age 12, a screening DEXA with idea of treating with bisphosphonates should there be indications.   Macrocytosis without anemia Patient is taking the recommended Vit B12 supplementation Recommend check CBC indices in next few months  Onychogryphosis Established problem Consider referral to Podiatry for care of foot deformities and thickened nails.

## 2020-01-16 ENCOUNTER — Encounter: Payer: Self-pay | Admitting: Family Medicine

## 2020-01-16 DIAGNOSIS — M545 Low back pain, unspecified: Secondary | ICD-10-CM | POA: Insufficient documentation

## 2020-01-16 DIAGNOSIS — R296 Repeated falls: Secondary | ICD-10-CM | POA: Insufficient documentation

## 2020-01-16 DIAGNOSIS — E538 Deficiency of other specified B group vitamins: Secondary | ICD-10-CM | POA: Insufficient documentation

## 2020-01-16 DIAGNOSIS — M4046 Postural lordosis, lumbar region: Secondary | ICD-10-CM

## 2020-01-16 HISTORY — DX: Postural lordosis, lumbar region: M40.46

## 2020-01-16 HISTORY — DX: Deficiency of other specified B group vitamins: E53.8

## 2020-01-16 HISTORY — DX: Repeated falls: R29.6

## 2020-01-16 NOTE — Assessment & Plan Note (Signed)
Patient is taking the recommended Vit B12 supplementation Recommend check CBC indices in next few months

## 2020-01-16 NOTE — Patient Instructions (Addendum)
Thank you for coming in for evaluation at the [facility].   Dr McDiarmid does believe that Ms Fons's aunt, Jerilee Hoh, and cousin, [name], application of guardianship of Ms Boord's would be in Ms Stearns's best interest.   Dr McDiarmid's office visit note from Ms Cary's 01/15/20 [facility] visit states his support for this legal action.   Ms Duffus would benefit from Physical Therapy at the [facility] where she can work on orientation, mobility, balance, and her back pain.   We recommend that Ms Porcher take a vitamin D supplement, 800 to 1000 IU, every day to help with her bones resist fracturing with falls.  She should take calcium 500 to 1000 mg daily.  This will give Vitamin D the "bricks" it needs to build bones.   You can purchase these supplements over-the-counter.  They can be purchased separately, or combined into one pill.   Because people with blindness often do not release their own natural melatonin into their brain like sighted people, they can get their days and nights mixed up.  Ms Bookbinder may want to consider starting on melatonin, a half milligram, every night about an hour before the desired bedtime.  It takes 2 to 3 months of the nightly melatonin treatment for persons who are blind to train their brain into a sleeping and waking cycle like sighted people.    So, if you try the melatonin, it takes a while before you might see benefits. Melatonin can be purchased over-the-counter.  Often there are no half (0.5 mg) milligram pills at the pharmacy, so take a half of a one milligram pill each night.

## 2020-01-16 NOTE — Assessment & Plan Note (Signed)
Established problem Uncontrolled. Falls have multifactorial origins, including impaired balance, slow gait, legal blindness without assist device, fear of falling, forward center of gravity posture, rigid arm during gait phases,  recent evidence of low BP treated as hypovolemic by ED, and onychogryphosis  Referral to [facility] rehab to help patient with orientation & mobility, balance and postural exercises.   In my opinion, Yvonne Tyler may have a long enough life expectancy given her general good health, depsite the reduced life span of patients with Down Sndrome, to benefit for bone health.  Recommend starting 800 to 1000 IU Vitamin D3 daily along with calcium supplementation. If her health continues to be good at age 28, a screening DEXA with idea of treating with bisphosphonates should there be indications.

## 2020-01-16 NOTE — Addendum Note (Signed)
Addended by: Acquanetta Belling D on: 01/16/2020 04:13 PM   Modules accepted: Orders

## 2020-01-16 NOTE — Assessment & Plan Note (Signed)
Established problem Consider referral to Podiatry for care of foot deformities and thickened nails.

## 2020-01-16 NOTE — Assessment & Plan Note (Addendum)
Established problem. Moderate range of intellectual disability as evidenced by  Yvonne Tyler requiring daily ongoing support for complex, independent activities of daily living.  Yet, she is independent in her basic activities of daily living.    Her supportive home environment, such as she has with her aunt and cousin, allows Yvonne Tyler to live in the community.   Yvonne Tyler has limited expressive language skills.  A/  Given that two primary caretaker who have taken care of Yvonne Tyler for over 15 years denied new or progressive symptoms of decline in Yvonne Tyler's cognitive and physical capacity, the diagnosis of dementia, very common in patient's with Down Syndrome by age 64, is not supported.    I have concern that Yvonne Tyler does have impaired abilities to understand complex information, retain that information, use or weigh the information as part of decision making, and then clearly communicate her decision.   P/ I believe that it would be appropriate for Yvonne Tyler caretakers, Yvonne Tyler and Yvonne Tyler, to request of courts to award guardianship for the best interests of Yvonne Tyler.  Guardianship would allow informed decisions, in the patient's best interest, about her person and property.

## 2020-01-16 NOTE — Assessment & Plan Note (Signed)
New complaint Patient with significant postural debility. Exaggerated lumbar lordosis with a flattened thoracic back.    APAP for pain Referral to PT for recommendations.  May benefit from imaging to access degree of spondylolisthesis, spondylolysis and possible bridging osteophytes.

## 2020-01-16 NOTE — Assessment & Plan Note (Signed)
Long-standing issue with episodic periods of worsening in blind individual  Would recommend that PCP consider trial of Tasimelteon, Ramelteon or 0.5 mg melatonin an hour before desired bed time.  It can take up to 3 month to entrain the circadian cycle to the desired sleep-wake cycle.

## 2020-01-16 NOTE — Addendum Note (Signed)
Addended byPerley Jain, Toma Arts D on: 01/16/2020 10:55 AM   Modules accepted: Orders

## 2020-01-30 ENCOUNTER — Ambulatory Visit: Payer: Medicaid Other | Attending: Family Medicine | Admitting: Audiologist

## 2020-01-30 ENCOUNTER — Other Ambulatory Visit: Payer: Self-pay

## 2020-01-30 DIAGNOSIS — H906 Mixed conductive and sensorineural hearing loss, bilateral: Secondary | ICD-10-CM | POA: Insufficient documentation

## 2020-01-30 NOTE — Procedures (Signed)
  Outpatient Audiology and [facility] [address] [place], [place]  [address] [phone]  AUDIOLOGICAL  EVALUATION  NAME: Yvonne Tyler     DOB:   1955/12/02      MRN: [id number]                                                                                     DATE: 01/30/2020     REFERENT: Garnette Gunner, MD STATUS: Outpatient DIAGNOSIS: Mixed Hearing Loss Bilateral     History: [name] was seen for an audiological evaluation. [name] was accompanied to the appointment by her cousin [name].  [name] is receiving a hearing evaluation due to concerns for hearing loss in both ears. [name] has down syndrome and is legally blind. Her cousin reported that [name] hears when she wants to, but over all it is hard to tell. [name] has limited speech.    Evaluation:   Otoscopy showed significant cerumen occlusion with no visible portion of the tympanic membrae, bilaterally  Tympanometry results were consistent with reduced middle ear function for the left ear and some normal function in the right ear,   Distortion Product Otoacoustic Emissions (DPOAE's) were not performed due to wax and abnormal middle ear function   Audiometric testing was completed using conventional audiometry with over the ear transducer. Speech Detection Thresholds were consistent with pure tone averages. Word Recognition was attempted, but responses were not reliable. Pure tone thresholds show moderate to severe normal mixed hearing loss in both ears. Test results are consistent with occlusion from the wax.   Results:  The test results were reviewed with [name] and her cousin [name]. [name] was given a handout with instructions for use of Debrox drops. [name] was given the option of drops or a referral to have the wax removed. She opted to try the drops first, then repeat testing to see if wax is cleared and hearing improves. If at next hearing test the wax is still present  [name] will be referred for removal with irrigation or instrument.   At this time [name] has a severe bilateral mixed hearing loss. Bone thresholds show that [name] has normal hearing sensitivity for most frequencies when sound is able to reach her inner ear. At this time there is a significant conductive component to the loss meaning that sound is not reaching her inner ear.   Recommendations: 1.   Use Debrox Drops as instructed to remove impacted cerumen. 2.   Return in one month for a hearing evaluation to see if ear canals are clear and hearing improves.    [name]  Audiologist, Au.D., CCC-A 01/30/2020  10:57 AM  Cc: Garnette Gunner, MD

## 2020-02-02 ENCOUNTER — Other Ambulatory Visit: Payer: Self-pay

## 2020-02-02 ENCOUNTER — Ambulatory Visit (AMBULATORY_SURGERY_CENTER): Payer: Medicaid Other | Admitting: *Deleted

## 2020-02-02 VITALS — Ht 59.0 in | Wt 112.0 lb

## 2020-02-02 DIAGNOSIS — Z1211 Encounter for screening for malignant neoplasm of colon: Secondary | ICD-10-CM

## 2020-02-02 NOTE — Progress Notes (Signed)

## 2020-02-12 ENCOUNTER — Ambulatory Visit (INDEPENDENT_AMBULATORY_CARE_PROVIDER_SITE_OTHER): Payer: Medicaid Other

## 2020-02-12 ENCOUNTER — Other Ambulatory Visit: Payer: Self-pay | Admitting: Gastroenterology

## 2020-02-12 DIAGNOSIS — Z1159 Encounter for screening for other viral diseases: Secondary | ICD-10-CM

## 2020-02-13 LAB — SARS CORONAVIRUS 2 (TAT 6-24 HRS): SARS Coronavirus 2: NEGATIVE

## 2020-02-16 ENCOUNTER — Ambulatory Visit (AMBULATORY_SURGERY_CENTER): Payer: Medicaid Other | Admitting: Gastroenterology

## 2020-02-16 ENCOUNTER — Other Ambulatory Visit: Payer: Self-pay

## 2020-02-16 ENCOUNTER — Encounter: Payer: Self-pay | Admitting: Gastroenterology

## 2020-02-16 VITALS — BP 124/72 | HR 62 | Temp 97.1°F | Resp 19 | Ht 59.0 in | Wt 112.0 lb

## 2020-02-16 DIAGNOSIS — Z1211 Encounter for screening for malignant neoplasm of colon: Secondary | ICD-10-CM | POA: Diagnosis not present

## 2020-02-16 MED ORDER — SODIUM CHLORIDE 0.9 % IV SOLN: 500.0000 mL | Freq: Once | INTRAVENOUS | Status: DC

## 2020-02-16 NOTE — Patient Instructions (Signed)
YOU HAD AN ENDOSCOPIC PROCEDURE TODAY AT THE [facility]:   Refer to the procedure report that was given to you for any specific questions about what was found during the examination.  If the procedure report does not answer your questions, please call your gastroenterologist to clarify.  If you requested that your care partner not be given the details of your procedure findings, then the procedure report has been included in a sealed envelope for you to review at your convenience later.  YOU SHOULD EXPECT: Some feelings of bloating in the abdomen. Passage of more gas than usual.  Walking can help get rid of the air that was put into your GI tract during the procedure and reduce the bloating. If you had a lower endoscopy (such as a colonoscopy or flexible sigmoidoscopy) you may notice spotting of blood in your stool or on the toilet paper. If you underwent a bowel prep for your procedure, you may not have a normal bowel movement for a few days.  Please Note:  You might notice some irritation and congestion in your nose or some drainage.  This is from the oxygen used during your procedure.  There is no need for concern and it should clear up in a day or so.  SYMPTOMS TO REPORT IMMEDIATELY:   Following lower endoscopy (colonoscopy or flexible sigmoidoscopy):  Excessive amounts of blood in the stool  Significant tenderness or worsening of abdominal pains  Swelling of the abdomen that is new, acute  Fever of 100F or higher  For urgent or emergent issues, a gastroenterologist can be reached at any hour by calling [phone]. Do not use MyChart messaging for urgent concerns.    DIET:  We do recommend a small meal at first, but then you may proceed to your regular diet.  Drink plenty of fluids but you should avoid alcoholic beverages for 24 hours.  ACTIVITY:  You should plan to take it easy for the rest of today and you should NOT DRIVE or use heavy machinery until tomorrow (because  of the sedation medicines used during the test).    FOLLOW UP: Our staff will call the number listed on your records 48-72 hours following your procedure to check on you and address any questions or concerns that you may have regarding the information given to you following your procedure. If we do not reach you, we will leave a message.  We will attempt to reach you two times.  During this call, we will ask if you have developed any symptoms of COVID 19. If you develop any symptoms (ie: fever, flu-like symptoms, shortness of breath, cough etc.) before then, please call [phone].  If you test positive for Covid 19 in the 2 weeks post procedure, please call and report this information to us.    If any biopsies were taken you will be contacted by phone or by letter within the next 1-3 weeks.  Please call us at [phone] if you have not heard about the biopsies in 3 weeks.    SIGNATURES/CONFIDENTIALITY: You and/or your care partner have signed paperwork which will be entered into your electronic medical record.  These signatures attest to the fact that that the information above on your After Visit Summary has been reviewed and is understood.  Full responsibility of the confidentiality of this discharge information lies with you and/or your care-partner. 

## 2020-02-16 NOTE — Progress Notes (Signed)
JB - VS  JB - Check-in  Pt is legally blind and has Down Syndrome.  Her cousin, [name], is pt's legal guardian is here with pt today and assisted her check-in. maw   Pt's states no medical or surgical changes since previsit or office visit.MAW

## 2020-02-16 NOTE — Progress Notes (Signed)
pt tolerated well. VSS. awake and to recovery. Report given to RN.  

## 2020-02-16 NOTE — Op Note (Signed)
[facility] Patient Name: Yvonne Tyler Procedure Date: 02/16/2020 10:28 AM MRN: [id number] Endoscopist: Thornton Park MD, MD Age: 64 Referring MD:  Date of Birth: 10/27/1955 Gender: Female Account #: [id number] Procedure:                Colonoscopy Indications:              Screening for colorectal malignant neoplasm, This                            is the patient's first colonoscopy                           No known family history of colon cancer or polyps Medicines:                Monitored Anesthesia Care Procedure:                Pre-Anesthesia Assessment:                           - Prior to the procedure, a History and Physical                            was performed, and patient medications and                            allergies were reviewed. The patient's tolerance of                            previous anesthesia was also reviewed. The risks                            and benefits of the procedure and the sedation                            options and risks were discussed with the patient.                            All questions were answered, and informed consent                            was obtained. Prior Anticoagulants: The patient has                            taken no previous anticoagulant or antiplatelet                            agents. ASA Grade Assessment: III - A patient with                            severe systemic disease. After reviewing the risks                            and benefits, the patient was deemed in  satisfactory condition to undergo the procedure.                           After obtaining informed consent, the colonoscope                            was passed under direct vision. Throughout the                            procedure, the patient's blood pressure, pulse, and                            oxygen saturations were monitored continuously. The                            Colonoscope was  introduced through the anus and                            advanced to the 3 cm into the ileum. A second                            forward view of the right colon was performed. The                            colonoscopy was performed without difficulty. The                            patient tolerated the procedure well. The quality                            of the bowel preparation was good. The terminal                            ileum, ileocecal valve, appendiceal orifice, and                            rectum were photographed. Scope In: 10:42:12 AM Scope Out: 10:57:40 AM Scope Withdrawal Time: 0 hours 10 minutes 46 seconds  Total Procedure Duration: 0 hours 15 minutes 28 seconds  Findings:                 The perianal and digital rectal examinations were                            normal.                           The entire examined colon appeared normal on direct                            and retroflexion views. Complications:            No immediate complications. Estimated Blood Loss:     Estimated blood loss: none. Impression:               - The entire  examined colon is normal on direct and                            retroflexion views.                           - No specimens collected. Recommendation:           - Patient has a contact number available for                            emergencies. The signs and symptoms of potential                            delayed complications were discussed with the                            patient. Return to normal activities tomorrow.                            Written discharge instructions were provided to the                            patient.                           - Resume previous diet.                           - Continue present medications.                           - Repeat colonoscopy in 10 years for screening                            purposes.                           - Emerging evidence supports eating a diet  of                            fruits, vegetables, grains, calcium, and yogurt                            while reducing red meat and alcohol may reduce the                            risk of colon cancer.                           - Thank you for allowing me to be involved in your                            colon cancer prevention. Tressia Danas MD, MD 02/16/2020 11:02:05 AM This report has been signed electronically.

## 2020-02-18 ENCOUNTER — Telehealth: Payer: Self-pay | Admitting: *Deleted

## 2020-02-18 ENCOUNTER — Telehealth: Payer: Self-pay

## 2020-02-18 NOTE — Telephone Encounter (Signed)
°  Follow up Call-  Call back number 02/16/2020  Post procedure Call Back phone  # #[phone] cell - veronica Judithann Sheen - cousin & legal guardian to pt  Permission to leave phone message Yes  Some recent data might be hidden     Patient questions:  Do you have a fever, pain , or abdominal swelling? No. Pain Score  0 *  Have you tolerated food without any problems? Yes.    Have you been able to return to your normal activities? Yes.    Do you have any questions about your discharge instructions: Diet   No. Medications  No. Follow up visit  No.  Do you have questions or concerns about your Care? No.  Actions: * If pain score is 4 or above: No action needed, pain <4.  1. Have you developed a fever since your procedure? no  2.   Have you had an respiratory symptoms (SOB or cough) since your procedure? no  3.   Have you tested positive for COVID 19 since your procedure no  4.   Have you had any family members/close contacts diagnosed with the COVID 19 since your procedure?  no   If yes to any of these questions please route to Laverna Peace, RN and Charlett Lango, RN

## 2020-02-18 NOTE — Telephone Encounter (Signed)
Follow up call made, left message. 

## 2020-02-27 ENCOUNTER — Ambulatory Visit: Payer: Medicaid Other | Attending: Obstetrics & Gynecology | Admitting: Audiologist

## 2020-02-27 ENCOUNTER — Other Ambulatory Visit: Payer: Self-pay

## 2020-02-27 DIAGNOSIS — R94128 Abnormal results of other function studies of ear and other special senses: Secondary | ICD-10-CM

## 2020-02-27 DIAGNOSIS — H748X3 Other specified disorders of middle ear and mastoid, bilateral: Secondary | ICD-10-CM | POA: Insufficient documentation

## 2020-02-27 DIAGNOSIS — H6123 Impacted cerumen, bilateral: Secondary | ICD-10-CM | POA: Insufficient documentation

## 2020-02-27 NOTE — Procedures (Signed)
  Outpatient Audiology and [facility] [address] [place], [place]  [address] [phone]  AUDIOLOGICAL  EVALUATION  NAME: Yvonne Tyler     DOB:   [birth year]      MRN: [id number]                                                                                     DATE: 02/27/2020     REFERENT: Katha Cabal, DO STATUS: Outpatient DIAGNOSIS: Occluding Cerumen Bilaterally    History: [name] was seen for an audiological evaluation. [name] was accompanied to the appointment by her cousin [name] who is the primary caretaker and historian.   Mekesha has down syndrome and is legally blind. [name] reported that [name] hears when she wants to, but over all it is hard to tell. [name] has limited speech. Her family has to talk very loud in order for her to respond.   [name] was last seen for a hearing evaluation on 01/30/20, which showed bilateral severe mixed hearing loss, most likely due to cerumen occlusion. Her cousin opted to try Debrox Drops to remove the wax. They returned today for a repeat hearing test and to see if the cerumen was removed by the use of the drops.   Evaluation:   Otoscopy showed occluding dry cerumen with no visibility of tympanic membranes, bilaterally  Tympanometry results were consistent with occluding cerumen, flat responses in both ears with small volume show sound is not bypassing cerumen.   Results:  The test results were reviewed with [name] and [name]. Debrox drops have not been successful at removing cerumen in either ear. [name] still has significant cerumen occluding both ear canals that needs to be removed with irrigation or instrument. [name] was advised that [name] will need to see a medical provider to have the wax removed from both ears.   Recommendations: 1. Referral to ENT Physician necessary due to impacted cerumen, bilaterally.  2. After removal of cerumen, a repeat hearing evaluation is needed either at  ENT Physician's or here with audiology.  [name]  Audiologist, Au.D., CCC-A 02/27/2020  12:09 PM  Cc: Katha Cabal, DO

## 2020-03-04 ENCOUNTER — Other Ambulatory Visit: Payer: Self-pay | Admitting: Family Medicine

## 2020-03-04 DIAGNOSIS — H6123 Impacted cerumen, bilateral: Secondary | ICD-10-CM

## 2020-03-04 NOTE — Progress Notes (Signed)
Per audiology recommendation, ENT referral placed.  Patient will need repeat hearing exam as well.

## 2020-04-09 ENCOUNTER — Encounter: Payer: Self-pay | Admitting: Podiatry

## 2020-04-09 ENCOUNTER — Ambulatory Visit (INDEPENDENT_AMBULATORY_CARE_PROVIDER_SITE_OTHER): Payer: Medicaid Other | Admitting: Podiatry

## 2020-04-09 ENCOUNTER — Other Ambulatory Visit: Payer: Self-pay

## 2020-04-09 DIAGNOSIS — B351 Tinea unguium: Secondary | ICD-10-CM

## 2020-04-09 DIAGNOSIS — M79674 Pain in right toe(s): Secondary | ICD-10-CM

## 2020-04-09 NOTE — Progress Notes (Signed)
Complaint:  Visit Type: Patient returns to my office for continued preventative foot care services. Complaint: Patient states" my nails have grown long and thick and become painful to walk and wear shoes"  The patient presents for preventative foot care services. No changes to ROS.  Patient presents to the office with her caregiver..  Podiatric Exam: Vascular: dorsalis pedis and posterior tibial pulses are palpable bilateral. Capillary return is immediate. Temperature gradient is WNL. Skin turgor WNL  Sensorium: Normal Semmes Weinstein monofilament test. Normal tactile sensation bilaterally. Nail Exam: Pt has thick disfigured discolored nails with subungual debris noted bilateral entire nail hallux through fifth toenails,  Especially right great toenail. Ulcer Exam: There is no evidence of ulcer or pre-ulcerative changes or infection. Orthopedic Exam: Muscle tone and strength are WNL. No limitations in general ROM. No crepitus or effusions noted. HAV  B/L.  Hammer toes  B/L. Skin: No Porokeratosis. No infection or ulcers  Diagnosis:  Onychomycosis, , Pain in right toe, pain in left toes  Treatment & Plan Procedures and Treatment: Consent by patient was obtained for treatment procedures.   Debridement of mycotic and hypertrophic toenails, 1 through 5 bilateral and clearing of subungual debris. No ulceration, no infection noted.  Return Visit-Office Procedure: Patient instructed to return to the office for a follow up visit 6   months for continued evaluation and treatment.    Rorey Bisson DPM 

## 2020-08-25 ENCOUNTER — Other Ambulatory Visit: Payer: Self-pay | Admitting: Family Medicine

## 2020-08-25 DIAGNOSIS — Z1231 Encounter for screening mammogram for malignant neoplasm of breast: Secondary | ICD-10-CM

## 2020-10-07 ENCOUNTER — Ambulatory Visit
Admission: RE | Admit: 2020-10-07 | Discharge: 2020-10-07 | Disposition: A | Discharge status: Home / Self Care | Payer: Medicaid Other | Source: Ambulatory Visit | Attending: Family Medicine | Admitting: Family Medicine

## 2020-10-07 ENCOUNTER — Other Ambulatory Visit: Payer: Self-pay

## 2020-10-07 DIAGNOSIS — Z1231 Encounter for screening mammogram for malignant neoplasm of breast: Secondary | ICD-10-CM

## 2020-10-15 ENCOUNTER — Encounter: Payer: Self-pay | Admitting: Podiatry

## 2020-10-15 ENCOUNTER — Ambulatory Visit (INDEPENDENT_AMBULATORY_CARE_PROVIDER_SITE_OTHER): Payer: Medicaid Other | Admitting: Podiatry

## 2020-10-15 ENCOUNTER — Other Ambulatory Visit: Payer: Self-pay

## 2020-10-15 DIAGNOSIS — B351 Tinea unguium: Secondary | ICD-10-CM | POA: Diagnosis not present

## 2020-10-15 DIAGNOSIS — M79674 Pain in right toe(s): Secondary | ICD-10-CM

## 2020-10-15 NOTE — Progress Notes (Signed)
Complaint:  Visit Type: Patient returns to my office for continued preventative foot care services. Complaint: Patient states" my nails have grown long and thick and become painful to walk and wear shoes"  The patient presents for preventative foot care services. No changes to ROS.  Patient presents to the office with her caregiver..  Podiatric Exam: Vascular: dorsalis pedis and posterior tibial pulses are palpable bilateral. Capillary return is immediate. Temperature gradient is WNL. Skin turgor WNL  Sensorium: Normal Semmes Weinstein monofilament test. Normal tactile sensation bilaterally. Nail Exam: Pt has thick disfigured discolored nails with subungual debris noted bilateral entire nail hallux through fifth toenails,  Especially right great toenail. Ulcer Exam: There is no evidence of ulcer or pre-ulcerative changes or infection. Orthopedic Exam: Muscle tone and strength are WNL. No limitations in general ROM. No crepitus or effusions noted. HAV  B/L.  Hammer toes  B/L. Skin: No Porokeratosis. No infection or ulcers  Diagnosis:  Onychomycosis, , Pain in right toe, pain in left toes  Treatment & Plan Procedures and Treatment: Consent by patient was obtained for treatment procedures.   Debridement of mycotic and hypertrophic toenails, 1 through 5 bilateral and clearing of subungual debris. No ulceration, no infection noted.  Return Visit-Office Procedure: Patient instructed to return to the office for a follow up visit 6   months for continued evaluation and treatment.    Helane Gunther DPM

## 2021-04-15 ENCOUNTER — Encounter: Payer: Self-pay | Admitting: Podiatry

## 2021-04-15 ENCOUNTER — Ambulatory Visit (INDEPENDENT_AMBULATORY_CARE_PROVIDER_SITE_OTHER): Payer: Medicaid Other | Admitting: Podiatry

## 2021-04-15 ENCOUNTER — Other Ambulatory Visit: Payer: Self-pay

## 2021-04-15 DIAGNOSIS — B351 Tinea unguium: Secondary | ICD-10-CM | POA: Diagnosis not present

## 2021-04-15 DIAGNOSIS — M79674 Pain in right toe(s): Secondary | ICD-10-CM | POA: Diagnosis not present

## 2021-04-15 NOTE — Progress Notes (Signed)
Complaint:  Visit Type: Patient returns to my office for continued preventative foot care services. Complaint: Patient states" my nails have grown long and thick and become painful to walk and wear shoes"  The patient presents for preventative foot care services. No changes to ROS.  Patient presents to the office with her caregiver..  Podiatric Exam: Vascular: dorsalis pedis and posterior tibial pulses are palpable bilateral. Capillary return is immediate. Temperature gradient is WNL. Skin turgor WNL  Sensorium: Normal Semmes Weinstein monofilament test. Normal tactile sensation bilaterally. Nail Exam: Pt has thick disfigured discolored nails with subungual debris noted bilateral entire nail hallux through fifth toenails,  Especially right great toenail. Ulcer Exam: There is no evidence of ulcer or pre-ulcerative changes or infection. Orthopedic Exam: Muscle tone and strength are WNL. No limitations in general ROM. No crepitus or effusions noted. HAV  B/L.  Hammer toes  B/L. Skin: No Porokeratosis. No infection or ulcers  Diagnosis:  Onychomycosis, , Pain in right toe, pain in left toes  Treatment & Plan Procedures and Treatment: Consent by patient was obtained for treatment procedures.   Debridement of mycotic and hypertrophic toenails, 1 through 5 bilateral and clearing of subungual debris. No ulceration, no infection noted.  Return Visit-Office Procedure: Patient instructed to return to the office for a follow up visit 6   months for continued evaluation and treatment.    Demareon Coldwell DPM 

## 2021-06-16 IMAGING — CT CT HEAD W/O CM
3 series · 15 of 46 positions shown, 18 images · non-contrast
Comparison: 06/10/2011 CT

CLINICAL DATA: 63-year-old female with acute head injury from fall
today. Initial encounter.

EXAM:
CT HEAD WITHOUT CONTRAST
TECHNIQUE: Contiguous axial images were obtained from the base of the skull
through the vertex without intravenous contrast.

[Series 2: head wo · axial · 0.43mm/px · z∈[-161,-41]mm · 9 of 29 slices shown, 12 images]
[im 3/29  brain]
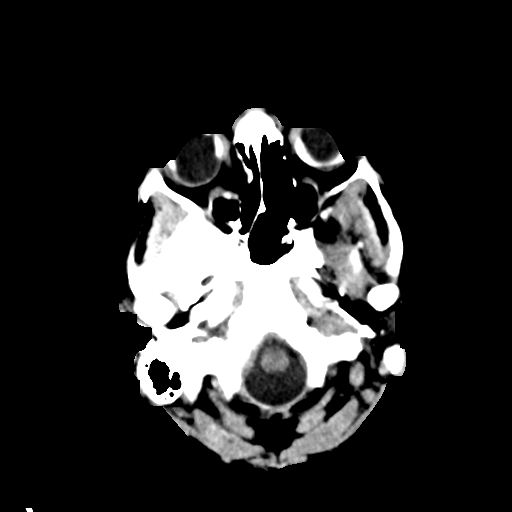
[im 3/29  bone]
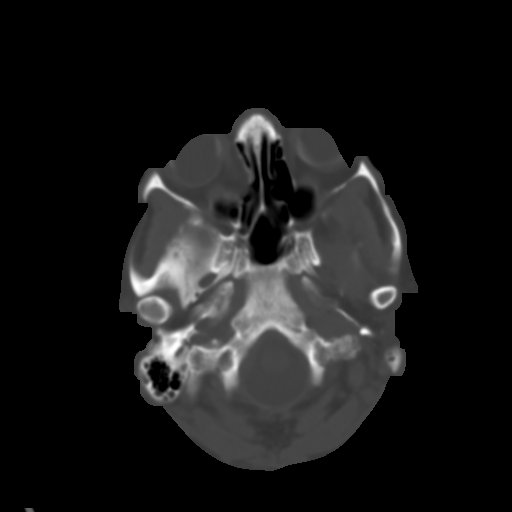
[im 6/29  brain]
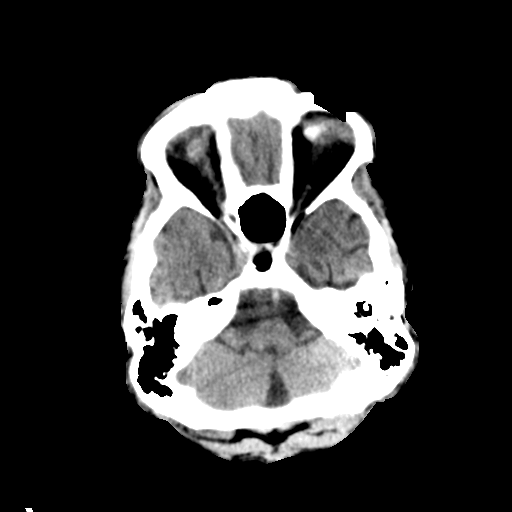
[im 9/29  brain]
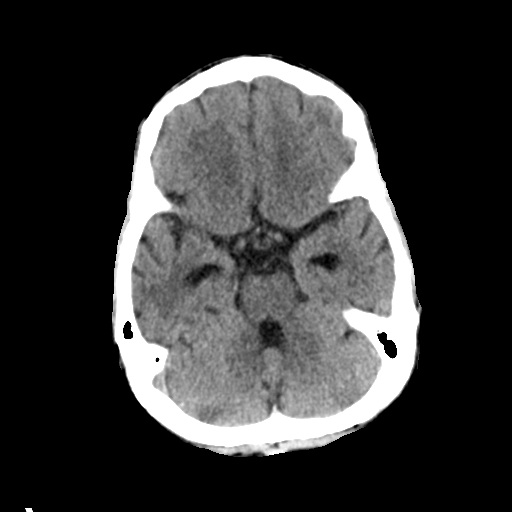
[im 12/29  brain]
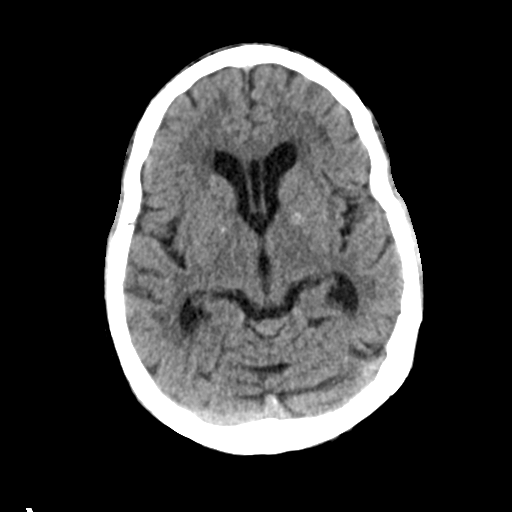
[im 15/29  brain]
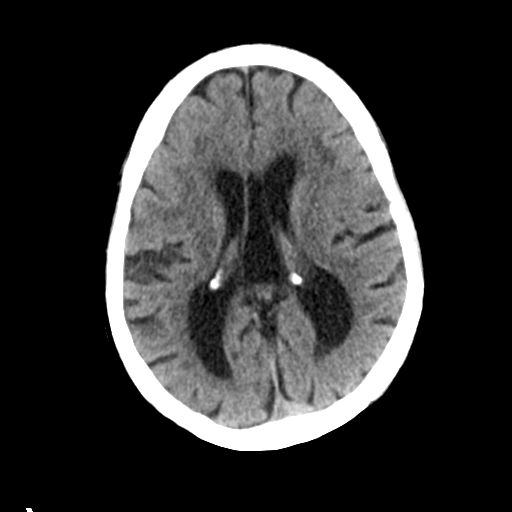
[im 15/29  bone]
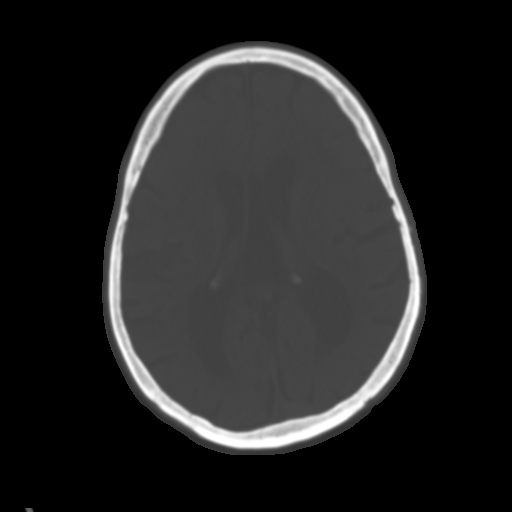
[im 18/29  brain]
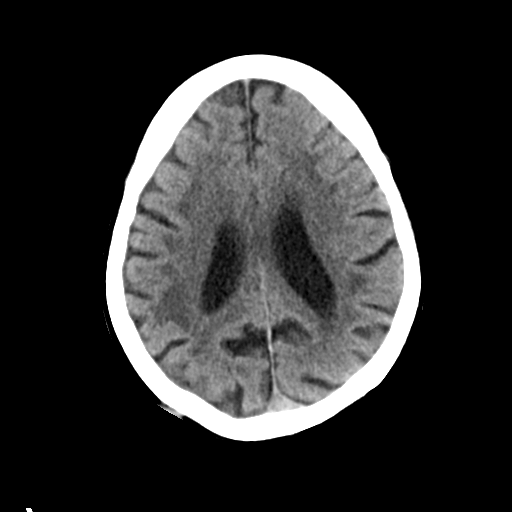
[im 21/29  brain]
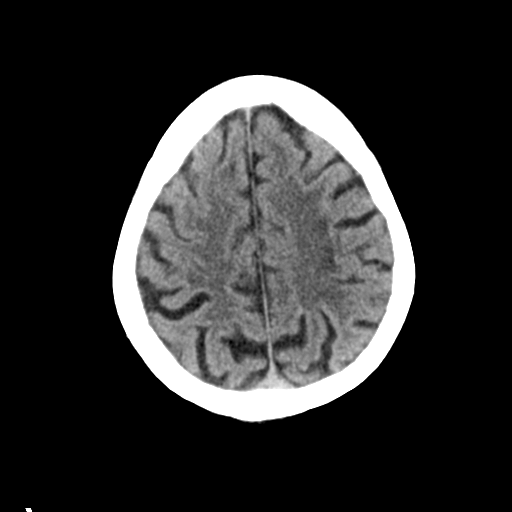
[im 24/29  brain]
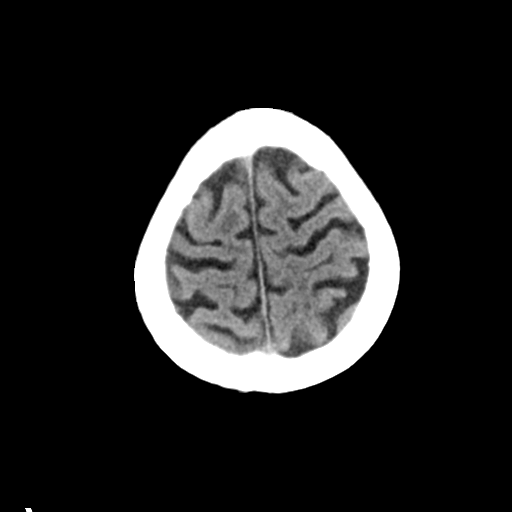
[im 27/29  brain]
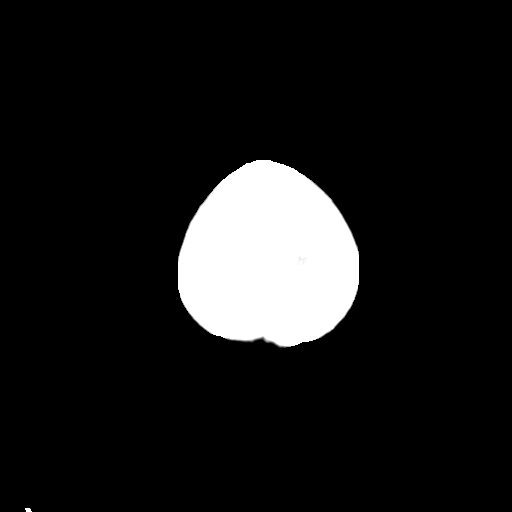
[im 27/29  bone]
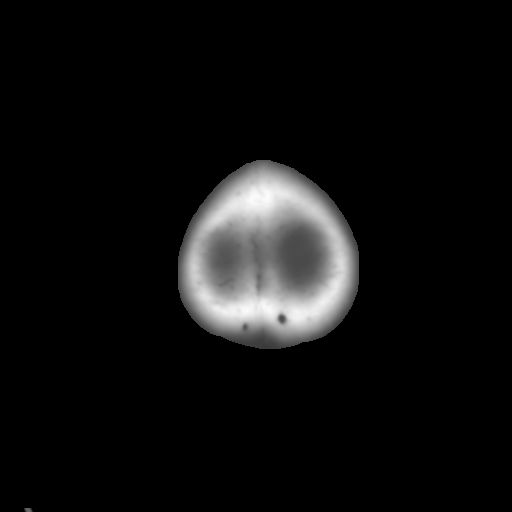

[Series 4: coronal soft tissue · coronal · 0.32mm/px · 3 of 59 slices shown]
[im 20/59  brain]
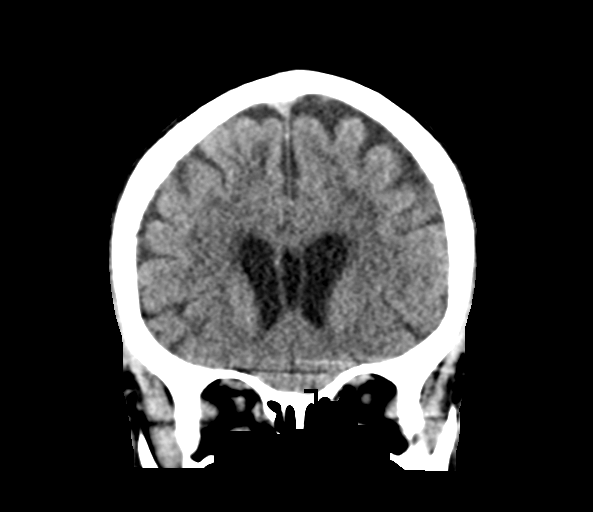
[im 26/59  brain]
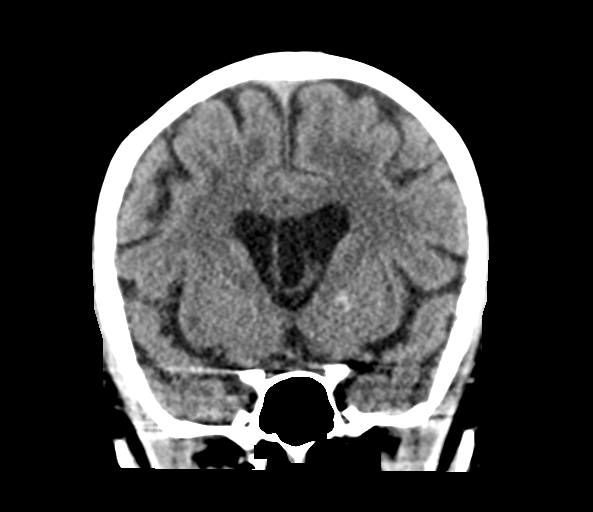
[im 33/59  brain]
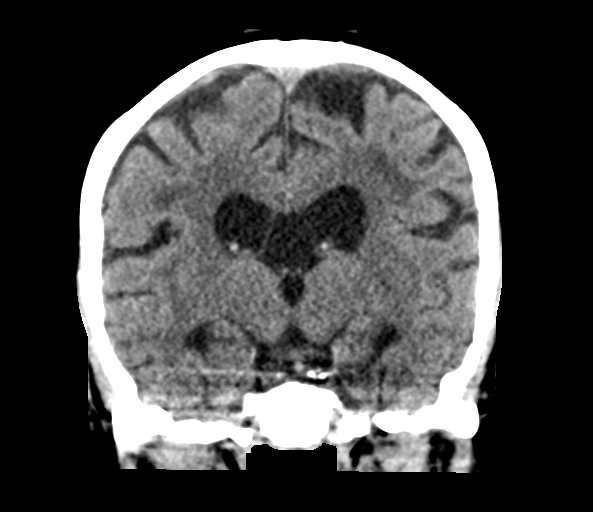

[Series 5: sagittal soft tissue · sagittal · 0.29mm/px · 3 of 48 slices shown]
[im 16/48  brain]
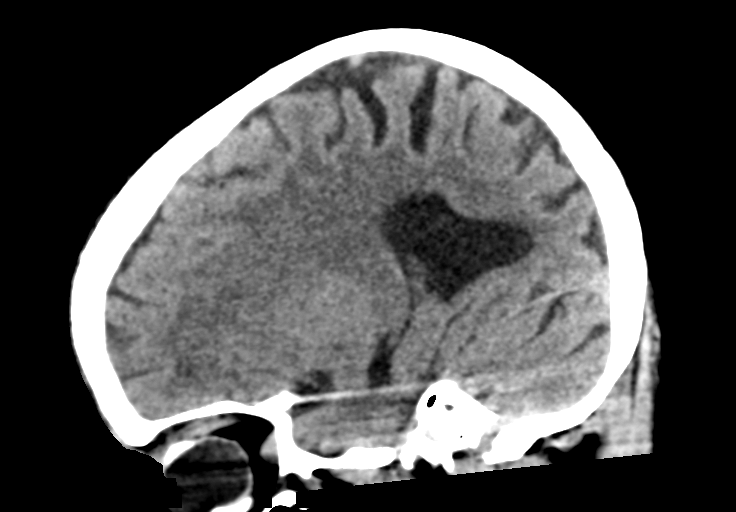
[im 24/48  brain]
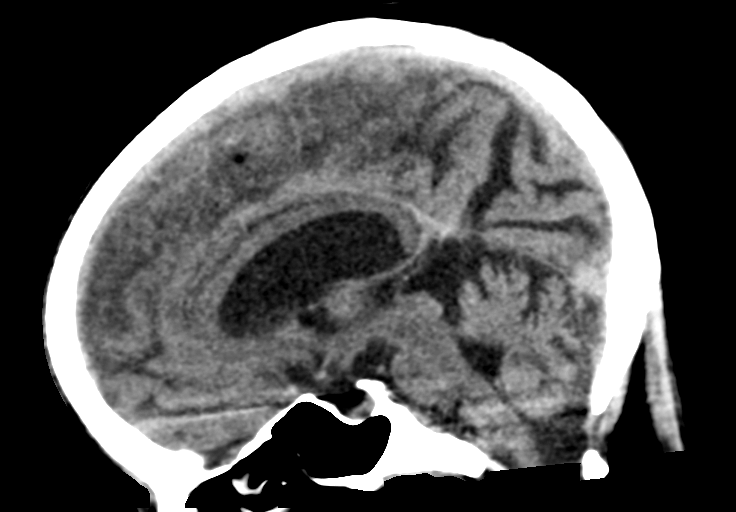
[im 32/48  brain]
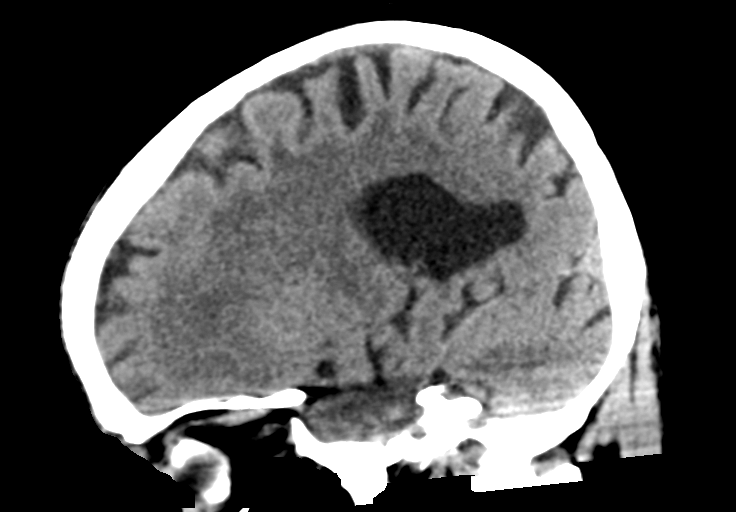

[15 of 46 positions shown; findings below may reference images not displayed]

FINDINGS: Brain: No evidence of acute infarction, hemorrhage, hydrocephalus,
extra-axial collection or mass lesion/mass effect.

Mild chronic small-vessel white matter ischemic changes again noted.

Vascular: No hyperdense vessel or unexpected calcification.

Skull: Normal. Negative for fracture or focal lesion.

Sinuses/Orbits: No acute finding.

Other: None.
IMPRESSION: 1. No evidence of acute intracranial abnormality.
2. Mild chronic small-vessel white matter ischemic changes.

## 2021-08-15 ENCOUNTER — Other Ambulatory Visit: Payer: Self-pay

## 2021-08-15 ENCOUNTER — Emergency Department (HOSPITAL_COMMUNITY): Payer: Medicare Other

## 2021-08-15 ENCOUNTER — Emergency Department (HOSPITAL_COMMUNITY)
Admission: EM | Admit: 2021-08-15 | Discharge: 2021-08-15 | Disposition: A | Discharge status: Home / Self Care | Payer: Medicare Other | Attending: Emergency Medicine | Admitting: Emergency Medicine

## 2021-08-15 ENCOUNTER — Encounter (HOSPITAL_COMMUNITY): Payer: Self-pay

## 2021-08-15 DIAGNOSIS — R031 Nonspecific low blood-pressure reading: Secondary | ICD-10-CM | POA: Insufficient documentation

## 2021-08-15 DIAGNOSIS — Z20822 Contact with and (suspected) exposure to covid-19: Secondary | ICD-10-CM | POA: Insufficient documentation

## 2021-08-15 DIAGNOSIS — R55 Syncope and collapse: Secondary | ICD-10-CM

## 2021-08-15 LAB — BASIC METABOLIC PANEL
Anion gap: 8 (ref 5–15)
BUN: 15 mg/dL (ref 8–23)
CO2: 26 mmol/L (ref 22–32)
Calcium: 8.7 mg/dL — ABNORMAL LOW (ref 8.9–10.3)
Chloride: 104 mmol/L (ref 98–111)
Creatinine, Ser: 1.09 mg/dL — ABNORMAL HIGH (ref 0.44–1.00)
GFR, Estimated: 56 mL/min — ABNORMAL LOW (ref >60)
Glucose, Bld: 166 mg/dL — ABNORMAL HIGH (ref 70–99)
Potassium: 4.2 mmol/L (ref 3.5–5.1)
Sodium: 138 mmol/L (ref 135–145)

## 2021-08-15 LAB — CBC WITH DIFFERENTIAL/PLATELET
Abs Immature Granulocytes: 0.02 10*3/uL (ref 0.00–0.07)
Basophils Absolute: 0.1 10*3/uL (ref 0.0–0.1)
Basophils Relative: 1 %
Eosinophils Absolute: 0 10*3/uL (ref 0.0–0.5)
Eosinophils Relative: 0 %
HCT: 40.2 % (ref 36.0–46.0)
Hemoglobin: 13.3 g/dL (ref 12.0–15.0)
Immature Granulocytes: 0 %
Lymphocytes Relative: 14 %
Lymphs Abs: 1.1 10*3/uL (ref 0.7–4.0)
MCH: 36.5 pg — ABNORMAL HIGH (ref 26.0–34.0)
MCHC: 33.1 g/dL (ref 30.0–36.0)
MCV: 110.4 fL — ABNORMAL HIGH (ref 80.0–100.0)
Monocytes Absolute: 0.5 10*3/uL (ref 0.1–1.0)
Monocytes Relative: 6 %
Neutro Abs: 6.4 10*3/uL (ref 1.7–7.7)
Neutrophils Relative %: 79 %
Platelets: 183 10*3/uL (ref 150–400)
RBC: 3.64 MIL/uL — ABNORMAL LOW (ref 3.87–5.11)
RDW: 13.2 % (ref 11.5–15.5)
WBC: 8.1 10*3/uL (ref 4.0–10.5)
nRBC: 0 % (ref 0.0–0.2)

## 2021-08-15 LAB — RESP PANEL BY RT-PCR (FLU A&B, COVID) ARPGX2
Influenza A by PCR: NEGATIVE
Influenza B by PCR: NEGATIVE
SARS Coronavirus 2 by RT PCR: NEGATIVE

## 2021-08-15 MED ORDER — LACTATED RINGERS IV SOLN: INTRAVENOUS | Status: DC

## 2021-08-15 MED ORDER — LACTATED RINGERS IV BOLUS
1000.0000 mL | Freq: Once | INTRAVENOUS | Status: AC
  Administered 2021-08-15: 15:00:00 1000 mL via INTRAVENOUS

## 2021-08-15 NOTE — ED Triage Notes (Signed)
Pt arrived via EMS, from home. States hx of downsyndrome, not able to articulate fully. Per family, pt stated she had to go to bathroom and needed assistance walking, near syncopal episode while walking. Pt sat down. Never lost consciousness. States similar sx in the past and was found to be hypotensive. On EMS and FD arrival, BP 100s palp. En route, BP dropped 80s/30s.    L AC 20 G 250 cc NS

## 2021-08-15 NOTE — ED Provider Notes (Addendum)
[facility]-EMERGENCY DEPT Provider Note   CSN: [id number] Arrival date & time: 08/15/21  1404     History No chief complaint on file.   Yvonne Tyler is a 65 y.o. female.  65 year old female presents from home after patient had a near syncopal event.  Patient was noted to have decreased blood pressure.  She does have a history of Down syndrome as well as blindness.  No recent illnesses have been reported.  When EMS arrived, patient's blood pressure was initially normal but then did decrease slightly.  Was given 250 cc of saline blood pressure has improved.  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   OB History   No obstetric history on file.     Family History  Problem Relation Age of Onset   Colon cancer Neg Hx    Esophageal cancer Neg Hx    Rectal cancer Neg Hx    Prostate cancer Neg Hx     Social History   Tobacco Use   Smoking status: Never   Smokeless tobacco: Never  Vaping Use   Vaping Use: Never used   Substance Use Topics   Alcohol use: Not Currently    Comment: Beer once a year on her birthday.   Drug use: No    Home Medications Prior to Admission medications   Medication Sig Start Date End Date Taking? Authorizing Provider  acetaminophen (TYLENOL) 325 MG tablet Take 2 tablets (650 mg total) by mouth every 6 (six) hours as needed. 07/10/19   LampteyBritta Mccreedy, MD  Cyanocobalamin (VITAMIN B 12) 500 MCG TABS Take 1,000 mcg by mouth daily. 12/05/19   Shirlean Mylar, MD    Allergies    Patient has no known allergies.  Review of Systems   Review of Systems  Unable to perform ROS: Other   Physical Exam Updated Vital Signs LMP  (LMP Unknown) Comment: Mentaly challenged  Physical Exam  ED Results / Procedures / Treatments   Labs (all labs ordered are listed, but only abnormal results are displayed) Labs Reviewed  CBC WITH DIFFERENTIAL/PLATELET  BASIC METABOLIC PANEL    EKG None  Radiology No results found.  Procedures Procedures   Medications Ordered in ED Medications  lactated ringers bolus 1,000 mL (has no administration in time range)  lactated ringers infusion (has no administration in time range)    ED Course  I have reviewed the triage vital signs and the nursing notes.  Pertinent labs & imaging results that were available  during my care of the patient were reviewed by me and considered in my medical decision making (see chart for details).    MDM Rules/Calculators/A&P                          Patient given IV hydration here.  Labs here are reassuring.  She is not orthostatic.  She has no symptoms.  She does not take any blood pressure medication or antihypertensives.  She has not been febrile.  She is not tachypneic or febrile.  Has had these episodes before in the past where she has transient hypotension without unclear etiology.  Will be discharged home  Final Clinical Impression(s) / ED Diagnoses Final diagnoses:  None    Rx / DC Orders ED  Discharge Orders     None        Lorre Nick, MD 08/15/21 1616    Lorre Nick, MD 08/15/21 1616

## 2021-08-15 NOTE — ED Triage Notes (Signed)
Pt to er room number 12, family at bedside, states that pt got up to go to the bathroom and almost passed out, states that when she has done this in the past it was due to low blood pressure.  Pt oriented to self.  Family at bedside, states that pt is currently at base line, oriented to self.

## 2021-08-28 ENCOUNTER — Emergency Department (HOSPITAL_COMMUNITY): Payer: Medicaid Other

## 2021-08-28 ENCOUNTER — Encounter (HOSPITAL_COMMUNITY): Payer: Self-pay

## 2021-08-28 ENCOUNTER — Other Ambulatory Visit: Payer: Self-pay

## 2021-08-28 ENCOUNTER — Inpatient Hospital Stay (HOSPITAL_COMMUNITY)
Admission: EM | Admit: 2021-08-28 | Discharge: 2021-08-31 | DRG: 871 | Disposition: A | Discharge status: Home / Self Care | Payer: Medicaid Other | Attending: Internal Medicine | Admitting: Internal Medicine

## 2021-08-28 DIAGNOSIS — E861 Hypovolemia: Secondary | ICD-10-CM

## 2021-08-28 DIAGNOSIS — Z23 Encounter for immunization: Secondary | ICD-10-CM

## 2021-08-28 DIAGNOSIS — E860 Dehydration: Secondary | ICD-10-CM | POA: Diagnosis present

## 2021-08-28 DIAGNOSIS — J189 Pneumonia, unspecified organism: Secondary | ICD-10-CM | POA: Diagnosis not present

## 2021-08-28 DIAGNOSIS — H548 Legal blindness, as defined in USA: Secondary | ICD-10-CM | POA: Diagnosis present

## 2021-08-28 DIAGNOSIS — I959 Hypotension, unspecified: Secondary | ICD-10-CM | POA: Diagnosis present

## 2021-08-28 DIAGNOSIS — Q909 Down syndrome, unspecified: Secondary | ICD-10-CM | POA: Diagnosis not present

## 2021-08-28 DIAGNOSIS — A419 Sepsis, unspecified organism: Principal | ICD-10-CM | POA: Diagnosis present

## 2021-08-28 DIAGNOSIS — Z681 Body mass index (BMI) 19 or less, adult: Secondary | ICD-10-CM

## 2021-08-28 DIAGNOSIS — I9589 Other hypotension: Secondary | ICD-10-CM

## 2021-08-28 DIAGNOSIS — E538 Deficiency of other specified B group vitamins: Secondary | ICD-10-CM | POA: Diagnosis present

## 2021-08-28 DIAGNOSIS — R6520 Severe sepsis without septic shock: Secondary | ICD-10-CM | POA: Diagnosis present

## 2021-08-28 DIAGNOSIS — E872 Acidosis, unspecified: Secondary | ICD-10-CM | POA: Diagnosis present

## 2021-08-28 DIAGNOSIS — Z9181 History of falling: Secondary | ICD-10-CM

## 2021-08-28 DIAGNOSIS — F79 Unspecified intellectual disabilities: Secondary | ICD-10-CM | POA: Diagnosis present

## 2021-08-28 DIAGNOSIS — R55 Syncope and collapse: Secondary | ICD-10-CM | POA: Diagnosis present

## 2021-08-28 DIAGNOSIS — E785 Hyperlipidemia, unspecified: Secondary | ICD-10-CM | POA: Diagnosis present

## 2021-08-28 DIAGNOSIS — I951 Orthostatic hypotension: Secondary | ICD-10-CM | POA: Diagnosis present

## 2021-08-28 DIAGNOSIS — Z20822 Contact with and (suspected) exposure to covid-19: Secondary | ICD-10-CM | POA: Diagnosis present

## 2021-08-28 LAB — CBC WITH DIFFERENTIAL/PLATELET
Abs Immature Granulocytes: 0.08 10*3/uL — ABNORMAL HIGH (ref 0.00–0.07)
Basophils Absolute: 0.1 10*3/uL (ref 0.0–0.1)
Basophils Relative: 0 %
Eosinophils Absolute: 0 10*3/uL (ref 0.0–0.5)
Eosinophils Relative: 0 %
HCT: 39.8 % (ref 36.0–46.0)
Hemoglobin: 13.1 g/dL (ref 12.0–15.0)
Immature Granulocytes: 1 %
Lymphocytes Relative: 5 %
Lymphs Abs: 0.8 10*3/uL (ref 0.7–4.0)
MCH: 36.2 pg — ABNORMAL HIGH (ref 26.0–34.0)
MCHC: 32.9 g/dL (ref 30.0–36.0)
MCV: 109.9 fL — ABNORMAL HIGH (ref 80.0–100.0)
Monocytes Absolute: 0.3 10*3/uL (ref 0.1–1.0)
Monocytes Relative: 2 %
Neutro Abs: 14.5 10*3/uL — ABNORMAL HIGH (ref 1.7–7.7)
Neutrophils Relative %: 92 %
Platelets: 279 10*3/uL (ref 150–400)
RBC: 3.62 MIL/uL — ABNORMAL LOW (ref 3.87–5.11)
RDW: 13.2 % (ref 11.5–15.5)
WBC: 15.7 10*3/uL — ABNORMAL HIGH (ref 4.0–10.5)
nRBC: 0 % (ref 0.0–0.2)

## 2021-08-28 LAB — COMPREHENSIVE METABOLIC PANEL
ALT: 16 U/L (ref 0–44)
AST: 21 U/L (ref 15–41)
Albumin: 3.5 g/dL (ref 3.5–5.0)
Alkaline Phosphatase: 43 U/L (ref 38–126)
Anion gap: 8 (ref 5–15)
BUN: 13 mg/dL (ref 8–23)
CO2: 24 mmol/L (ref 22–32)
Calcium: 8.6 mg/dL — ABNORMAL LOW (ref 8.9–10.3)
Chloride: 106 mmol/L (ref 98–111)
Creatinine, Ser: 1.03 mg/dL — ABNORMAL HIGH (ref 0.44–1.00)
GFR, Estimated: >60 mL/min (ref >60)
Glucose, Bld: 120 mg/dL — ABNORMAL HIGH (ref 70–99)
Potassium: 4 mmol/L (ref 3.5–5.1)
Sodium: 138 mmol/L (ref 135–145)
Total Bilirubin: 1.2 mg/dL (ref 0.3–1.2)
Total Protein: 7.2 g/dL (ref 6.5–8.1)

## 2021-08-28 LAB — CBG MONITORING, ED: Glucose-Capillary: 93 mg/dL (ref 70–99)

## 2021-08-28 LAB — PROTIME-INR
INR: 1.1 (ref 0.8–1.2)
Prothrombin Time: 14.7 seconds (ref 11.4–15.2)

## 2021-08-28 LAB — LACTIC ACID, PLASMA
Lactic Acid, Venous: 2.3 mmol/L (ref 0.5–1.9)
Lactic Acid, Venous: 2.4 mmol/L (ref 0.5–1.9)

## 2021-08-28 LAB — RESP PANEL BY RT-PCR (FLU A&B, COVID) ARPGX2
Influenza A by PCR: NEGATIVE
Influenza B by PCR: NEGATIVE
SARS Coronavirus 2 by RT PCR: NEGATIVE

## 2021-08-28 LAB — TROPONIN I (HIGH SENSITIVITY)
Troponin I (High Sensitivity): 3 ng/L (ref <18)
Troponin I (High Sensitivity): 4 ng/L (ref <18)

## 2021-08-28 LAB — APTT: aPTT: 27 seconds (ref 24–36)

## 2021-08-28 LAB — LIPASE, BLOOD: Lipase: 21 U/L (ref 11–51)

## 2021-08-28 MED ORDER — SODIUM CHLORIDE 0.9 % IV SOLN: INTRAVENOUS | Status: DC

## 2021-08-28 MED ORDER — LACTATED RINGERS IV BOLUS
1000.0000 mL | Freq: Once | INTRAVENOUS | Status: AC
  Administered 2021-08-28: 1000 mL via INTRAVENOUS

## 2021-08-28 MED ORDER — LACTATED RINGERS IV BOLUS
1000.0000 mL | Freq: Once | INTRAVENOUS | Status: AC
Start: 2021-08-28 — End: 2021-08-28
  Administered 2021-08-28: 1000 mL via INTRAVENOUS

## 2021-08-28 MED ORDER — IOHEXOL 350 MG/ML SOLN
80.0000 mL | Freq: Once | INTRAVENOUS | Status: AC | PRN
  Administered 2021-08-28: 80 mL via INTRAVENOUS

## 2021-08-28 MED ORDER — SODIUM CHLORIDE 0.9 % IV SOLN
2.0000 g | INTRAVENOUS | Status: DC
  Administered 2021-08-28 – 2021-08-30 (×3): 2 g via INTRAVENOUS
  Filled 2021-08-28 (×3): qty 20

## 2021-08-28 MED ORDER — LACTATED RINGERS IV BOLUS
500.0000 mL | Freq: Once | INTRAVENOUS | Status: AC
  Administered 2021-08-28: 500 mL via INTRAVENOUS

## 2021-08-28 MED ORDER — SODIUM CHLORIDE 0.9 % IV SOLN
500.0000 mg | INTRAVENOUS | Status: DC
  Administered 2021-08-28 – 2021-08-30 (×2): 500 mg via INTRAVENOUS
  Filled 2021-08-28 (×2): qty 5

## 2021-08-28 NOTE — ED Triage Notes (Signed)
Patient BIBA. Family witnessed a seizure like event lasting for 10-15 sec. where patient lost consciousness  and fell to ground. Did not hit their head. Lives at home with family.  Patient has Downs Syndrome, is AO to baseline. Hx of 1x past seizure.  Has been vomiting.

## 2021-08-28 NOTE — Sepsis Progress Note (Signed)
Elink following for Sepsis Protocol 

## 2021-08-28 NOTE — H&P (Signed)
History and Physical    Yvonne Tyler [id number] DOB: Jan 11, 1956 DOA: 08/28/2021  PCP: Lyndee Hensen, DO  Patient coming from: home  I have personally briefly reviewed patient's old medical records in [place]  Chief Complaint: seizure like activity  HPI: Yvonne Tyler is a 66 y.o. female with medical history significant for blindness and Down syndrome who presents with concerns of seizure-like activity and syncope.  Patient's legal guardian at bedside provides history.  Patient lives with her legal guardian and legal guardian's sister and mother.  Reportedly they heard her fall in the bedroom today.  Helped her back into her bed and noticed she was foaming in the mouth, shaking and seemed like she was having a seizure.  Also had brief loss of consciousness.  She had a new cough for the past week.  Had episode of nausea and vomiting today.  No diarrhea or complaints of abdominal pain.  No shortness of breath.  No fevers.  Patient has history of syncope and was seen in the ED several weeks ago.  She was noted to be mildly hypotensive at that time which improved with IV hydration and was discharged home.  ED Course: She had temperature of 90 9.27F, initially hypotensive down to 80/40 which improved to 110/58 following 2 L of IV LR bolus.  Heart rate of 60, RR 23. WBC of 15.7, normal hemoglobin.  Lactate of 2.4. Sodium 138, K of 4, creatinine of 1.03, BG of 120  Chest CTA of the chest and abdomen was obtained showing right upper quadrant pneumonia.  Negative for pulmonary embolism.  Possible colitis.  Negative for flu and COVID PCR.  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  Able to do all her activities of ADLs independently.  Has to walk holding onto walls due to her blindness.  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   Vitamin B12 deficiency 01/16/2020    History reviewed. No pertinent surgical history.   reports that she has never smoked. She has never used smokeless tobacco. She reports that she does not currently use alcohol. She reports that she does not use drugs. Social History  No Known Allergies  Family History  Problem Relation Age of Onset   Colon cancer Neg Hx    Esophageal cancer Neg Hx    Rectal cancer Neg Hx    Prostate cancer Neg Hx      Prior to Admission medications   Medication Sig Start Date End Date Taking? Authorizing Provider  Emollient (EUCERIN ADVANCED REPAIR EX) Apply 1 application topically as needed (Rash on back).   Yes [provider]  Misc Natural Products (ZARBEES COUGH+IMMUNE) SYRP Take 1 Dose/kg by mouth as needed (cough).   Yes [provider]  acetaminophen (TYLENOL) 325 MG tablet Take 2 tablets (650 mg total) by mouth every 6 (six) hours as needed. Patient not taking: Reported on 08/15/2021 07/10/19   Chase Picket, MD  Cyanocobalamin (VITAMIN B 12) 500 MCG TABS Take 1,000 mcg by mouth daily. Patient not taking: Reported on 08/15/2021  12/05/19   Gladys Damme, MD    Physical Exam: Vitals:   08/28/21 2115 08/28/21 2130 08/28/21 2215 08/28/21 2230  BP: (!) 84/73 122/63  (!) 111/58  Pulse: 68 63 65 69  Resp: (!) 21 15 17 17   Temp:      TempSrc:      SpO2: 100% 96% 97% 100%  Weight:      Height:        Constitutional: NAD, calm, comfortable, nontoxic appearing elderly female sitting upright in bed Vitals:   08/28/21 2115 08/28/21 2130 08/28/21 2215 08/28/21 2230  BP: (!) 84/73 122/63  (!) 111/58  Pulse: 68 63 65 69  Resp: (!) 21 15 17 17   Temp:      TempSrc:       SpO2: 100% 96% 97% 100%  Weight:      Height:       Eyes: Opacity of the left lens ENMT: Mucous membranes are moist.  Neck: normal, supple Respiratory: clear to auscultation bilaterally, no wheezing, no crackles. Normal respiratory effort on room air. No accessory muscle use.  Cardiovascular: Regular rate and rhythm, no murmurs / rubs / gallops. No extremity edema.  Abdomen: no tenderness, Bowel sounds positive.  Musculoskeletal: no clubbing / cyanosis. No joint deformity upper and lower extremities.  Normal muscle tone.  Skin: no rashes, lesions, ulcers.  Neurologic: CN 2-12 grossly intact.  Patient able to say her name and birthday although her speech is mumbled which per family is at her baseline.  She is able to follow simple commands.  Lower extremity strength 5 out of 5.   Psychiatric: Normal mood.   Labs on Admission: I have personally reviewed following labs and imaging studies  CBC: Recent Labs  Lab 08/28/21 1933  WBC 15.7*  NEUTROABS 14.5*  HGB 13.1  HCT 39.8  MCV 109.9*  PLT 123XX123   Basic Metabolic Panel: Recent Labs  Lab 08/28/21 1933  NA 138  K 4.0  CL 106  CO2 24  GLUCOSE 120*  BUN 13  CREATININE 1.03*  CALCIUM 8.6*   GFR: Estimated Creatinine Clearance: 39.1 mL/min (A) (by C-G formula based on SCr of 1.03 mg/dL (H)). Liver Function Tests: Recent Labs  Lab 08/28/21 1933  AST 21  ALT 16  ALKPHOS 43  BILITOT 1.2  PROT 7.2  ALBUMIN 3.5   Recent Labs  Lab 08/28/21 1933  LIPASE 21   No results for input(s): AMMONIA in the last 168 hours. Coagulation Profile: Recent Labs  Lab 08/28/21 1933  INR 1.1   Cardiac Enzymes: No results for input(s): CKTOTAL, CKMB, CKMBINDEX, TROPONINI in the last 168 hours. BNP (last 3 results) No results for input(s): PROBNP in the last 8760 hours. HbA1C: No results for input(s): HGBA1C in the last 72 hours. CBG: Recent Labs  Lab 08/28/21 1953  GLUCAP 93   Lipid Profile: No results for input(s): CHOL,  HDL, LDLCALC, TRIG, CHOLHDL, LDLDIRECT in the last 72 hours. Thyroid Function Tests: No results for input(s): TSH, T4TOTAL, FREET4, T3FREE, THYROIDAB in the last 72 hours. Anemia Panel: No results for input(s): VITAMINB12, FOLATE, FERRITIN, TIBC, IRON, RETICCTPCT in the last 72 hours. Urine analysis:    Component Value Date/Time   COLORURINE YELLOW 06/10/2011 1552   APPEARANCEUR CLEAR 06/10/2011 1552   LABSPEC 1.010 06/10/2011 1552   PHURINE 6.5 06/10/2011 1552   GLUCOSEU NEGATIVE 06/10/2011 1552   HGBUR NEGATIVE 06/10/2011 1552   BILIRUBINUR NEGATIVE 06/10/2011 1552   KETONESUR NEGATIVE 06/10/2011 1552   PROTEINUR  NEGATIVE 06/10/2011 1552   UROBILINOGEN 0.2 06/10/2011 1552   NITRITE NEGATIVE 06/10/2011 1552   LEUKOCYTESUR NEGATIVE 06/10/2011 1552    Radiological Exams on Admission: CT HEAD WO CONTRAST (5MM)  Result Date: 08/28/2021 CLINICAL DATA:  Seizure, new-onset, no history of trauma. Status post fall EXAM: CT HEAD WITHOUT CONTRAST TECHNIQUE: Contiguous axial images were obtained from the base of the skull through the vertex without intravenous contrast. COMPARISON:  CT head 11/08/2019 BRAIN: BRAIN Patchy and confluent areas of decreased attenuation are noted throughout the deep and periventricular white matter of the cerebral hemispheres bilaterally, compatible with chronic microvascular ischemic disease. No evidence of large-territorial acute infarction. No parenchymal hemorrhage. No mass lesion. No extra-axial collection. No mass effect or midline shift. No hydrocephalus. Cavum septum pellucidum variant. Basilar cisterns are patent. Vascular: No hyperdense vessel. Skull: No acute fracture or focal lesion. Sinuses/Orbits: Paranasal sinuses and mastoid air cells are clear. Bilateral scleral buckle. Otherwise orbits are unremarkable. Other: None. IMPRESSION: No acute intracranial abnormality. Electronically Signed   By: Iven Finn M.D.   On: 08/28/2021 22:18   CT Angio Chest PE W  and/or Wo Contrast  Result Date: 08/28/2021 CLINICAL DATA:  Pulmonary embolism (PE) suspected, high prob; Nausea/vomiting Sepsis EXAM: CT ANGIOGRAPHY CHEST CT ABDOMEN AND PELVIS WITH CONTRAST TECHNIQUE: Multidetector CT imaging of the chest was performed using the standard protocol during bolus administration of intravenous contrast. Multiplanar CT image reconstructions and MIPs were obtained to evaluate the vascular anatomy. Multidetector CT imaging of the abdomen and pelvis was performed using the standard protocol during bolus administration of intravenous contrast. CONTRAST:  27mL OMNIPAQUE IOHEXOL 350 MG/ML SOLN COMPARISON:  None. FINDINGS: CTA CHEST FINDINGS Cardiovascular: Satisfactory opacification of the pulmonary arteries to the segmental level. No evidence of pulmonary embolism. Enlarged left and right main pulmonary arteries. Enlarged right atrium. No significant pericardial effusion. The thoracic aorta is normal in caliber. No atherosclerotic plaque of the thoracic aorta. No coronary artery calcifications. Mediastinum/Nodes: No pneumomediastinum. No mediastinal hematoma formation. No enlarged mediastinal, hilar, or axillary lymph nodes. Thyroid gland, trachea, and esophagus demonstrate no significant findings. Lungs/Pleura: Peribronchovascular consolidative airspace opacities within the anterior inferior right upper lobe. No pulmonary nodule. No pulmonary mass. No pulmonary contusion or laceration. No pneumatocele formation. No pleural effusion. No pneumothorax. Musculoskeletal: No abdominal wall hernia or abnormality. No suspicious lytic or blastic osseous lesions. No acute displaced fracture. Multilevel degenerative changes of the spine. Review of the MIP images confirms the above findings. CT ABDOMEN and PELVIS FINDINGS Liver: Not enlarged. No focal lesion. No laceration or subcapsular hematoma. Biliary System: The gallbladder is otherwise unremarkable with no radio-opaque gallstones. No biliary  ductal dilatation. Pancreas: Normal pancreatic contour. No main pancreatic duct dilatation. Spleen: Not enlarged. No focal lesion. No laceration, subcapsular hematoma, or vascular injury. Adrenal Glands: No nodularity bilaterally. Kidneys: Bilateral kidneys enhance symmetrically. No hydronephrosis. No contusion, laceration, or subcapsular hematoma. No injury to the vascular structures or collecting systems. No hydroureter. The urinary bladder is unremarkable. On delayed imaging, there is no urothelial wall thickening and there are no filling defects in the opacified portions of the bilateral collecting systems or ureters. Bowel: Under distension of the ascending colon with query bowel thickening. No large bowel dilatation. No small bowel wall thickening or dilatation. The appendix is unremarkable. Mesentery, Omentum, and Peritoneum: No simple free fluid ascites. No pneumoperitoneum. No hemoperitoneum. No mesenteric hematoma identified. No organized fluid collection. Pelvic Organs: The uterus and bilateral adnexal regions are unremarkable. Lymph Nodes: No abdominal, pelvic,  inguinal lymphadenopathy. Vasculature: No abdominal aorta or iliac aneurysm. No active contrast extravasation or pseudoaneurysm. Musculoskeletal: No significant soft tissue hematoma. No acute pelvic fracture. No spinal fracture. Severe degenerative changes of the L4-L5 L5-S1 levels with intervertebral disc space vacuum phenomenon. Review of the MIP images confirms the above findings. IMPRESSION: 1. No pulmonary embolus. 2. Right upper lobe aspiration pneumonia. 3. Enlarged left and right main pulmonary arteries suggestive of pulmonary hypertension. 4. Enlarged right atrium. 5. Under distension of the ascending colon with query mild bowel thickening. Correlate clinically for colitis. 6.  No acute traumatic injury to the chest, abdomen, or pelvis. 7. No acute fracture or traumatic malalignment of the thoracic or lumbar spine. Electronically Signed    By: Iven Finn M.D.   On: 08/28/2021 22:37   CT ABDOMEN PELVIS W CONTRAST  Result Date: 08/28/2021 CLINICAL DATA:  Pulmonary embolism (PE) suspected, high prob; Nausea/vomiting Sepsis EXAM: CT ANGIOGRAPHY CHEST CT ABDOMEN AND PELVIS WITH CONTRAST TECHNIQUE: Multidetector CT imaging of the chest was performed using the standard protocol during bolus administration of intravenous contrast. Multiplanar CT image reconstructions and MIPs were obtained to evaluate the vascular anatomy. Multidetector CT imaging of the abdomen and pelvis was performed using the standard protocol during bolus administration of intravenous contrast. CONTRAST:  43mL OMNIPAQUE IOHEXOL 350 MG/ML SOLN COMPARISON:  None. FINDINGS: CTA CHEST FINDINGS Cardiovascular: Satisfactory opacification of the pulmonary arteries to the segmental level. No evidence of pulmonary embolism. Enlarged left and right main pulmonary arteries. Enlarged right atrium. No significant pericardial effusion. The thoracic aorta is normal in caliber. No atherosclerotic plaque of the thoracic aorta. No coronary artery calcifications. Mediastinum/Nodes: No pneumomediastinum. No mediastinal hematoma formation. No enlarged mediastinal, hilar, or axillary lymph nodes. Thyroid gland, trachea, and esophagus demonstrate no significant findings. Lungs/Pleura: Peribronchovascular consolidative airspace opacities within the anterior inferior right upper lobe. No pulmonary nodule. No pulmonary mass. No pulmonary contusion or laceration. No pneumatocele formation. No pleural effusion. No pneumothorax. Musculoskeletal: No abdominal wall hernia or abnormality. No suspicious lytic or blastic osseous lesions. No acute displaced fracture. Multilevel degenerative changes of the spine. Review of the MIP images confirms the above findings. CT ABDOMEN and PELVIS FINDINGS Liver: Not enlarged. No focal lesion. No laceration or subcapsular hematoma. Biliary System: The gallbladder is  otherwise unremarkable with no radio-opaque gallstones. No biliary ductal dilatation. Pancreas: Normal pancreatic contour. No main pancreatic duct dilatation. Spleen: Not enlarged. No focal lesion. No laceration, subcapsular hematoma, or vascular injury. Adrenal Glands: No nodularity bilaterally. Kidneys: Bilateral kidneys enhance symmetrically. No hydronephrosis. No contusion, laceration, or subcapsular hematoma. No injury to the vascular structures or collecting systems. No hydroureter. The urinary bladder is unremarkable. On delayed imaging, there is no urothelial wall thickening and there are no filling defects in the opacified portions of the bilateral collecting systems or ureters. Bowel: Under distension of the ascending colon with query bowel thickening. No large bowel dilatation. No small bowel wall thickening or dilatation. The appendix is unremarkable. Mesentery, Omentum, and Peritoneum: No simple free fluid ascites. No pneumoperitoneum. No hemoperitoneum. No mesenteric hematoma identified. No organized fluid collection. Pelvic Organs: The uterus and bilateral adnexal regions are unremarkable. Lymph Nodes: No abdominal, pelvic, inguinal lymphadenopathy. Vasculature: No abdominal aorta or iliac aneurysm. No active contrast extravasation or pseudoaneurysm. Musculoskeletal: No significant soft tissue hematoma. No acute pelvic fracture. No spinal fracture. Severe degenerative changes of the L4-L5 L5-S1 levels with intervertebral disc space vacuum phenomenon. Review of the MIP images confirms the above findings. IMPRESSION: 1. No pulmonary  embolus. 2. Right upper lobe aspiration pneumonia. 3. Enlarged left and right main pulmonary arteries suggestive of pulmonary hypertension. 4. Enlarged right atrium. 5. Under distension of the ascending colon with query mild bowel thickening. Correlate clinically for colitis. 6.  No acute traumatic injury to the chest, abdomen, or pelvis. 7. No acute fracture or traumatic  malalignment of the thoracic or lumbar spine. Electronically Signed   By: Iven Finn M.D.   On: 08/28/2021 22:37   DG Chest Port 1 View  Result Date: 08/28/2021 CLINICAL DATA:  Status post seizure. EXAM: PORTABLE CHEST 1 VIEW COMPARISON:  August 15, 2021 FINDINGS: There is stable mild to moderate severity cardiomegaly with stable prominence of the perihilar vessels. Both lungs are clear. The visualized skeletal structures are unremarkable. IMPRESSION: Stable cardiomegaly without acute or active cardiopulmonary disease. Electronically Signed   By: Virgina Norfolk M.D.   On: 08/28/2021 20:17      Assessment/Plan  Hypotension -Improved following aggressive fluid hydration.  Continue IV maintenance fluid overnight   Syncope/LOC -There was concern from family for possible seizure although suspect more likely due to vasovagal syncope from hypotension. -Will obtain EEG and keep on continuous telemetry  Sepsis secondary to Right upper lobe community-acquired pneumonia -pt presented with tachypnea, leukocytosis with lactate of 2.4 -Continue IV Rocephin and azithromycin -Continue IV fluid hydration as above  Down syndrome/blindness Patient at baseline knows her name and birthday.  She is independent and able to do her ADLs independently.  DVT prophylaxis:.Lovenox Code Status:Limited-no mechanical ventilatior Family Communication: Plan discussed with legal guardian at bedside  disposition Plan: Home with at least 2 midnight stays  Consults called:  Admission status: inpatient   Level of care: Progressive  Status is: Observation  The patient remains OBS appropriate and will d/c before 2 midnights.        Orene Desanctis DO [facility]   If 7PM-7AM, please contact night-coverage [email or web address]   08/28/2021, 11:14 PM

## 2021-08-28 NOTE — ED Provider Notes (Signed)
[facility] DEPT Provider Note   CSN: [id number] Arrival date & time: 08/28/21  1842  LEVEL 5 CAVEAT - DOWNS SYNDROME  History  Chief Complaint  Patient presents with   Seizures   Fall    Yvonne Tyler is a 66 y.o. female.  HPI 66 year old female with a history of Down syndrome presents from home by EMS with seizure like activity.  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  The patient chronically has difficulty getting out of her bed and often falls.  The patient's mother heard the patient fall today and the cousin went to go check on the patient and she seemed to be unresponsive.  Put her on the bed from the floor and then she seemed to be foaming at the mouth.  She did not notice any actual seizure-like activity.  When the patient woke up she seemed to be talking fairly quickly.  She was shaking when EMS got there but she was also awake and talking.  The patient has been dealing with a cough for the last 2 or 3 days.  No known fevers.  She has vomited a couple times since waking up from this episode.  Patient to me does endorse a cough as well as some vomiting today.  She denies headache or neck pain   Home Medications Prior to Admission medications   Medication Sig Start Date End Date Taking? Authorizing Provider  Emollient (EUCERIN ADVANCED REPAIR EX) Apply 1 application topically as needed (Rash on back).   Yes [provider]  Misc Natural Products (ZARBEES COUGH+IMMUNE) SYRP Take 1 Dose/kg by mouth as needed (cough).   Yes [provider]  acetaminophen (TYLENOL) 325 MG tablet Take 2 tablets (650 mg total) by mouth every 6 (six) hours as needed. Patient not taking: Reported on 08/15/2021 07/10/19   Chase Picket, MD  Cyanocobalamin (VITAMIN B 12) 500 MCG TABS Take 1,000 mcg by mouth daily. Patient not taking: Reported on  08/15/2021 12/05/19   Gladys Damme, MD      Allergies    Patient has no known allergies.    Review of Systems   Review of Systems  Unable to perform ROS: Mental status change   Physical Exam Updated Vital Signs BP (!) 111/58    Pulse 69    Temp (!) 97.3 F (36.3 C) (Rectal)    Resp 17    Ht 5' (1.524 m)    Wt 46.3 kg    LMP  (LMP Unknown) Comment: Mentaly challenged   SpO2 100%    BMI 19.92 kg/m  Physical Exam Vitals and nursing note reviewed.  Constitutional:      Appearance: She is well-developed.  HENT:     Head: Normocephalic and atraumatic.     Right Ear: External ear normal.     Left Ear: External ear normal.     Nose: Nose normal.  Eyes:     General:        Right eye: No discharge.        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     Heart sounds: Normal heart sounds.  Pulmonary:     Effort: Pulmonary effort is normal.     Breath sounds: Normal breath sounds.  Abdominal:     Palpations: Abdomen  is soft.     Tenderness: There is abdominal tenderness in the epigastric area.  Musculoskeletal:     Cervical back: Normal range of motion and neck supple. No rigidity.  Skin:    General: Skin is warm and dry.  Neurological:     Mental Status: She is alert.     Comments: Awake, alert, a little sleepy Very difficult to understand, but speech is baseline per family Equal strength in all 4 extremities  Psychiatric:        Mood and Affect: Mood is not anxious.    ED Results / Procedures / Treatments   Labs (all labs ordered are listed, but only abnormal results are displayed) Labs Reviewed  LACTIC ACID, PLASMA - Abnormal; Notable for the following components:      Result Value   Lactic Acid, Venous 2.3 (*)    All other components within normal limits  LACTIC ACID, PLASMA - Abnormal; Notable for the following components:   Lactic Acid, Venous 2.4 (*)    All other components within normal  limits  COMPREHENSIVE METABOLIC PANEL - Abnormal; Notable for the following components:   Glucose, Bld 120 (*)    Creatinine, Ser 1.03 (*)    Calcium 8.6 (*)    All other components within normal limits  CBC WITH DIFFERENTIAL/PLATELET - Abnormal; Notable for the following components:   WBC 15.7 (*)    RBC 3.62 (*)    MCV 109.9 (*)    MCH 36.2 (*)    Neutro Abs 14.5 (*)    Abs Immature Granulocytes 0.08 (*)    All other components within normal limits  RESP PANEL BY RT-PCR (FLU A&B, COVID) [id number]  CULTURE, BLOOD (ROUTINE X 2)  CULTURE, BLOOD (ROUTINE X 2)  URINE CULTURE  PROTIME-INR  APTT  LIPASE, BLOOD  URINALYSIS, ROUTINE W REFLEX MICROSCOPIC  CBG MONITORING, ED  TROPONIN I (HIGH SENSITIVITY)  TROPONIN I (HIGH SENSITIVITY)    EKG EKG Interpretation  Date/Time:  Sunday August 28 2021 19:21:51 EST Ventricular Rate:  68 PR Interval:  163 QRS Duration: 84 QT Interval:  420 QTC Calculation: 447 R Axis:   114 Text Interpretation: Sinus rhythm Biatrial enlargement Right ventricular hypertrophy  similar to Aug 15 2021 Confirmed by Sherwood Gambler [phone]) on 08/28/2021 7:26:38 PM  Radiology CT HEAD WO CONTRAST (5MM)  Result Date: 08/28/2021 CLINICAL DATA:  Seizure, new-onset, no history of trauma. Status post fall EXAM: CT HEAD WITHOUT CONTRAST TECHNIQUE: Contiguous axial images were obtained from the base of the skull through the vertex without intravenous contrast. COMPARISON:  CT head 11/08/2019 BRAIN: BRAIN Patchy and confluent areas of decreased attenuation are noted throughout the deep and periventricular white matter of the cerebral hemispheres bilaterally, compatible with chronic microvascular ischemic disease. No evidence of large-territorial acute infarction. No parenchymal hemorrhage. No mass lesion. No extra-axial collection. No mass effect or midline shift. No hydrocephalus. Cavum septum pellucidum variant. Basilar cisterns are patent. Vascular: No hyperdense vessel.  Skull: No acute fracture or focal lesion. Sinuses/Orbits: Paranasal sinuses and mastoid air cells are clear. Bilateral scleral buckle. Otherwise orbits are unremarkable. Other: None. IMPRESSION: No acute intracranial abnormality. Electronically Signed   By: Iven Finn M.D.   On: 08/28/2021 22:18   CT Angio Chest PE W and/or Wo Contrast  Result Date: 08/28/2021 CLINICAL DATA:  Pulmonary embolism (PE) suspected, high prob; Nausea/vomiting Sepsis EXAM: CT ANGIOGRAPHY CHEST CT ABDOMEN AND PELVIS WITH CONTRAST TECHNIQUE: Multidetector CT imaging of the chest was performed using the standard  protocol during bolus administration of intravenous contrast. Multiplanar CT image reconstructions and MIPs were obtained to evaluate the vascular anatomy. Multidetector CT imaging of the abdomen and pelvis was performed using the standard protocol during bolus administration of intravenous contrast. CONTRAST:  74mL OMNIPAQUE IOHEXOL 350 MG/ML SOLN COMPARISON:  None. FINDINGS: CTA CHEST FINDINGS Cardiovascular: Satisfactory opacification of the pulmonary arteries to the segmental level. No evidence of pulmonary embolism. Enlarged left and right main pulmonary arteries. Enlarged right atrium. No significant pericardial effusion. The thoracic aorta is normal in caliber. No atherosclerotic plaque of the thoracic aorta. No coronary artery calcifications. Mediastinum/Nodes: No pneumomediastinum. No mediastinal hematoma formation. No enlarged mediastinal, hilar, or axillary lymph nodes. Thyroid gland, trachea, and esophagus demonstrate no significant findings. Lungs/Pleura: Peribronchovascular consolidative airspace opacities within the anterior inferior right upper lobe. No pulmonary nodule. No pulmonary mass. No pulmonary contusion or laceration. No pneumatocele formation. No pleural effusion. No pneumothorax. Musculoskeletal: No abdominal wall hernia or abnormality. No suspicious lytic or blastic osseous lesions. No acute  displaced fracture. Multilevel degenerative changes of the spine. Review of the MIP images confirms the above findings. CT ABDOMEN and PELVIS FINDINGS Liver: Not enlarged. No focal lesion. No laceration or subcapsular hematoma. Biliary System: The gallbladder is otherwise unremarkable with no radio-opaque gallstones. No biliary ductal dilatation. Pancreas: Normal pancreatic contour. No main pancreatic duct dilatation. Spleen: Not enlarged. No focal lesion. No laceration, subcapsular hematoma, or vascular injury. Adrenal Glands: No nodularity bilaterally. Kidneys: Bilateral kidneys enhance symmetrically. No hydronephrosis. No contusion, laceration, or subcapsular hematoma. No injury to the vascular structures or collecting systems. No hydroureter. The urinary bladder is unremarkable. On delayed imaging, there is no urothelial wall thickening and there are no filling defects in the opacified portions of the bilateral collecting systems or ureters. Bowel: Under distension of the ascending colon with query bowel thickening. No large bowel dilatation. No small bowel wall thickening or dilatation. The appendix is unremarkable. Mesentery, Omentum, and Peritoneum: No simple free fluid ascites. No pneumoperitoneum. No hemoperitoneum. No mesenteric hematoma identified. No organized fluid collection. Pelvic Organs: The uterus and bilateral adnexal regions are unremarkable. Lymph Nodes: No abdominal, pelvic, inguinal lymphadenopathy. Vasculature: No abdominal aorta or iliac aneurysm. No active contrast extravasation or pseudoaneurysm. Musculoskeletal: No significant soft tissue hematoma. No acute pelvic fracture. No spinal fracture. Severe degenerative changes of the L4-L5 L5-S1 levels with intervertebral disc space vacuum phenomenon. Review of the MIP images confirms the above findings. IMPRESSION: 1. No pulmonary embolus. 2. Right upper lobe aspiration pneumonia. 3. Enlarged left and right main pulmonary arteries suggestive  of pulmonary hypertension. 4. Enlarged right atrium. 5. Under distension of the ascending colon with query mild bowel thickening. Correlate clinically for colitis. 6.  No acute traumatic injury to the chest, abdomen, or pelvis. 7. No acute fracture or traumatic malalignment of the thoracic or lumbar spine. Electronically Signed   By: Iven Finn M.D.   On: 08/28/2021 22:37   CT ABDOMEN PELVIS W CONTRAST  Result Date: 08/28/2021 CLINICAL DATA:  Pulmonary embolism (PE) suspected, high prob; Nausea/vomiting Sepsis EXAM: CT ANGIOGRAPHY CHEST CT ABDOMEN AND PELVIS WITH CONTRAST TECHNIQUE: Multidetector CT imaging of the chest was performed using the standard protocol during bolus administration of intravenous contrast. Multiplanar CT image reconstructions and MIPs were obtained to evaluate the vascular anatomy. Multidetector CT imaging of the abdomen and pelvis was performed using the standard protocol during bolus administration of intravenous contrast. CONTRAST:  4mL OMNIPAQUE IOHEXOL 350 MG/ML SOLN COMPARISON:  None. FINDINGS: CTA CHEST FINDINGS  Cardiovascular: Satisfactory opacification of the pulmonary arteries to the segmental level. No evidence of pulmonary embolism. Enlarged left and right main pulmonary arteries. Enlarged right atrium. No significant pericardial effusion. The thoracic aorta is normal in caliber. No atherosclerotic plaque of the thoracic aorta. No coronary artery calcifications. Mediastinum/Nodes: No pneumomediastinum. No mediastinal hematoma formation. No enlarged mediastinal, hilar, or axillary lymph nodes. Thyroid gland, trachea, and esophagus demonstrate no significant findings. Lungs/Pleura: Peribronchovascular consolidative airspace opacities within the anterior inferior right upper lobe. No pulmonary nodule. No pulmonary mass. No pulmonary contusion or laceration. No pneumatocele formation. No pleural effusion. No pneumothorax. Musculoskeletal: No abdominal wall hernia or  abnormality. No suspicious lytic or blastic osseous lesions. No acute displaced fracture. Multilevel degenerative changes of the spine. Review of the MIP images confirms the above findings. CT ABDOMEN and PELVIS FINDINGS Liver: Not enlarged. No focal lesion. No laceration or subcapsular hematoma. Biliary System: The gallbladder is otherwise unremarkable with no radio-opaque gallstones. No biliary ductal dilatation. Pancreas: Normal pancreatic contour. No main pancreatic duct dilatation. Spleen: Not enlarged. No focal lesion. No laceration, subcapsular hematoma, or vascular injury. Adrenal Glands: No nodularity bilaterally. Kidneys: Bilateral kidneys enhance symmetrically. No hydronephrosis. No contusion, laceration, or subcapsular hematoma. No injury to the vascular structures or collecting systems. No hydroureter. The urinary bladder is unremarkable. On delayed imaging, there is no urothelial wall thickening and there are no filling defects in the opacified portions of the bilateral collecting systems or ureters. Bowel: Under distension of the ascending colon with query bowel thickening. No large bowel dilatation. No small bowel wall thickening or dilatation. The appendix is unremarkable. Mesentery, Omentum, and Peritoneum: No simple free fluid ascites. No pneumoperitoneum. No hemoperitoneum. No mesenteric hematoma identified. No organized fluid collection. Pelvic Organs: The uterus and bilateral adnexal regions are unremarkable. Lymph Nodes: No abdominal, pelvic, inguinal lymphadenopathy. Vasculature: No abdominal aorta or iliac aneurysm. No active contrast extravasation or pseudoaneurysm. Musculoskeletal: No significant soft tissue hematoma. No acute pelvic fracture. No spinal fracture. Severe degenerative changes of the L4-L5 L5-S1 levels with intervertebral disc space vacuum phenomenon. Review of the MIP images confirms the above findings. IMPRESSION: 1. No pulmonary embolus. 2. Right upper lobe aspiration  pneumonia. 3. Enlarged left and right main pulmonary arteries suggestive of pulmonary hypertension. 4. Enlarged right atrium. 5. Under distension of the ascending colon with query mild bowel thickening. Correlate clinically for colitis. 6.  No acute traumatic injury to the chest, abdomen, or pelvis. 7. No acute fracture or traumatic malalignment of the thoracic or lumbar spine. Electronically Signed   By: Iven Finn M.D.   On: 08/28/2021 22:37   DG Chest Port 1 View  Result Date: 08/28/2021 CLINICAL DATA:  Status post seizure. EXAM: PORTABLE CHEST 1 VIEW COMPARISON:  August 15, 2021 FINDINGS: There is stable mild to moderate severity cardiomegaly with stable prominence of the perihilar vessels. Both lungs are clear. The visualized skeletal structures are unremarkable. IMPRESSION: Stable cardiomegaly without acute or active cardiopulmonary disease. Electronically Signed   By: Virgina Norfolk M.D.   On: 08/28/2021 20:17    Procedures .Critical Care Performed by: Sherwood Gambler, MD Authorized by: Sherwood Gambler, MD   Critical care provider statement:    Critical care time (minutes):  35   Critical care time was exclusive of:  Separately billable procedures and treating other patients   Critical care was necessary to treat or prevent imminent or life-threatening deterioration of the following conditions:  Sepsis   Critical care was time spent personally by me on  the following activities:  Development of treatment plan with patient or surrogate, discussions with consultants, evaluation of patient's response to treatment, examination of patient, ordering and review of laboratory studies, ordering and review of radiographic studies, ordering and performing treatments and interventions, pulse oximetry, re-evaluation of patient's condition and review of old charts    Medications Ordered in ED Medications  cefTRIAXone (ROCEPHIN) 2 g in sodium chloride 0.9 % 100 mL IVPB (2 g Intravenous New  Bag/Given 08/28/21 2254)  azithromycin (ZITHROMAX) 500 mg in sodium chloride 0.9 % 250 mL IVPB (500 mg Intravenous New Bag/Given 08/28/21 2256)  lactated ringers bolus 1,000 mL (0 mLs Intravenous Stopped 08/28/21 2012)  lactated ringers bolus 1,000 mL (1,000 mLs Intravenous New Bag/Given 08/28/21 2058)  iohexol (OMNIPAQUE) 350 MG/ML injection 80 mL (80 mLs Intravenous Contrast Given 08/28/21 2144)    ED Course/ Medical Decision Making/ A&P                           Medical Decision Making  History is mostly obtained from 2 cousins at the bedside.  I have interpreted the ECG which shows no obvious emergent condition that could cause syncope.  Labs have been reviewed and she has a new leukocytosis that is concerning given her acute infection.  She has an elevated lactic acid, which in the setting of pneumonia seen on CT is concerning for sepsis.  She had a few low blood pressures prior to any treatment being started but then prior to fluids being started her blood pressure had normalized.  I am not sure if these were truly hypotensive or not.  She has had no further hypotension.  However with her elevated lactate and WBC and a CT showing pneumonia and concern for sepsis and she will need admission.  I have personally reviewed the images, chest x-ray, CT head and CT chest/abdomen/pelvis.  There does appear to be a right-sided pneumonia, agree with radiology.  She otherwise is maintaining her airway and has no respiratory distress.  I suspect her episode today was more syncope/generalized weakness rather than seizure-like activity.  Discussed with Dr. Flossie Buffy for admission.         Final Clinical Impression(s) / ED Diagnoses Final diagnoses:  Sepsis due to pneumonia [facility])  Community acquired pneumonia of right upper lobe of lung    Rx / DC Orders ED Discharge Orders     None         Sherwood Gambler, MD 08/28/21 2309

## 2021-08-29 ENCOUNTER — Inpatient Hospital Stay (HOSPITAL_COMMUNITY)
Admit: 2021-08-29 | Discharge: 2021-08-29 | Disposition: A | Discharge status: Home / Self Care | Payer: Medicaid Other | Attending: Family Medicine | Admitting: Family Medicine

## 2021-08-29 DIAGNOSIS — Z681 Body mass index (BMI) 19 or less, adult: Secondary | ICD-10-CM | POA: Diagnosis not present

## 2021-08-29 DIAGNOSIS — Z20822 Contact with and (suspected) exposure to covid-19: Secondary | ICD-10-CM | POA: Diagnosis present

## 2021-08-29 DIAGNOSIS — J189 Pneumonia, unspecified organism: Secondary | ICD-10-CM | POA: Diagnosis present

## 2021-08-29 DIAGNOSIS — A419 Sepsis, unspecified organism: Secondary | ICD-10-CM | POA: Diagnosis present

## 2021-08-29 DIAGNOSIS — Z23 Encounter for immunization: Secondary | ICD-10-CM | POA: Diagnosis not present

## 2021-08-29 DIAGNOSIS — R6520 Severe sepsis without septic shock: Secondary | ICD-10-CM | POA: Diagnosis present

## 2021-08-29 DIAGNOSIS — Z9181 History of falling: Secondary | ICD-10-CM | POA: Diagnosis not present

## 2021-08-29 DIAGNOSIS — R55 Syncope and collapse: Secondary | ICD-10-CM | POA: Diagnosis present

## 2021-08-29 DIAGNOSIS — Q909 Down syndrome, unspecified: Secondary | ICD-10-CM | POA: Diagnosis not present

## 2021-08-29 DIAGNOSIS — I951 Orthostatic hypotension: Secondary | ICD-10-CM | POA: Diagnosis present

## 2021-08-29 DIAGNOSIS — E538 Deficiency of other specified B group vitamins: Secondary | ICD-10-CM | POA: Diagnosis present

## 2021-08-29 DIAGNOSIS — E872 Acidosis, unspecified: Secondary | ICD-10-CM | POA: Diagnosis present

## 2021-08-29 DIAGNOSIS — E860 Dehydration: Secondary | ICD-10-CM | POA: Diagnosis present

## 2021-08-29 DIAGNOSIS — H548 Legal blindness, as defined in USA: Secondary | ICD-10-CM | POA: Diagnosis present

## 2021-08-29 DIAGNOSIS — I959 Hypotension, unspecified: Secondary | ICD-10-CM | POA: Diagnosis present

## 2021-08-29 DIAGNOSIS — E785 Hyperlipidemia, unspecified: Secondary | ICD-10-CM | POA: Diagnosis present

## 2021-08-29 DIAGNOSIS — I9589 Other hypotension: Secondary | ICD-10-CM | POA: Diagnosis not present

## 2021-08-29 DIAGNOSIS — F79 Unspecified intellectual disabilities: Secondary | ICD-10-CM | POA: Diagnosis present

## 2021-08-29 LAB — CBC
HCT: 36.9 % (ref 36.0–46.0)
Hemoglobin: 12 g/dL (ref 12.0–15.0)
MCH: 36.5 pg — ABNORMAL HIGH (ref 26.0–34.0)
MCHC: 32.5 g/dL (ref 30.0–36.0)
MCV: 112.2 fL — ABNORMAL HIGH (ref 80.0–100.0)
Platelets: 248 10*3/uL (ref 150–400)
RBC: 3.29 MIL/uL — ABNORMAL LOW (ref 3.87–5.11)
RDW: 13.2 % (ref 11.5–15.5)
WBC: 26.1 10*3/uL — ABNORMAL HIGH (ref 4.0–10.5)
nRBC: 0 % (ref 0.0–0.2)

## 2021-08-29 LAB — URINALYSIS, ROUTINE W REFLEX MICROSCOPIC
Bilirubin Urine: NEGATIVE
Glucose, UA: NEGATIVE mg/dL
Hgb urine dipstick: NEGATIVE
Ketones, ur: NEGATIVE mg/dL
Nitrite: NEGATIVE
Protein, ur: NEGATIVE mg/dL
Specific Gravity, Urine: 1.027 (ref 1.005–1.030)
pH: 7 (ref 5.0–8.0)

## 2021-08-29 LAB — PROCALCITONIN: Procalcitonin: 1.09 ng/mL

## 2021-08-29 LAB — BASIC METABOLIC PANEL
Anion gap: 7 (ref 5–15)
BUN: 11 mg/dL (ref 8–23)
CO2: 25 mmol/L (ref 22–32)
Calcium: 8 mg/dL — ABNORMAL LOW (ref 8.9–10.3)
Chloride: 106 mmol/L (ref 98–111)
Creatinine, Ser: 0.89 mg/dL (ref 0.44–1.00)
GFR, Estimated: >60 mL/min (ref >60)
Glucose, Bld: 125 mg/dL — ABNORMAL HIGH (ref 70–99)
Potassium: 3.6 mmol/L (ref 3.5–5.1)
Sodium: 138 mmol/L (ref 135–145)

## 2021-08-29 LAB — STREP PNEUMONIAE URINARY ANTIGEN: Strep Pneumo Urinary Antigen: NEGATIVE

## 2021-08-29 LAB — LACTIC ACID, PLASMA
Lactic Acid, Venous: 3.7 mmol/L (ref 0.5–1.9)
Lactic Acid, Venous: 2 mmol/L (ref 0.5–1.9)

## 2021-08-29 MED ORDER — ENOXAPARIN SODIUM 40 MG/0.4ML IJ SOSY
40.0000 mg | PREFILLED_SYRINGE | INTRAMUSCULAR | Status: DC
  Administered 2021-08-29 – 2021-08-31 (×3): 40 mg via SUBCUTANEOUS
  Filled 2021-08-29 (×3): qty 0.4

## 2021-08-29 MED ORDER — PNEUMOCOCCAL VAC POLYVALENT 25 MCG/0.5ML IJ INJ
0.5000 mL | INJECTION | INTRAMUSCULAR | Status: AC
  Administered 2021-08-30: 0.5 mL via INTRAMUSCULAR
  Filled 2021-08-29: qty 0.5

## 2021-08-29 NOTE — ED Notes (Signed)
Pt assisted up to bedside commode.  Pt voided and had medium sized soft BM.  Pt cooperative, in NAD at this time.  Pt caregiver at beside.

## 2021-08-29 NOTE — Procedures (Signed)
Patient Name: Yvonne Tyler  MRN: [id number]  Epilepsy Attending: Charlsie Quest  Referring Physician/Provider: Dr Benita Gutter Date: 08/29/2021 Duration: 22.10 mins  Patient history: 66yo F with syncope. EEG to evaluate for seizure.  Level of alertness: Awake  AEDs during EEG study: None  Technical aspects: This EEG study was done with scalp electrodes positioned according to the 10-20 International system of electrode placement. Electrical activity was acquired at a sampling rate of 500Hz  and reviewed with a high frequency filter of 70Hz  and a low frequency filter of 1Hz . EEG data were recorded continuously and digitally stored.   Description: The posterior dominant rhythm consists of 8 Hz activity of moderate voltage (25-35 uV) seen predominantly in posterior head regions, symmetric and reactive to eye opening and eye closing. Hyperventilation and photic stimulation were not performed.     IMPRESSION: This study is within normal limits. No seizures or epileptiform discharges were seen throughout the recording.  [name] 

## 2021-08-29 NOTE — Progress Notes (Signed)
EEG complete - results pending 

## 2021-08-29 NOTE — ED Notes (Addendum)
Pt has refuse to eat her lunch because she wants to eat when her caregiver comes back.

## 2021-08-29 NOTE — ED Notes (Signed)
EEG tech at bedside w/ pt

## 2021-08-29 NOTE — Progress Notes (Signed)
PROGRESS NOTE    Yvonne Tyler  ASN:[id number] DOB: 03-29-1956 DOA: 08/28/2021 PCP: Lyndee Hensen, DO   Chief Complaint  Patient presents with   Seizures   Fall  Brief Narrative/Hospital Course: Yvonne Tyler, 66 y.o. female with PMH of blindness and Down syndrome who presents with concerns of seizure-like activity and syncope.  Patient lives with her legal guardian and legal guardian's sister and mother.  Reportedly they heard her fall in the bedroom 1/1, helped her back into her bed and noticed she was foaming in the mouth, shaking and seemed like she was having a seizure, had brief loss of consciousness.  She had a new cough for the past week.  Had episode of nausea and vomiting 1/1. Patient has history of syncope and was seen in the ED several weeks ago.  She was noted to be mildly hypotensive at that time which improved with IV hydration and was discharged home.   In ED- Temp 90 9.39F, initially hypotensive down to 80/40 which improved to 110/58 following 2 L of IV LR bolus.  Heart rate of 60, RR 23.  Labs with leukocytosis,WBC of 15.7, normal hemoglobin lactic acidosis 2.4.Sodium 138, K of 4, creatinine of 1.03, BG of 120 CT head CTA chest abdppen pelvis-right upper quadrant pneumonia. Negative for pulmonary embolism.  Possible colitis. Negative for flu and COVID PCR. Placed on IV antibiotics and admitted  Subjective: Seen and examined this morning Caregiver at the bedside.  Patient sleeping did wake up for me able to tell me her name that she is in the hospital, wanted to get up to use the bedside commode.  Blood pressure cuff adjusted and repeat blood pressure in 105.  Patient having some cough.  Denies any complaint saturating 100% on room air Labs.  Bump in WBC count further, lactic acid down 3.7>2.0  Assessment & Plan:  Severe sepsis POA Right upper lobe pneumonia: Met sepsis curator due to lactic acidosis hypotension leukocytosis source pneumonia.  We will continue  aggressive IV fluid hydration, ceftriaxone azithromycin, follow-up blood culture report.  Lactic acid is downtrending, monitor vital signs.  Check respiratory virus panel, antigen Legionella and strep Recent Labs  Lab 08/28/21 1933 08/28/21 2119 08/29/21 0205 08/29/21 0217 08/29/21 0505  WBC 15.7*  --   --  26.1*  --   LATICACIDVEN 2.3* 2.4* 3.7*  --  2.0*    Possible colitis: In the CT scan symptomatic management antibiotics as above  Syncope/loss of consciousness: Presented with signs and symptoms of sepsis pneumonia, suspect hypotension versus vasovagal related syncope rule out seizure disorder.  EEG pending, monitor in telemetry continue IV fluid hydration.  Monitor orthostatics  Down syndrome Blindness: Lives with legal guardian, at baseline knows her name and birthday, independent and able to do her ADLs.  Continue supportive care.  Low BMI protein calorie malnutrition moderate :Patient's Body mass index is 19.92 kg/m. :  RD eval augment nutrition . She has weight loss of 4.5 kg since February 16 2020 08/28/21 46.3 kg  08/15/21 47.2 kg  02/16/20 50.8 kg    DVT prophylaxis: enoxaparin (LOVENOX) injection 40 mg Start: 08/29/21 1000 Code Status:   Code Status: Partial Code Family Communication: plan of care discussed with patient's caregiver at bedside. Status is: Admitted as observation Remains hospitalized for ongoing management of sepsis and pneumonia with IV fluids IV antibiotics Disposition: Currently not medically stable for discharge. Anticipated Disposition: Home with legal guardian  Objective: Vitals last 24 hrs: Vitals:   08/29/21 0100  08/29/21 0210 08/29/21 0500 08/29/21 0530  BP: (!) 93/54 108/63 (!) 85/63 109/60  Pulse: 63 72 62 70  Resp: 12 (!) $Remo'21 16 14  'numDY$ Temp:      TempSrc:      SpO2: 99% 99% 92% 100%  Weight:      Height:       Weight change:   Intake/Output Summary (Last 24 hours) at 08/29/2021 0719 Last data filed at 08/29/2021 0101 Gross per 24 hour   Intake 2850 ml  Output --  Net 2850 ml   Net IO Since Admission: 2,850 mL [08/29/21 0719]   Physical Examination: General exam: AAox2, on room air, older than stated age. HEENT:Oral mucosa moist, Ear/Nose WNL grossly,dentition normal. Respiratory system: B/l basal crackles, no use of accessory muscle, non tender. Cardiovascular system: S1 & S2 +,No JVD. Gastrointestinal system: Abdomen soft, NT,ND, BS+. Nervous System:Alert, awake, moving extremities. Extremities: edema none, distal peripheral pulses palpable.  Skin: No rashes, no icterus. MSK: Normal muscle bulk, tone, power.  Medications reviewed:  Scheduled Meds:  enoxaparin (LOVENOX) injection  40 mg Subcutaneous Q24H   Continuous Infusions:  sodium chloride 75 mL/hr at 08/28/21 2349   azithromycin Stopped (08/29/21 0101)   cefTRIAXone (ROCEPHIN)  IV Stopped (08/28/21 2343)   Diet Order             Diet regular Room service appropriate? Yes; Fluid consistency: Thin  Diet effective now                   Weight change:   Wt Readings from Last 3 Encounters:  08/28/21 46.3 kg  08/15/21 47.2 kg  02/16/20 50.8 kg     Consultants:see note  Procedures:see note Antimicrobials: Anti-infectives (From admission, onward)    Start     Dose/Rate Route Frequency Ordered Stop   08/28/21 2300  cefTRIAXone (ROCEPHIN) 2 g in sodium chloride 0.9 % 100 mL IVPB        2 g 200 mL/hr over 30 Minutes Intravenous Every 24 hours 08/28/21 2245 09/02/21 2259   08/28/21 2300  azithromycin (ZITHROMAX) 500 mg in sodium chloride 0.9 % 250 mL IVPB        500 mg 250 mL/hr over 60 Minutes Intravenous Every 24 hours 08/28/21 2245 09/02/21 2259      Culture/Microbiology    Component Value Date/Time   SDES ABSCESS LEFT AXILLA 08/12/2007 1940   SPECREQUEST NONE 08/12/2007 1940   CULT  08/12/2007 1940    MODERATE STAPHYLOCOCCUS SPECIES (COAGULASE NEGATIVE) DEMOGRAPHIC UPDATE OCCURRED ON 12/25 AT 1316, QA FLAGS AND RANGES MAY NO LONGER BE  VALID DEMOGRAPHIC UPDATE OCCURRED ON 12/25 AT 1447, QA FLAGS AND RANGES MAY NO LONGER BE VALID   REPTSTATUS  08/12/2007 1940    08/16/2007 FINAL DEMOGRAPHIC UPDATE OCCURRED ON 12/25 AT 1316, QA FLAGS AND RANGES MAY NO LONGER BE VALID DEMOGRAPHIC UPDATE OCCURRED ON 12/25 AT 1447, QA FLAGS AND RANGES MAY NO LONGER BE VALID    Other culture-see note  Unresulted Labs (From admission, onward)     Start     Ordered   08/28/21 1919  Blood Culture (routine x 2)  (Septic presentation on arrival (screening labs, nursing and treatment orders for obvious sepsis))  BLOOD CULTURE X 2,   STAT      08/28/21 1920   08/28/21 1919  Urine Culture  (Septic presentation on arrival (screening labs, nursing and treatment orders for obvious sepsis))  ONCE - STAT,   STAT  Question:  Indication  Answer:  Sepsis   08/28/21 1920          Data Reviewed: I have personally reviewed following labs and imaging studies CBC: Recent Labs  Lab 08/28/21 1933 08/29/21 0217  WBC 15.7* 26.1*  NEUTROABS 14.5*  --   HGB 13.1 12.0  HCT 39.8 36.9  MCV 109.9* 112.2*  PLT 279 248   Basic Metabolic Panel: Recent Labs  Lab 08/28/21 1933 08/29/21 0217  NA 138 138  K 4.0 3.6  CL 106 106  CO2 24 25  GLUCOSE 120* 125*  BUN 13 11  CREATININE 1.03* 0.89  CALCIUM 8.6* 8.0*   GFR: Estimated Creatinine Clearance: 45.3 mL/min (by C-G formula based on SCr of 0.89 mg/dL). Liver Function Tests: Recent Labs  Lab 08/28/21 1933  AST 21  ALT 16  ALKPHOS 43  BILITOT 1.2  PROT 7.2  ALBUMIN 3.5   Recent Labs  Lab 08/28/21 1933  LIPASE 21   No results for input(s): AMMONIA in the last 168 hours. Coagulation Profile: Recent Labs  Lab 08/28/21 1933  INR 1.1   Cardiac Enzymes: No results for input(s): CKTOTAL, CKMB, CKMBINDEX, TROPONINI in the last 168 hours. BNP (last 3 results) No results for input(s): PROBNP in the last 8760 hours. HbA1C: No results for input(s): HGBA1C in the last 72 hours. CBG: Recent  Labs  Lab 08/28/21 1953  GLUCAP 93   Lipid Profile: No results for input(s): CHOL, HDL, LDLCALC, TRIG, CHOLHDL, LDLDIRECT in the last 72 hours. Thyroid Function Tests: No results for input(s): TSH, T4TOTAL, FREET4, T3FREE, THYROIDAB in the last 72 hours. Anemia Panel: No results for input(s): VITAMINB12, FOLATE, FERRITIN, TIBC, IRON, RETICCTPCT in the last 72 hours. Sepsis Labs: Recent Labs  Lab 08/28/21 1933 08/28/21 2119 08/29/21 0205 08/29/21 0505  LATICACIDVEN 2.3* 2.4* 3.7* 2.0*    Recent Results (from the past 240 hour(s))  Resp Panel by RT-PCR (Flu A&B, Covid) Nasopharyngeal Swab     Status: None   Collection Time: 08/28/21  7:19 PM   Specimen: Nasopharyngeal Swab; Nasopharyngeal(NP) swabs in vial transport medium  Result Value Ref Range Status   SARS Coronavirus 2 by RT PCR NEGATIVE NEGATIVE Final    Comment: (NOTE) SARS-CoV-2 target nucleic acids are NOT DETECTED.  The SARS-CoV-2 RNA is generally detectable in upper respiratory specimens during the acute phase of infection. The lowest concentration of SARS-CoV-2 viral copies this assay can detect is 138 copies/mL. A negative result does not preclude SARS-Cov-2 infection and should not be used as the sole basis for treatment or other patient management decisions. A negative result may occur with  improper specimen collection/handling, submission of specimen other than nasopharyngeal swab, presence of viral mutation(s) within the areas targeted by this assay, and inadequate number of viral copies(<138 copies/mL). A negative result must be combined with clinical observations, patient history, and epidemiological information. The expected result is Negative.  Fact Sheet for Patients:  BloggerCourse.com  Fact Sheet for Healthcare Providers:  SeriousBroker.it  This test is no t yet approved or cleared by the [place] FDA and  has been authorized for detection  and/or diagnosis of SARS-CoV-2 by FDA under an Emergency Use Authorization (EUA). This EUA will remain  in effect (meaning this test can be used) for the duration of the COVID-19 declaration under Section 564(b)(1) of the Act, 21 U.S.C.section 360bbb-3(b)(1), unless the authorization is terminated  or revoked sooner.       Influenza A by PCR NEGATIVE NEGATIVE Final  Influenza B by PCR NEGATIVE NEGATIVE Final    Comment: (NOTE) The Xpert Xpress SARS-CoV-2/FLU/RSV plus assay is intended as an aid in the diagnosis of influenza from Nasopharyngeal swab specimens and should not be used as a sole basis for treatment. Nasal washings and aspirates are unacceptable for Xpert Xpress SARS-CoV-2/FLU/RSV testing.  Fact Sheet for Patients: EntrepreneurPulse.com.au  Fact Sheet for Healthcare Providers: IncredibleEmployment.be  This test is not yet approved or cleared by the [place] FDA and has been authorized for detection and/or diagnosis of SARS-CoV-2 by FDA under an Emergency Use Authorization (EUA). This EUA will remain in effect (meaning this test can be used) for the duration of the COVID-19 declaration under Section 564(b)(1) of the Act, 21 U.S.C. section 360bbb-3(b)(1), unless the authorization is terminated or revoked.  Performed at [facility], [place] [address], [place], [place] [address]      Radiology Studies: CT HEAD WO CONTRAST (5MM)  Result Date: 08/28/2021 CLINICAL DATA:  Seizure, new-onset, no history of trauma. Status post fall EXAM: CT HEAD WITHOUT CONTRAST TECHNIQUE: Contiguous axial images were obtained from the base of the skull through the vertex without intravenous contrast. COMPARISON:  CT head 11/08/2019 BRAIN: BRAIN Patchy and confluent areas of decreased attenuation are noted throughout the deep and periventricular white matter of the cerebral hemispheres bilaterally, compatible with chronic microvascular  ischemic disease. No evidence of large-territorial acute infarction. No parenchymal hemorrhage. No mass lesion. No extra-axial collection. No mass effect or midline shift. No hydrocephalus. Cavum septum pellucidum variant. Basilar cisterns are patent. Vascular: No hyperdense vessel. Skull: No acute fracture or focal lesion. Sinuses/Orbits: Paranasal sinuses and mastoid air cells are clear. Bilateral scleral buckle. Otherwise orbits are unremarkable. Other: None. IMPRESSION: No acute intracranial abnormality. Electronically Signed   By: Iven Finn M.D.   On: 08/28/2021 22:18   CT Angio Chest PE W and/or Wo Contrast  Result Date: 08/28/2021 CLINICAL DATA:  Pulmonary embolism (PE) suspected, high prob; Nausea/vomiting Sepsis EXAM: CT ANGIOGRAPHY CHEST CT ABDOMEN AND PELVIS WITH CONTRAST TECHNIQUE: Multidetector CT imaging of the chest was performed using the standard protocol during bolus administration of intravenous contrast. Multiplanar CT image reconstructions and MIPs were obtained to evaluate the vascular anatomy. Multidetector CT imaging of the abdomen and pelvis was performed using the standard protocol during bolus administration of intravenous contrast. CONTRAST:  61mL OMNIPAQUE IOHEXOL 350 MG/ML SOLN COMPARISON:  None. FINDINGS: CTA CHEST FINDINGS Cardiovascular: Satisfactory opacification of the pulmonary arteries to the segmental level. No evidence of pulmonary embolism. Enlarged left and right main pulmonary arteries. Enlarged right atrium. No significant pericardial effusion. The thoracic aorta is normal in caliber. No atherosclerotic plaque of the thoracic aorta. No coronary artery calcifications. Mediastinum/Nodes: No pneumomediastinum. No mediastinal hematoma formation. No enlarged mediastinal, hilar, or axillary lymph nodes. Thyroid gland, trachea, and esophagus demonstrate no significant findings. Lungs/Pleura: Peribronchovascular consolidative airspace opacities within the anterior inferior  right upper lobe. No pulmonary nodule. No pulmonary mass. No pulmonary contusion or laceration. No pneumatocele formation. No pleural effusion. No pneumothorax. Musculoskeletal: No abdominal wall hernia or abnormality. No suspicious lytic or blastic osseous lesions. No acute displaced fracture. Multilevel degenerative changes of the spine. Review of the MIP images confirms the above findings. CT ABDOMEN and PELVIS FINDINGS Liver: Not enlarged. No focal lesion. No laceration or subcapsular hematoma. Biliary System: The gallbladder is otherwise unremarkable with no radio-opaque gallstones. No biliary ductal dilatation. Pancreas: Normal pancreatic contour. No main pancreatic duct dilatation. Spleen: Not enlarged. No focal lesion. No  laceration, subcapsular hematoma, or vascular injury. Adrenal Glands: No nodularity bilaterally. Kidneys: Bilateral kidneys enhance symmetrically. No hydronephrosis. No contusion, laceration, or subcapsular hematoma. No injury to the vascular structures or collecting systems. No hydroureter. The urinary bladder is unremarkable. On delayed imaging, there is no urothelial wall thickening and there are no filling defects in the opacified portions of the bilateral collecting systems or ureters. Bowel: Under distension of the ascending colon with query bowel thickening. No large bowel dilatation. No small bowel wall thickening or dilatation. The appendix is unremarkable. Mesentery, Omentum, and Peritoneum: No simple free fluid ascites. No pneumoperitoneum. No hemoperitoneum. No mesenteric hematoma identified. No organized fluid collection. Pelvic Organs: The uterus and bilateral adnexal regions are unremarkable. Lymph Nodes: No abdominal, pelvic, inguinal lymphadenopathy. Vasculature: No abdominal aorta or iliac aneurysm. No active contrast extravasation or pseudoaneurysm. Musculoskeletal: No significant soft tissue hematoma. No acute pelvic fracture. No spinal fracture. Severe degenerative  changes of the L4-L5 L5-S1 levels with intervertebral disc space vacuum phenomenon. Review of the MIP images confirms the above findings. IMPRESSION: 1. No pulmonary embolus. 2. Right upper lobe aspiration pneumonia. 3. Enlarged left and right main pulmonary arteries suggestive of pulmonary hypertension. 4. Enlarged right atrium. 5. Under distension of the ascending colon with query mild bowel thickening. Correlate clinically for colitis. 6.  No acute traumatic injury to the chest, abdomen, or pelvis. 7. No acute fracture or traumatic malalignment of the thoracic or lumbar spine. Electronically Signed   By: Iven Finn M.D.   On: 08/28/2021 22:37   CT ABDOMEN PELVIS W CONTRAST  Result Date: 08/28/2021 CLINICAL DATA:  Pulmonary embolism (PE) suspected, high prob; Nausea/vomiting Sepsis EXAM: CT ANGIOGRAPHY CHEST CT ABDOMEN AND PELVIS WITH CONTRAST TECHNIQUE: Multidetector CT imaging of the chest was performed using the standard protocol during bolus administration of intravenous contrast. Multiplanar CT image reconstructions and MIPs were obtained to evaluate the vascular anatomy. Multidetector CT imaging of the abdomen and pelvis was performed using the standard protocol during bolus administration of intravenous contrast. CONTRAST:  29mL OMNIPAQUE IOHEXOL 350 MG/ML SOLN COMPARISON:  None. FINDINGS: CTA CHEST FINDINGS Cardiovascular: Satisfactory opacification of the pulmonary arteries to the segmental level. No evidence of pulmonary embolism. Enlarged left and right main pulmonary arteries. Enlarged right atrium. No significant pericardial effusion. The thoracic aorta is normal in caliber. No atherosclerotic plaque of the thoracic aorta. No coronary artery calcifications. Mediastinum/Nodes: No pneumomediastinum. No mediastinal hematoma formation. No enlarged mediastinal, hilar, or axillary lymph nodes. Thyroid gland, trachea, and esophagus demonstrate no significant findings. Lungs/Pleura:  Peribronchovascular consolidative airspace opacities within the anterior inferior right upper lobe. No pulmonary nodule. No pulmonary mass. No pulmonary contusion or laceration. No pneumatocele formation. No pleural effusion. No pneumothorax. Musculoskeletal: No abdominal wall hernia or abnormality. No suspicious lytic or blastic osseous lesions. No acute displaced fracture. Multilevel degenerative changes of the spine. Review of the MIP images confirms the above findings. CT ABDOMEN and PELVIS FINDINGS Liver: Not enlarged. No focal lesion. No laceration or subcapsular hematoma. Biliary System: The gallbladder is otherwise unremarkable with no radio-opaque gallstones. No biliary ductal dilatation. Pancreas: Normal pancreatic contour. No main pancreatic duct dilatation. Spleen: Not enlarged. No focal lesion. No laceration, subcapsular hematoma, or vascular injury. Adrenal Glands: No nodularity bilaterally. Kidneys: Bilateral kidneys enhance symmetrically. No hydronephrosis. No contusion, laceration, or subcapsular hematoma. No injury to the vascular structures or collecting systems. No hydroureter. The urinary bladder is unremarkable. On delayed imaging, there is no urothelial wall thickening and there are no filling defects  in the opacified portions of the bilateral collecting systems or ureters. Bowel: Under distension of the ascending colon with query bowel thickening. No large bowel dilatation. No small bowel wall thickening or dilatation. The appendix is unremarkable. Mesentery, Omentum, and Peritoneum: No simple free fluid ascites. No pneumoperitoneum. No hemoperitoneum. No mesenteric hematoma identified. No organized fluid collection. Pelvic Organs: The uterus and bilateral adnexal regions are unremarkable. Lymph Nodes: No abdominal, pelvic, inguinal lymphadenopathy. Vasculature: No abdominal aorta or iliac aneurysm. No active contrast extravasation or pseudoaneurysm. Musculoskeletal: No significant soft  tissue hematoma. No acute pelvic fracture. No spinal fracture. Severe degenerative changes of the L4-L5 L5-S1 levels with intervertebral disc space vacuum phenomenon. Review of the MIP images confirms the above findings. IMPRESSION: 1. No pulmonary embolus. 2. Right upper lobe aspiration pneumonia. 3. Enlarged left and right main pulmonary arteries suggestive of pulmonary hypertension. 4. Enlarged right atrium. 5. Under distension of the ascending colon with query mild bowel thickening. Correlate clinically for colitis. 6.  No acute traumatic injury to the chest, abdomen, or pelvis. 7. No acute fracture or traumatic malalignment of the thoracic or lumbar spine. Electronically Signed   By: Iven Finn M.D.   On: 08/28/2021 22:37   DG Chest Port 1 View  Result Date: 08/28/2021 CLINICAL DATA:  Status post seizure. EXAM: PORTABLE CHEST 1 VIEW COMPARISON:  August 15, 2021 FINDINGS: There is stable mild to moderate severity cardiomegaly with stable prominence of the perihilar vessels. Both lungs are clear. The visualized skeletal structures are unremarkable. IMPRESSION: Stable cardiomegaly without acute or active cardiopulmonary disease. Electronically Signed   By: Virgina Norfolk M.D.   On: 08/28/2021 20:17     LOS: 0 days   Antonieta Pert, MD [facility]  08/29/2021, 7:19 AM

## 2021-08-29 NOTE — ED Notes (Signed)
Attempted to get urine sample. Patient used bedside commode and bowel movement was contaminated in the urine.

## 2021-08-30 ENCOUNTER — Encounter (HOSPITAL_COMMUNITY): Payer: Self-pay | Admitting: Internal Medicine

## 2021-08-30 LAB — BASIC METABOLIC PANEL
Anion gap: 11 (ref 5–15)
BUN: 9 mg/dL (ref 8–23)
CO2: 25 mmol/L (ref 22–32)
Calcium: 8.2 mg/dL — ABNORMAL LOW (ref 8.9–10.3)
Chloride: 106 mmol/L (ref 98–111)
Creatinine, Ser: 0.84 mg/dL (ref 0.44–1.00)
GFR, Estimated: >60 mL/min (ref >60)
Glucose, Bld: 104 mg/dL — ABNORMAL HIGH (ref 70–99)
Potassium: 3.6 mmol/L (ref 3.5–5.1)
Sodium: 142 mmol/L (ref 135–145)

## 2021-08-30 LAB — LEGIONELLA PNEUMOPHILA SEROGP 1 UR AG: L. pneumophila Serogp 1 Ur Ag: NEGATIVE

## 2021-08-30 LAB — CBC
HCT: 35.7 % — ABNORMAL LOW (ref 36.0–46.0)
Hemoglobin: 11.8 g/dL — ABNORMAL LOW (ref 12.0–15.0)
MCH: 36.2 pg — ABNORMAL HIGH (ref 26.0–34.0)
MCHC: 33.1 g/dL (ref 30.0–36.0)
MCV: 109.5 fL — ABNORMAL HIGH (ref 80.0–100.0)
Platelets: 241 10*3/uL (ref 150–400)
RBC: 3.26 MIL/uL — ABNORMAL LOW (ref 3.87–5.11)
RDW: 13.4 % (ref 11.5–15.5)
WBC: 15.1 10*3/uL — ABNORMAL HIGH (ref 4.0–10.5)
nRBC: 0 % (ref 0.0–0.2)

## 2021-08-30 LAB — PROCALCITONIN: Procalcitonin: 1.12 ng/mL

## 2021-08-30 LAB — URINE CULTURE: Culture: NO GROWTH

## 2021-08-30 MED ORDER — AZITHROMYCIN 250 MG PO TABS
500.0000 mg | ORAL_TABLET | Freq: Every day | ORAL | Status: DC
  Administered 2021-08-30 – 2021-08-31 (×2): 500 mg via ORAL
  Filled 2021-08-30 (×2): qty 2

## 2021-08-30 NOTE — TOC Progression Note (Signed)
Transition of Care [facility]) - Progression Note    Patient Details  Name: Yvonne Tyler MRN: [id number] Date of Birth: 1955/12/13  Transition [facility]) CM/SW Contact  Purcell Mouton, RN Phone Number: 08/30/2021, 1:54 PM  Clinical Narrative:    There are no HH needs noted at present time. Chart reviewed by TOC.    Expected Discharge Plan: Home/Self Care Barriers to Discharge: No Barriers Identified  Expected Discharge Plan and Services Expected Discharge Plan: Home/Self Care       Living arrangements for the past 2 months: Single Family Home                                       Social Determinants of Health (SDOH) Interventions    Readmission Risk Interventions No flowsheet data found.

## 2021-08-30 NOTE — Progress Notes (Signed)
Initial Nutrition Assessment  DOCUMENTATION CODES:   Not applicable  INTERVENTION:  - will order Ensure Enlive po BID, each supplement provides 350 kcal and 20 grams of protein.   NUTRITION DIAGNOSIS:   Increased nutrient needs related to acute illness as evidenced by estimated needs.  GOAL:   Patient will meet greater than or equal to 90% of their needs  MONITOR:   PO intake, Supplement acceptance, Labs, Weight trends  REASON FOR ASSESSMENT:   Consult Assessment of nutrition requirement/status  ASSESSMENT:   66 y.o. 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 She presented to the ED due to seizure-like activity and syncope. She lives with a legal guardian and the legal guardians sister and mother. CT chest showed RUQ PNA.  Patient noted to be a/o to self only. She was sleeping at the time of RD visit with no visitors present at that time. Introduced self to patient and let her know that RD would be feeling face, arms, and legs. Patient did not vocalize at any point but did slightly nod head to acknowledge.  Breakfast tray was still at bedside at time of visit early afternoon; she had consumed 100% of the meal.  Weight on 1/1 was 102 lb and weight on 08/15/21 was 104 lb. This indicates 2 lb weight loss (2% body weight) in 2 weeks; not significant for time frame. Prior to that date, the most recently documented weight was 112 lb on 02/02/20. This indicates 10 lb weight loss (8.9% body weight) in 17 months; not significant for time frame.   Patient noted to meet criteria for severe sepsis on admission.   Labs reviewed; Ca: 8.2 mg/dl. Medications reviewed;. IVF; NS @ 125 ml/hr.    NUTRITION - FOCUSED PHYSICAL EXAM:  Flowsheet Row Most Recent Value  Orbital Region No depletion  Upper Arm Region No depletion  Thoracic and Lumbar Region Unable to assess  Buccal Region No depletion  Temple  Region No depletion  Clavicle Bone Region No depletion  Clavicle and Acromion Bone Region No depletion  Scapular Bone Region No depletion  Dorsal Hand Unable to assess  [bilateral mittens]  Patellar Region Moderate depletion  Anterior Thigh Region Moderate depletion  Posterior Calf Region Mild depletion  Edema (RD Assessment) None  Hair Reviewed  Eyes Unable to assess  Mouth Unable to assess  Skin Reviewed  Nails Unable to assess       Diet Order:   Diet Order             Diet regular Room service appropriate? No; Fluid consistency: Thin  Diet effective now                   EDUCATION NEEDS:   No education needs have been identified at this time  Skin:  Skin Assessment: Reviewed RN Assessment  Last BM:  1/3 (type 6)  Height:   Ht Readings from Last 1 Encounters:  08/28/21 5' (1.524 m)    Weight:   Wt Readings from Last 1 Encounters:  08/28/21 46.3 kg     Estimated Nutritional Needs:  Kcal:  1500-1700 kcal Protein:  75-90 grams Fluid:  >/= 1.7 L/day     Trenton Gammon, MS, RD, LDN Inpatient Clinical Dietitian RD pager # available in [facility]  After hours/weekend pager # available in [facility]

## 2021-08-30 NOTE — Progress Notes (Signed)
PROGRESS NOTE    Yvonne Tyler  MWU:[id number] DOB: 03/22/1956 DOA: 08/28/2021 PCP: Lyndee Hensen, DO   Chief Complaint  Patient presents with   Seizures   Fall  Brief Narrative/Hospital Course: Yvonne Tyler, 66 y.o. female with PMH of blindness and Down syndrome who presents with concerns of seizure-like activity and syncope.  Patient lives with her legal guardian and legal guardian's sister and mother.  Reportedly they heard her fall in the bedroom 1/1, helped her back into her bed and noticed she was foaming in the mouth, shaking and seemed like she was having a seizure, had brief loss of consciousness.  She had a new cough for the past week.  Had episode of nausea and vomiting 1/1. Patient has history of syncope and was seen in the ED several weeks ago.  She was noted to be mildly hypotensive at that time which improved with IV hydration and was discharged home. In ED- Temp 90 9.74F, initially hypotensive down to 80/40 which improved to 110/58 following 2 L of IV LR bolus.  Heart rate of 60, RR 23.  Labs with leukocytosis,WBC of 15.7, normal hemoglobin lactic acidosis 2.4.Sodium 138, K of 4, creatinine of 1.03, BG of 120 CT head CTA chest abdppen pelvis-right upper quadrant pneumonia. Negative for pulmonary embolism.  Under distention of the ascending colon query mild bowel thickening. Negative for flu and COVID PCR.Placed on IV antibiotics and admitted  Subjective: Seen and examined this morning. Mildly agitated She had eaten her breakfast Overnight afebrile, WBC count trending Blood pressure has been stable in 100-120s  Assessment & Plan:  Severe sepsis POA Right upper lobe pneumonia: Met sepsis criteria due to lactic acidosis hypotension leukocytosis , source pneumonia.  Remains hemodynamically stable, leukocytosis downtrending, strep pneumonia antigen, follow-up blood cultures, continue with empiric ceftriaxone azithromycin. Recent Labs  Lab 08/28/21 1933 08/28/21 2119  08/29/21 0205 08/29/21 0217 08/29/21 0505 08/30/21 0329  WBC 15.7*  --   --  26.1*  --  15.1*  LATICACIDVEN 2.3* 2.4* 3.7*  --  2.0*  --   PROCALCITON  --   --   --  1.09  --  1.12    ?  Bowel wall thickening: No abdominal pain or diarrhea.  Syncope/loss of consciousness: Presented with signs and symptoms of sepsis pneumonia, suspect hypotension versus vasovagal related syncope rule out seizure disorder.  EEG no seizure activity, continue adequate hydration, check orthostatic vitals once able.   Down syndrome Blindness: Lives with legal guardian, at baseline knows her name and birthday, independent and able to do her ADLs.  Appears fairly stable, continue supportive care.  Low BMI protein calorie malnutrition moderate :Patient's Body mass index is 19.92 kg/m. :  RD eval requested.She has weight loss of 4.5 kg since February 16 2020 08/28/21 46.3 kg  08/15/21 47.2 kg  02/16/20 50.8 kg    DVT prophylaxis: enoxaparin (LOVENOX) injection 40 mg Start: 08/29/21 1000 Code Status:   Code Status: Partial Code Family Communication: plan of care discussed with patient's caregiver at bedside. Status is: Inpatient Remains hospitalized for ongoing management of sepsis and pneumonia with IV fluids IV antibiotics Disposition: Currently not medically stable for discharge. Anticipated Disposition: Home with legal guardian  Objective: Vitals last 24 hrs: Vitals:   08/29/21 1412 08/29/21 1604 08/29/21 2108 08/30/21 0252  BP:  101/67 102/65 (!) 126/98  Pulse:  66 64 84  Resp:  20 18   Temp: 98.6 F (37 C) 97.9 F (36.6 C) 98 F (36.7  C) 98.6 F (37 C)  TempSrc: Oral Oral Oral Oral  SpO2:  100% 97% 98%  Weight:      Height:       Weight change:   Intake/Output Summary (Last 24 hours) at 08/30/2021 1146 Last data filed at 08/30/2021 0700 Gross per 24 hour  Intake 3260.01 ml  Output --  Net 3260.01 ml   Net IO Since Admission: 6,110.01 mL [08/30/21 1146]   Physical Examination: General  exam: AAOx to self, place,older than stated age, weak appearing. HEENT:Oral mucosa moist, Ear/Nose WNL grossly, dentition normal. Respiratory system: bilaterally clear,no use of accessory muscle Cardiovascular system: S1 & S2 +, No JVD,. Gastrointestinal system: Abdomen soft, NT,ND, BS+ Nervous System:Alert, awake, moving extremities and grossly nonfocal Extremities: no edema, distal peripheral pulses palpable.  Skin: No rashes,no icterus. MSK: Normal muscle bulk,tone, power   Medications reviewed:  Scheduled Meds:  enoxaparin (LOVENOX) injection  40 mg Subcutaneous Q24H   Continuous Infusions:  sodium chloride 125 mL/hr at 08/30/21 0004   azithromycin 500 mg (08/30/21 0045)   cefTRIAXone (ROCEPHIN)  IV 2 g (08/30/21 0004)   Diet Order             Diet regular Room service appropriate? No; Fluid consistency: Thin  Diet effective now                   Weight change:   Wt Readings from Last 3 Encounters:  08/28/21 46.3 kg  08/15/21 47.2 kg  02/16/20 50.8 kg     Consultants:see note  Procedures:see note Antimicrobials: Anti-infectives (From admission, onward)    Start     Dose/Rate Route Frequency Ordered Stop   08/28/21 2300  cefTRIAXone (ROCEPHIN) 2 g in sodium chloride 0.9 % 100 mL IVPB        2 g 200 mL/hr over 30 Minutes Intravenous Every 24 hours 08/28/21 2245 09/02/21 2259   08/28/21 2300  azithromycin (ZITHROMAX) 500 mg in sodium chloride 0.9 % 250 mL IVPB        500 mg 250 mL/hr over 60 Minutes Intravenous Every 24 hours 08/28/21 2245 09/02/21 2259      Culture/Microbiology    Component Value Date/Time   SDES  08/28/2021 1924    BLOOD LEFT ANTECUBITAL Performed at [facility], [place] [address], [place], [place] [address]    [place]  08/28/2021 1924    BOTTLES DRAWN AEROBIC AND ANAEROBIC Blood Culture adequate volume Performed at [facility] [address], [place], [place] [address]    CULT  08/28/2021  1924    NO GROWTH 1 DAY Performed at [facility] [address], [place],  [address]    REPTSTATUS PENDING 08/28/2021 1924    Other culture-see note  Unresulted Labs (From admission, onward)     Start     Ordered   08/30/21 8757  Basic metabolic panel  Daily,   R     Question:  Specimen collection method  Answer:  Lab=Lab collect   08/29/21 0729   08/30/21 0500  CBC  Daily,   R     Question:  Specimen collection method  Answer:  Lab=Lab collect   08/29/21 0729   08/30/21 0500  Procalcitonin  Daily,   R      08/29/21 0748   08/29/21 0730  Legionella Pneumophila Serogp 1 Ur Ag  Once,   R        08/29/21 0729   08/29/21 0729  Respiratory (~20  pathogens) panel by PCR  (Respiratory panel by PCR (~20 pathogens, ~24 hr TAT)  w precautions)  ONCE - STAT,   STAT        08/29/21 0729          Data Reviewed: I have personally reviewed following labs and imaging studies CBC: Recent Labs  Lab 08/28/21 1933 08/29/21 0217 08/30/21 0329  WBC 15.7* 26.1* 15.1*  NEUTROABS 14.5*  --   --   HGB 13.1 12.0 11.8*  HCT 39.8 36.9 35.7*  MCV 109.9* 112.2* 109.5*  PLT 279 248 680   Basic Metabolic Panel: Recent Labs  Lab 08/28/21 1933 08/29/21 0217 08/30/21 0329  NA 138 138 142  K 4.0 3.6 3.6  CL 106 106 106  CO2 _0 GLUCOSE 120* 125* 104*  BUN _1 CREATININE 1.03* 0.89 0.84  CALCIUM 8.6* 8.0* 8.2*   GFR: Estimated Creatinine Clearance: 48 mL/min (by C-G formula based on SCr of 0.84 mg/dL). Liver Function Tests: Recent Labs  Lab 08/28/21 1933  AST 21  ALT 16  ALKPHOS 43  BILITOT 1.2  PROT 7.2  ALBUMIN 3.5   Recent Labs  Lab 08/28/21 1933  LIPASE 21   No results for input(s): AMMONIA in the last 168 hours. Coagulation Profile: Recent Labs  Lab 08/28/21 1933  INR 1.1   Cardiac Enzymes: No results for input(s): CKTOTAL, CKMB, CKMBINDEX, TROPONINI in the last 168 hours. BNP (last 3 results) No results for input(s): PROBNP in the last  8760 hours. HbA1C: No results for input(s): HGBA1C in the last 72 hours. CBG: Recent Labs  Lab 08/28/21 1953  GLUCAP 93   Lipid Profile: No results for input(s): CHOL, HDL, LDLCALC, TRIG, CHOLHDL, LDLDIRECT in the last 72 hours. Thyroid Function Tests: No results for input(s): TSH, T4TOTAL, FREET4, T3FREE, THYROIDAB in the last 72 hours. Anemia Panel: No results for input(s): VITAMINB12, FOLATE, FERRITIN, TIBC, IRON, RETICCTPCT in the last 72 hours. Sepsis Labs: Recent Labs  Lab 08/28/21 1933 08/28/21 2119 08/29/21 0205 08/29/21 0217 08/29/21 0505 08/30/21 0329  PROCALCITON  --   --   --  1.09  --  1.12  LATICACIDVEN 2.3* 2.4* 3.7*  --  2.0*  --     Recent Results (from the past 240 hour(s))  Resp Panel by RT-PCR (Flu A&B, Covid) Nasopharyngeal Swab     Status: None   Collection Time: 08/28/21  7:19 PM   Specimen: Nasopharyngeal Swab; Nasopharyngeal(NP) swabs in vial transport medium  Result Value Ref Range Status   SARS Coronavirus 2 by RT PCR NEGATIVE NEGATIVE Final    Comment: (NOTE) SARS-CoV-2 target nucleic acids are NOT DETECTED.  The SARS-CoV-2 RNA is generally detectable in upper respiratory specimens during the acute phase of infection. The lowest concentration of SARS-CoV-2 viral copies this assay can detect is 138 copies/mL. A negative result does not preclude SARS-Cov-2 infection and should not be used as the sole basis for treatment or other patient management decisions. A negative result may occur with  improper specimen collection/handling, submission of specimen other than nasopharyngeal swab, presence of viral mutation(s) within the areas targeted by this assay, and inadequate number of viral copies(<138 copies/mL). A negative result must be combined with clinical observations, patient history, and epidemiological information. The expected result is Negative.  Fact Sheet for Patients:  EntrepreneurPulse.com.au  Fact Sheet for  Healthcare Providers:  IncredibleEmployment.be  This test is no t yet approved or cleared by the [place] FDA and  has  been authorized for detection and/or diagnosis of SARS-CoV-2 by FDA under an Emergency Use Authorization (EUA). This EUA will remain  in effect (meaning this test can be used) for the duration of the COVID-19 declaration under Section 564(b)(1) of the Act, 21 U.S.C.section 360bbb-3(b)(1), unless the authorization is terminated  or revoked sooner.       Influenza A by PCR NEGATIVE NEGATIVE Final   Influenza B by PCR NEGATIVE NEGATIVE Final    Comment: (NOTE) The Xpert Xpress SARS-CoV-2/FLU/RSV plus assay is intended as an aid in the diagnosis of influenza from Nasopharyngeal swab specimens and should not be used as a sole basis for treatment. Nasal washings and aspirates are unacceptable for Xpert Xpress SARS-CoV-2/FLU/RSV testing.  Fact Sheet for Patients: EntrepreneurPulse.com.au  Fact Sheet for Healthcare Providers: IncredibleEmployment.be  This test is not yet approved or cleared by the [place] FDA and has been authorized for detection and/or diagnosis of SARS-CoV-2 by FDA under an Emergency Use Authorization (EUA). This EUA will remain in effect (meaning this test can be used) for the duration of the COVID-19 declaration under Section 564(b)(1) of the Act, 21 U.S.C. section 360bbb-3(b)(1), unless the authorization is terminated or revoked.  Performed at [facility], [place] [address] [place], [place] [address]   Blood Culture (routine x 2)     Status: None (Preliminary result)   Collection Time: 08/28/21  7:19 PM   Specimen: BLOOD  Result Value Ref Range Status   Specimen Description   Final    BLOOD RIGHT ANTECUBITAL Performed at [facility] [address] [place], [place] [address]    Special Requests   Final    BOTTLES DRAWN AEROBIC AND  ANAEROBIC Blood Culture adequate volume Performed at [facility] [address] [place], [place] [address]    Culture   Final    NO GROWTH 1 DAY Performed at [facility], [place] [address], [place], [place] [address]    Report Status PENDING  Incomplete  Urine Culture     Status: None   Collection Time: 08/28/21  7:19 PM   Specimen: In/Out Cath Urine  Result Value Ref Range Status   Specimen Description   Final    IN/OUT CATH URINE Performed at [facility] [address], [place], [place] [address]    Special Requests   Final    NONE Performed at [facility], [place] [address] [place], [place] [address]    Culture   Final    NO GROWTH Performed at [facility], [place] [address] [place], [place] [address]    Report Status 08/30/2021 FINAL  Final  Blood Culture (routine x 2)     Status: None (Preliminary result)   Collection Time: 08/28/21  7:24 PM   Specimen: BLOOD  Result Value Ref Range Status   Specimen Description   Final    BLOOD LEFT ANTECUBITAL Performed at [facility] [address], [place], [place] [address]    Special Requests   Final    BOTTLES DRAWN AEROBIC AND ANAEROBIC Blood Culture adequate volume Performed at [facility] [address] [place], [place] [address]    Culture   Final    NO GROWTH 1 DAY Performed at [facility], [place] [address] [place],  [address]    Report Status PENDING  Incomplete     Radiology Studies: CT HEAD WO CONTRAST (5MM)  Result Date:  08/28/2021 CLINICAL DATA:  Seizure, new-onset, no history of trauma. Status post fall EXAM: CT HEAD WITHOUT CONTRAST TECHNIQUE: Contiguous axial images were obtained from the base of the skull through the vertex without intravenous contrast. COMPARISON:  CT head 11/08/2019 BRAIN: BRAIN Patchy and confluent areas of decreased attenuation are noted throughout the deep and  periventricular white matter of the cerebral hemispheres bilaterally, compatible with chronic microvascular ischemic disease. No evidence of large-territorial acute infarction. No parenchymal hemorrhage. No mass lesion. No extra-axial collection. No mass effect or midline shift. No hydrocephalus. Cavum septum pellucidum variant. Basilar cisterns are patent. Vascular: No hyperdense vessel. Skull: No acute fracture or focal lesion. Sinuses/Orbits: Paranasal sinuses and mastoid air cells are clear. Bilateral scleral buckle. Otherwise orbits are unremarkable. Other: None. IMPRESSION: No acute intracranial abnormality. Electronically Signed   By: Iven Finn M.D.   On: 08/28/2021 22:18   CT Angio Chest PE W and/or Wo Contrast  Result Date: 08/28/2021 CLINICAL DATA:  Pulmonary embolism (PE) suspected, high prob; Nausea/vomiting Sepsis EXAM: CT ANGIOGRAPHY CHEST CT ABDOMEN AND PELVIS WITH CONTRAST TECHNIQUE: Multidetector CT imaging of the chest was performed using the standard protocol during bolus administration of intravenous contrast. Multiplanar CT image reconstructions and MIPs were obtained to evaluate the vascular anatomy. Multidetector CT imaging of the abdomen and pelvis was performed using the standard protocol during bolus administration of intravenous contrast. CONTRAST:  19m OMNIPAQUE IOHEXOL 350 MG/ML SOLN COMPARISON:  None. FINDINGS: CTA CHEST FINDINGS Cardiovascular: Satisfactory opacification of the pulmonary arteries to the segmental level. No evidence of pulmonary embolism. Enlarged left and right main pulmonary arteries. Enlarged right atrium. No significant pericardial effusion. The thoracic aorta is normal in caliber. No atherosclerotic plaque of the thoracic aorta. No coronary artery calcifications. Mediastinum/Nodes: No pneumomediastinum. No mediastinal hematoma formation. No enlarged mediastinal, hilar, or axillary lymph nodes. Thyroid gland, trachea, and esophagus demonstrate no  significant findings. Lungs/Pleura: Peribronchovascular consolidative airspace opacities within the anterior inferior right upper lobe. No pulmonary nodule. No pulmonary mass. No pulmonary contusion or laceration. No pneumatocele formation. No pleural effusion. No pneumothorax. Musculoskeletal: No abdominal wall hernia or abnormality. No suspicious lytic or blastic osseous lesions. No acute displaced fracture. Multilevel degenerative changes of the spine. Review of the MIP images confirms the above findings. CT ABDOMEN and PELVIS FINDINGS Liver: Not enlarged. No focal lesion. No laceration or subcapsular hematoma. Biliary System: The gallbladder is otherwise unremarkable with no radio-opaque gallstones. No biliary ductal dilatation. Pancreas: Normal pancreatic contour. No main pancreatic duct dilatation. Spleen: Not enlarged. No focal lesion. No laceration, subcapsular hematoma, or vascular injury. Adrenal Glands: No nodularity bilaterally. Kidneys: Bilateral kidneys enhance symmetrically. No hydronephrosis. No contusion, laceration, or subcapsular hematoma. No injury to the vascular structures or collecting systems. No hydroureter. The urinary bladder is unremarkable. On delayed imaging, there is no urothelial wall thickening and there are no filling defects in the opacified portions of the bilateral collecting systems or ureters. Bowel: Under distension of the ascending colon with query bowel thickening. No large bowel dilatation. No small bowel wall thickening or dilatation. The appendix is unremarkable. Mesentery, Omentum, and Peritoneum: No simple free fluid ascites. No pneumoperitoneum. No hemoperitoneum. No mesenteric hematoma identified. No organized fluid collection. Pelvic Organs: The uterus and bilateral adnexal regions are unremarkable. Lymph Nodes: No abdominal, pelvic, inguinal lymphadenopathy. Vasculature: No abdominal aorta or iliac aneurysm. No active contrast extravasation or pseudoaneurysm.  Musculoskeletal: No significant soft tissue hematoma. No acute pelvic fracture. No spinal fracture. Severe degenerative changes of the L4-L5 L5-S1  levels with intervertebral disc space vacuum phenomenon. Review of the MIP images confirms the above findings. IMPRESSION: 1. No pulmonary embolus. 2. Right upper lobe aspiration pneumonia. 3. Enlarged left and right main pulmonary arteries suggestive of pulmonary hypertension. 4. Enlarged right atrium. 5. Under distension of the ascending colon with query mild bowel thickening. Correlate clinically for colitis. 6.  No acute traumatic injury to the chest, abdomen, or pelvis. 7. No acute fracture or traumatic malalignment of the thoracic or lumbar spine. Electronically Signed   By: Iven Finn M.D.   On: 08/28/2021 22:37   CT ABDOMEN PELVIS W CONTRAST  Result Date: 08/28/2021 CLINICAL DATA:  Pulmonary embolism (PE) suspected, high prob; Nausea/vomiting Sepsis EXAM: CT ANGIOGRAPHY CHEST CT ABDOMEN AND PELVIS WITH CONTRAST TECHNIQUE: Multidetector CT imaging of the chest was performed using the standard protocol during bolus administration of intravenous contrast. Multiplanar CT image reconstructions and MIPs were obtained to evaluate the vascular anatomy. Multidetector CT imaging of the abdomen and pelvis was performed using the standard protocol during bolus administration of intravenous contrast. CONTRAST:  41m OMNIPAQUE IOHEXOL 350 MG/ML SOLN COMPARISON:  None. FINDINGS: CTA CHEST FINDINGS Cardiovascular: Satisfactory opacification of the pulmonary arteries to the segmental level. No evidence of pulmonary embolism. Enlarged left and right main pulmonary arteries. Enlarged right atrium. No significant pericardial effusion. The thoracic aorta is normal in caliber. No atherosclerotic plaque of the thoracic aorta. No coronary artery calcifications. Mediastinum/Nodes: No pneumomediastinum. No mediastinal hematoma formation. No enlarged mediastinal, hilar, or axillary  lymph nodes. Thyroid gland, trachea, and esophagus demonstrate no significant findings. Lungs/Pleura: Peribronchovascular consolidative airspace opacities within the anterior inferior right upper lobe. No pulmonary nodule. No pulmonary mass. No pulmonary contusion or laceration. No pneumatocele formation. No pleural effusion. No pneumothorax. Musculoskeletal: No abdominal wall hernia or abnormality. No suspicious lytic or blastic osseous lesions. No acute displaced fracture. Multilevel degenerative changes of the spine. Review of the MIP images confirms the above findings. CT ABDOMEN and PELVIS FINDINGS Liver: Not enlarged. No focal lesion. No laceration or subcapsular hematoma. Biliary System: The gallbladder is otherwise unremarkable with no radio-opaque gallstones. No biliary ductal dilatation. Pancreas: Normal pancreatic contour. No main pancreatic duct dilatation. Spleen: Not enlarged. No focal lesion. No laceration, subcapsular hematoma, or vascular injury. Adrenal Glands: No nodularity bilaterally. Kidneys: Bilateral kidneys enhance symmetrically. No hydronephrosis. No contusion, laceration, or subcapsular hematoma. No injury to the vascular structures or collecting systems. No hydroureter. The urinary bladder is unremarkable. On delayed imaging, there is no urothelial wall thickening and there are no filling defects in the opacified portions of the bilateral collecting systems or ureters. Bowel: Under distension of the ascending colon with query bowel thickening. No large bowel dilatation. No small bowel wall thickening or dilatation. The appendix is unremarkable. Mesentery, Omentum, and Peritoneum: No simple free fluid ascites. No pneumoperitoneum. No hemoperitoneum. No mesenteric hematoma identified. No organized fluid collection. Pelvic Organs: The uterus and bilateral adnexal regions are unremarkable. Lymph Nodes: No abdominal, pelvic, inguinal lymphadenopathy. Vasculature: No abdominal aorta or iliac  aneurysm. No active contrast extravasation or pseudoaneurysm. Musculoskeletal: No significant soft tissue hematoma. No acute pelvic fracture. No spinal fracture. Severe degenerative changes of the L4-L5 L5-S1 levels with intervertebral disc space vacuum phenomenon. Review of the MIP images confirms the above findings. IMPRESSION: 1. No pulmonary embolus. 2. Right upper lobe aspiration pneumonia. 3. Enlarged left and right main pulmonary arteries suggestive of pulmonary hypertension. 4. Enlarged right atrium. 5. Under distension of the ascending colon with query mild bowel thickening.  Correlate clinically for colitis. 6.  No acute traumatic injury to the chest, abdomen, or pelvis. 7. No acute fracture or traumatic malalignment of the thoracic or lumbar spine. Electronically Signed   By: Iven Finn M.D.   On: 08/28/2021 22:37   DG Chest Port 1 View  Result Date: 08/28/2021 CLINICAL DATA:  Status post seizure. EXAM: PORTABLE CHEST 1 VIEW COMPARISON:  August 15, 2021 FINDINGS: There is stable mild to moderate severity cardiomegaly with stable prominence of the perihilar vessels. Both lungs are clear. The visualized skeletal structures are unremarkable. IMPRESSION: Stable cardiomegaly without acute or active cardiopulmonary disease. Electronically Signed   By: Virgina Norfolk M.D.   On: 08/28/2021 20:17   EEG adult  Result Date: 08/29/2021 Lora Havens, MD     08/29/2021  3:02 PM Patient Name: Yvonne Tyler MRN: [id number] Epilepsy Attending: Lora Havens Referring Physician/Provider: Dr Ileene Musa Date: 08/29/2021 Duration: 22.10 mins Patient history: 66yo F with syncope. EEG to evaluate for seizure. Level of alertness: Awake AEDs during EEG study: None Technical aspects: This EEG study was done with scalp electrodes positioned according to the 10-20 International system of electrode placement. Electrical activity was acquired at a sampling rate of _0  and reviewed with a high frequency filter of  _1  and a low frequency filter of _2 . EEG data were recorded continuously and digitally stored. Description: The posterior dominant rhythm consists of 8 Hz activity of moderate voltage (25-35 uV) seen predominantly in posterior head regions, symmetric and reactive to eye opening and eye closing. Hyperventilation and photic stimulation were not performed.   IMPRESSION: This study is within normal limits. No seizures or epileptiform discharges were seen throughout the recording. Lora Havens     LOS: 1 day   Antonieta Pert, MD [facility]  08/30/2021, 11:46 AM

## 2021-08-30 NOTE — Progress Notes (Signed)
PHARMACIST - PHYSICIAN COMMUNICATION DR:   Jonathon Bellows CONCERNING: Antibiotic IV to Oral Route Change Policy  RECOMMENDATION: This patient is receiving Azithromycin by the intravenous route.  Based on criteria approved by the Pharmacy and Therapeutics Committee, the antibiotic(s) is/are being converted to the equivalent oral dose form(s).   DESCRIPTION: These criteria include: Patient being treated for a respiratory tract infection, urinary tract infection, cellulitis or clostridium difficile associated diarrhea if on metronidazole The patient is not neutropenic and does not exhibit a GI malabsorption state The patient is eating (either orally or via tube) and/or has been taking other orally administered medications for a least 24 hours The patient is improving clinically and has a Tmax < 100.5  Otho Bellows PharmD [facility] [phone] 08/30/2021, 12:56 PM   If you have questions about this conversion, please contact the Pharmacy Department  []   [phone] )  [phone] []   [phone] )  [facility] []   [phone] )  [facility] []   [phone] )  [facility] []   [phone] )  [facility]

## 2021-08-31 DIAGNOSIS — I951 Orthostatic hypotension: Secondary | ICD-10-CM

## 2021-08-31 LAB — CBC
HCT: 34 % — ABNORMAL LOW (ref 36.0–46.0)
Hemoglobin: 11.3 g/dL — ABNORMAL LOW (ref 12.0–15.0)
MCH: 36.3 pg — ABNORMAL HIGH (ref 26.0–34.0)
MCHC: 33.2 g/dL (ref 30.0–36.0)
MCV: 109.3 fL — ABNORMAL HIGH (ref 80.0–100.0)
Platelets: 208 10*3/uL (ref 150–400)
RBC: 3.11 MIL/uL — ABNORMAL LOW (ref 3.87–5.11)
RDW: 13.2 % (ref 11.5–15.5)
WBC: 9.9 10*3/uL (ref 4.0–10.5)
nRBC: 0 % (ref 0.0–0.2)

## 2021-08-31 LAB — PROCALCITONIN: Procalcitonin: 0.63 ng/mL

## 2021-08-31 LAB — BASIC METABOLIC PANEL
Anion gap: 10 (ref 5–15)
BUN: 8 mg/dL (ref 8–23)
CO2: 22 mmol/L (ref 22–32)
Calcium: 8.1 mg/dL — ABNORMAL LOW (ref 8.9–10.3)
Chloride: 105 mmol/L (ref 98–111)
Creatinine, Ser: 0.83 mg/dL (ref 0.44–1.00)
GFR, Estimated: >60 mL/min (ref >60)
Glucose, Bld: 108 mg/dL — ABNORMAL HIGH (ref 70–99)
Potassium: 3.5 mmol/L (ref 3.5–5.1)
Sodium: 137 mmol/L (ref 135–145)

## 2021-08-31 MED ORDER — CEFDINIR 300 MG PO CAPS: 300.0000 mg | ORAL_CAPSULE | Freq: Two times a day (BID) | ORAL | 0 refills | Status: AC

## 2021-08-31 MED ORDER — AZITHROMYCIN 500 MG PO TABS: 500.0000 mg | ORAL_TABLET | Freq: Every day | ORAL | 0 refills | Status: AC

## 2021-08-31 NOTE — Progress Notes (Signed)
Chaplain engaged in an initial visit with Yvonne Tyler's caregiver/cousin.  Chaplain recognized that Yvonne Tyler is not in a place to be able to sign Healthcare POA documents.  Caregiver shared that Yvonne Tyler may nod her head yes but that she may not really understand what is being stated.  Chaplain asked if caregiver is Yvonne Tyler's next of kin and she stated, "yes."  Chaplain offered assurance that because she is already providing care and is family, medical staff will reach out to her for decisions.  She is listed on the chart.  Caregiver did share that there is nothing legally in place.  Chaplain voiced that as far as healthcare decisions, her being next of kin and being her primary caregiver will suffice.  Chaplain could assess that they possibly need some other legal documents in place to care for Yvonne Tyler.  If those become available there will still be no need to do an Scientist, water quality, Healthcare POA.   Chaplain provided listening and support.    08/31/21 1000  Clinical Encounter Type  Visited With Patient and family together  Visit Type Initial;Social support  Referral From Family;Nurse;Chaplain  Consult/Referral To [employer]

## 2021-08-31 NOTE — Discharge Summary (Signed)
Physician Discharge Summary  Yvonne Tyler:[id number] DOB: 02/16/1956 DOA: 08/28/2021  PCP: Katha Cabal, DO  Admit date: 08/28/2021 Discharge date: 08/31/2021  Admitted From: Home Disposition: Home  Recommendations for Outpatient Follow-up:  Follow up with PCP in 1-2 weeks Discharging on azithromycin and cefdinir to complete antibiotic course for community-acquired pneumonia  Home Health: No Equipment/Devices: None  Discharge Condition: Stable CODE STATUS: Full code Diet recommendation: Regular diet  History of present illness:  Yvonne Tyler is a 66 year old female with past medical history significant for blindness, Down syndrome, history of syncopal episodes who presented to [facility] ED on 1/1 for concerns of seizure-like activity and syncope.  Patient currently lives with her legal guardian and legal guardian sister and mother.  Reportedly they heard her fall in the bedroom and helped her get back to the bed and noted she was "foaming at the mouth", shaking and seemed like she was having a seizure with a brief loss of consciousness.  She also had a new cough for the past week and 1 episode of nausea/vomiting.  Patient with history of syncopal episodes, seen in the ED several weeks ago and was noted be mildly hypertensive in which improved significantly with IV fluid hydration in which she was discharged home.  Her guardian reports that she does not drink much during the day unless she is guided to.  In the ED, temperature 99.3 F, BP 80/40 with improvement to 110/58 following 2 L of IV fluid hydration.  HR 60, RR 23.  WBC 15.7, lactic acid 2.4, sodium 138, potassium 4, creatinine 1.03, glucose 120.  CT head without contrast with no acute intracranial abnormality.  CT angiogram chest negative for PE.  Patient was started empiric antibiotics and given IV fluid hydration.  Hospital service consulted for further evaluation management of sepsis secondary to community-acquired  pneumonia.   Hospital course:  Sepsis, POA Community-acquired pneumonia Patient presenting to ED following fall at home with findings on imaging concerning for right upper lobe pneumonia.  Patient met sepsis criteria with elevated lactic acid, hypotension, leukocytosis.  Due to concern for seizure-like activity, EEG was performed and was unrevealing.  Patient was started on empiric antibiotics with azithromycin and ceftriaxone and will continue azithromycin to complete 5-day course on discharge with cefdinir 3 mg p.o. twice daily to complete 7-day course.  Patient is not requiring oxygen and leukocytosis has resolved.  Questionable bowel wall thickening CT abdomen/pelvis with under distention of the ascending colon with questionable mild bowel thickening.  No abdominal pain or diarrhea.  Outpatient follow-up PCP.  Orthostatic hypotension Patient presenting with syncope, likely secondary to dehydration.  Patient was noted be hypertensive on admission that responded well to IV fluid hydration.  Repeat orthostatic vital signs negative.  Discussed with guardian, needs further encouragement on increased oral intake.  Down syndrome Blindness Lives with her legal guardian, baseline knows her name and birthday.  Patient is independent and able to do her ADLs alone.  Stable continue supportive care.  Moderate protein calorie malnutrition Body mass index is 19.92 kg/m. Nutrition Status: Nutrition Problem: Increased nutrient needs Etiology: acute illness Signs/Symptoms: estimated needs Interventions: Ensure Enlive (each supplement provides 350kcal and 20 grams of protein) Patient was evaluated by dietitian while inpatient.  Continue to encourage increased oral intake.    Discharge Diagnoses:  Principal Problem:   Community acquired pneumonia Active Problems:   BLINDNESS, LEGAL, [place] DEFINITION   Down syndrome    Discharge Instructions  Discharge Instructions  Diet - low sodium  heart healthy   Complete by: As directed    Increase activity slowly   Complete by: As directed       Allergies as of 08/31/2021   No Known Allergies      Medication List     STOP taking these medications    acetaminophen 325 MG tablet Commonly known as: Tylenol   Vitamin B 12 500 MCG Tabs       TAKE these medications    azithromycin 500 MG tablet Commonly known as: Zithromax Take 1 tablet (500 mg total) by mouth daily for 2 days. Take 1 tablet daily for 3 days. Start taking on: September 01, 2021   cefdinir 300 MG capsule Commonly known as: OMNICEF Take 1 capsule (300 mg total) by mouth 2 (two) times daily for 5 days.   EUCERIN ADVANCED REPAIR EX Apply 1 application topically as needed (Rash on back).   Zarbees Cough+Immune Syrp Take 1 Dose/kg by mouth as needed (cough).        Follow-up Information     Lyndee Hensen, DO. Schedule an appointment as soon as possible for a visit in 1 week(s).   Specialty: Family Medicine Contact information: [address] [address] [phone]                No Known Allergies  Consultations: None   Procedures/Studies: CT HEAD WO CONTRAST (5MM)  Result Date: 08/28/2021 CLINICAL DATA:  Seizure, new-onset, no history of trauma. Status post fall EXAM: CT HEAD WITHOUT CONTRAST TECHNIQUE: Contiguous axial images were obtained from the base of the skull through the vertex without intravenous contrast. COMPARISON:  CT head 11/08/2019 BRAIN: BRAIN Patchy and confluent areas of decreased attenuation are noted throughout the deep and periventricular white matter of the cerebral hemispheres bilaterally, compatible with chronic microvascular ischemic disease. No evidence of large-territorial acute infarction. No parenchymal hemorrhage. No mass lesion. No extra-axial collection. No mass effect or midline shift. No hydrocephalus. Cavum septum pellucidum variant. Basilar cisterns are patent. Vascular: No hyperdense  vessel. Skull: No acute fracture or focal lesion. Sinuses/Orbits: Paranasal sinuses and mastoid air cells are clear. Bilateral scleral buckle. Otherwise orbits are unremarkable. Other: None. IMPRESSION: No acute intracranial abnormality. Electronically Signed   By: Iven Finn M.D.   On: 08/28/2021 22:18   CT Angio Chest PE W and/or Wo Contrast  Result Date: 08/28/2021 CLINICAL DATA:  Pulmonary embolism (PE) suspected, high prob; Nausea/vomiting Sepsis EXAM: CT ANGIOGRAPHY CHEST CT ABDOMEN AND PELVIS WITH CONTRAST TECHNIQUE: Multidetector CT imaging of the chest was performed using the standard protocol during bolus administration of intravenous contrast. Multiplanar CT image reconstructions and MIPs were obtained to evaluate the vascular anatomy. Multidetector CT imaging of the abdomen and pelvis was performed using the standard protocol during bolus administration of intravenous contrast. CONTRAST:  41mL OMNIPAQUE IOHEXOL 350 MG/ML SOLN COMPARISON:  None. FINDINGS: CTA CHEST FINDINGS Cardiovascular: Satisfactory opacification of the pulmonary arteries to the segmental level. No evidence of pulmonary embolism. Enlarged left and right main pulmonary arteries. Enlarged right atrium. No significant pericardial effusion. The thoracic aorta is normal in caliber. No atherosclerotic plaque of the thoracic aorta. No coronary artery calcifications. Mediastinum/Nodes: No pneumomediastinum. No mediastinal hematoma formation. No enlarged mediastinal, hilar, or axillary lymph nodes. Thyroid gland, trachea, and esophagus demonstrate no significant findings. Lungs/Pleura: Peribronchovascular consolidative airspace opacities within the anterior inferior right upper lobe. No pulmonary nodule. No pulmonary mass. No pulmonary contusion or laceration. No pneumatocele formation. No pleural  effusion. No pneumothorax. Musculoskeletal: No abdominal wall hernia or abnormality. No suspicious lytic or blastic osseous lesions. No acute  displaced fracture. Multilevel degenerative changes of the spine. Review of the MIP images confirms the above findings. CT ABDOMEN and PELVIS FINDINGS Liver: Not enlarged. No focal lesion. No laceration or subcapsular hematoma. Biliary System: The gallbladder is otherwise unremarkable with no radio-opaque gallstones. No biliary ductal dilatation. Pancreas: Normal pancreatic contour. No main pancreatic duct dilatation. Spleen: Not enlarged. No focal lesion. No laceration, subcapsular hematoma, or vascular injury. Adrenal Glands: No nodularity bilaterally. Kidneys: Bilateral kidneys enhance symmetrically. No hydronephrosis. No contusion, laceration, or subcapsular hematoma. No injury to the vascular structures or collecting systems. No hydroureter. The urinary bladder is unremarkable. On delayed imaging, there is no urothelial wall thickening and there are no filling defects in the opacified portions of the bilateral collecting systems or ureters. Bowel: Under distension of the ascending colon with query bowel thickening. No large bowel dilatation. No small bowel wall thickening or dilatation. The appendix is unremarkable. Mesentery, Omentum, and Peritoneum: No simple free fluid ascites. No pneumoperitoneum. No hemoperitoneum. No mesenteric hematoma identified. No organized fluid collection. Pelvic Organs: The uterus and bilateral adnexal regions are unremarkable. Lymph Nodes: No abdominal, pelvic, inguinal lymphadenopathy. Vasculature: No abdominal aorta or iliac aneurysm. No active contrast extravasation or pseudoaneurysm. Musculoskeletal: No significant soft tissue hematoma. No acute pelvic fracture. No spinal fracture. Severe degenerative changes of the L4-L5 L5-S1 levels with intervertebral disc space vacuum phenomenon. Review of the MIP images confirms the above findings. IMPRESSION: 1. No pulmonary embolus. 2. Right upper lobe aspiration pneumonia. 3. Enlarged left and right main pulmonary arteries suggestive  of pulmonary hypertension. 4. Enlarged right atrium. 5. Under distension of the ascending colon with query mild bowel thickening. Correlate clinically for colitis. 6.  No acute traumatic injury to the chest, abdomen, or pelvis. 7. No acute fracture or traumatic malalignment of the thoracic or lumbar spine. Electronically Signed   By: Iven Finn M.D.   On: 08/28/2021 22:37   CT ABDOMEN PELVIS W CONTRAST  Result Date: 08/28/2021 CLINICAL DATA:  Pulmonary embolism (PE) suspected, high prob; Nausea/vomiting Sepsis EXAM: CT ANGIOGRAPHY CHEST CT ABDOMEN AND PELVIS WITH CONTRAST TECHNIQUE: Multidetector CT imaging of the chest was performed using the standard protocol during bolus administration of intravenous contrast. Multiplanar CT image reconstructions and MIPs were obtained to evaluate the vascular anatomy. Multidetector CT imaging of the abdomen and pelvis was performed using the standard protocol during bolus administration of intravenous contrast. CONTRAST:  45mL OMNIPAQUE IOHEXOL 350 MG/ML SOLN COMPARISON:  None. FINDINGS: CTA CHEST FINDINGS Cardiovascular: Satisfactory opacification of the pulmonary arteries to the segmental level. No evidence of pulmonary embolism. Enlarged left and right main pulmonary arteries. Enlarged right atrium. No significant pericardial effusion. The thoracic aorta is normal in caliber. No atherosclerotic plaque of the thoracic aorta. No coronary artery calcifications. Mediastinum/Nodes: No pneumomediastinum. No mediastinal hematoma formation. No enlarged mediastinal, hilar, or axillary lymph nodes. Thyroid gland, trachea, and esophagus demonstrate no significant findings. Lungs/Pleura: Peribronchovascular consolidative airspace opacities within the anterior inferior right upper lobe. No pulmonary nodule. No pulmonary mass. No pulmonary contusion or laceration. No pneumatocele formation. No pleural effusion. No pneumothorax. Musculoskeletal: No abdominal wall hernia or  abnormality. No suspicious lytic or blastic osseous lesions. No acute displaced fracture. Multilevel degenerative changes of the spine. Review of the MIP images confirms the above findings. CT ABDOMEN and PELVIS FINDINGS Liver: Not enlarged. No focal lesion. No laceration or subcapsular hematoma. Biliary System:  The gallbladder is otherwise unremarkable with no radio-opaque gallstones. No biliary ductal dilatation. Pancreas: Normal pancreatic contour. No main pancreatic duct dilatation. Spleen: Not enlarged. No focal lesion. No laceration, subcapsular hematoma, or vascular injury. Adrenal Glands: No nodularity bilaterally. Kidneys: Bilateral kidneys enhance symmetrically. No hydronephrosis. No contusion, laceration, or subcapsular hematoma. No injury to the vascular structures or collecting systems. No hydroureter. The urinary bladder is unremarkable. On delayed imaging, there is no urothelial wall thickening and there are no filling defects in the opacified portions of the bilateral collecting systems or ureters. Bowel: Under distension of the ascending colon with query bowel thickening. No large bowel dilatation. No small bowel wall thickening or dilatation. The appendix is unremarkable. Mesentery, Omentum, and Peritoneum: No simple free fluid ascites. No pneumoperitoneum. No hemoperitoneum. No mesenteric hematoma identified. No organized fluid collection. Pelvic Organs: The uterus and bilateral adnexal regions are unremarkable. Lymph Nodes: No abdominal, pelvic, inguinal lymphadenopathy. Vasculature: No abdominal aorta or iliac aneurysm. No active contrast extravasation or pseudoaneurysm. Musculoskeletal: No significant soft tissue hematoma. No acute pelvic fracture. No spinal fracture. Severe degenerative changes of the L4-L5 L5-S1 levels with intervertebral disc space vacuum phenomenon. Review of the MIP images confirms the above findings. IMPRESSION: 1. No pulmonary embolus. 2. Right upper lobe aspiration  pneumonia. 3. Enlarged left and right main pulmonary arteries suggestive of pulmonary hypertension. 4. Enlarged right atrium. 5. Under distension of the ascending colon with query mild bowel thickening. Correlate clinically for colitis. 6.  No acute traumatic injury to the chest, abdomen, or pelvis. 7. No acute fracture or traumatic malalignment of the thoracic or lumbar spine. Electronically Signed   By: Iven Finn M.D.   On: 08/28/2021 22:37   DG Chest Port 1 View  Result Date: 08/28/2021 CLINICAL DATA:  Status post seizure. EXAM: PORTABLE CHEST 1 VIEW COMPARISON:  August 15, 2021 FINDINGS: There is stable mild to moderate severity cardiomegaly with stable prominence of the perihilar vessels. Both lungs are clear. The visualized skeletal structures are unremarkable. IMPRESSION: Stable cardiomegaly without acute or active cardiopulmonary disease. Electronically Signed   By: Virgina Norfolk M.D.   On: 08/28/2021 20:17   DG Chest Port 1 View  Result Date: 08/15/2021 CLINICAL DATA:  Shortness of breath. EXAM: PORTABLE CHEST 1 VIEW COMPARISON:  Chest x-ray 06/25/2013. FINDINGS: The heart is enlarged, unchanged. The lungs and costophrenic angles are clear. There is no pneumothorax or acute fracture. IMPRESSION: 1. No acute cardiopulmonary process. 2. Stable cardiomegaly. Electronically Signed   By: Ronney Asters M.D.   On: 08/15/2021 15:06   EEG adult  Result Date: 08/29/2021 Lora Havens, MD     08/29/2021  3:02 PM Patient Name: Yvonne Tyler MRN: [id number] Epilepsy Attending: Lora Havens Referring Physician/Provider: Dr Ileene Musa Date: 08/29/2021 Duration: 22.10 mins Patient history: 66yo F with syncope. EEG to evaluate for seizure. Level of alertness: Awake AEDs during EEG study: None Technical aspects: This EEG study was done with scalp electrodes positioned according to the 10-20 International system of electrode placement. Electrical activity was acquired at a sampling rate of $Remov'500Hz'Ngscsk$  and  reviewed with a high frequency filter of $RemoveB'70Hz'HYqqpMEo$  and a low frequency filter of $RemoveB'1Hz'vaKHfagr$ . EEG data were recorded continuously and digitally stored. Description: The posterior dominant rhythm consists of 8 Hz activity of moderate voltage (25-35 uV) seen predominantly in posterior head regions, symmetric and reactive to eye opening and eye closing. Hyperventilation and photic stimulation were not performed.   IMPRESSION: This study is within normal limits.  No seizures or epileptiform discharges were seen throughout the recording. Priyanka Barbra Sarks     Subjective: Patient seen examined bedside, resting comfortably.  Lying in bed.  No complaints this morning.  Guardian present.  Ready for discharge home.  Denies headache, no fever/chills, no nausea/vomiting/diarrhea, no chest pain, no palpitations, no shortness of breath, no abdominal pain, no weakness.  No acute concerns overnight per nursing staff.  Discharge Exam: Vitals:   08/30/21 2107 08/31/21 0345  BP: 114/67 (!) 131/95  Pulse: 65 72  Resp: 20 19  Temp: 98.3 F (36.8 C) 97.6 F (36.4 C)  SpO2: 100% 95%   Vitals:   08/30/21 0252 08/30/21 1529 08/30/21 2107 08/31/21 0345  BP: (!) 126/98 (!) 107/57 114/67 (!) 131/95  Pulse: 84 69 65 72  Resp:  $Remo'20 20 19  'fsNtJ$ Temp: 98.6 F (37 C) 98.7 F (37.1 C) 98.3 F (36.8 C) 97.6 F (36.4 C)  TempSrc: Oral Axillary Oral Oral  SpO2: 98% 98% 100% 95%  Weight:      Height:        General: Pt is alert, awake, not in acute distress Cardiovascular: RRR, S1/S2 +, no rubs, no gallops Respiratory: CTA bilaterally, no wheezing, no rhonchi, on room air Abdominal: Soft, NT, ND, bowel sounds + Extremities: no edema, no cyanosis    The results of significant diagnostics from this hospitalization (including imaging, microbiology, ancillary and laboratory) are listed below for reference.     Microbiology: Recent Results (from the past 240 hour(s))  Resp Panel by RT-PCR (Flu A&B, Covid) Nasopharyngeal Swab      Status: None   Collection Time: 08/28/21  7:19 PM   Specimen: Nasopharyngeal Swab; Nasopharyngeal(NP) swabs in vial transport medium  Result Value Ref Range Status   SARS Coronavirus 2 by RT PCR NEGATIVE NEGATIVE Final    Comment: (NOTE) SARS-CoV-2 target nucleic acids are NOT DETECTED.  The SARS-CoV-2 RNA is generally detectable in upper respiratory specimens during the acute phase of infection. The lowest concentration of SARS-CoV-2 viral copies this assay can detect is 138 copies/mL. A negative result does not preclude SARS-Cov-2 infection and should not be used as the sole basis for treatment or other patient management decisions. A negative result may occur with  improper specimen collection/handling, submission of specimen other than nasopharyngeal swab, presence of viral mutation(s) within the areas targeted by this assay, and inadequate number of viral copies(<138 copies/mL). A negative result must be combined with clinical observations, patient history, and epidemiological information. The expected result is Negative.  Fact Sheet for Patients:  EntrepreneurPulse.com.au  Fact Sheet for Healthcare Providers:  IncredibleEmployment.be  This test is no t yet approved or cleared by the [place] FDA and  has been authorized for detection and/or diagnosis of SARS-CoV-2 by FDA under an Emergency Use Authorization (EUA). This EUA will remain  in effect (meaning this test can be used) for the duration of the COVID-19 declaration under Section 564(b)(1) of the Act, 21 U.S.C.section 360bbb-3(b)(1), unless the authorization is terminated  or revoked sooner.       Influenza A by PCR NEGATIVE NEGATIVE Final   Influenza B by PCR NEGATIVE NEGATIVE Final    Comment: (NOTE) The Xpert Xpress SARS-CoV-2/FLU/RSV plus assay is intended as an aid in the diagnosis of influenza from Nasopharyngeal swab specimens and should not be used as a sole basis  for treatment. Nasal washings and aspirates are unacceptable for Xpert Xpress SARS-CoV-2/FLU/RSV testing.  Fact Sheet for Patients: EntrepreneurPulse.com.au  Fact Sheet for  Healthcare Providers: IncredibleEmployment.be  This test is not yet approved or cleared by the [place] and has been authorized for detection and/or diagnosis of SARS-CoV-2 by FDA under an Emergency Use Authorization (EUA). This EUA will remain in effect (meaning this test can be used) for the duration of the COVID-19 declaration under Section 564(b)(1) of the Act, 21 U.S.C. section 360bbb-3(b)(1), unless the authorization is terminated or revoked.  Performed at [facility], [place] [address], [place], [place] [address]   Blood Culture (routine x 2)     Status: None (Preliminary result)   Collection Time: 08/28/21  7:19 PM   Specimen: BLOOD  Result Value Ref Range Status   Specimen Description   Final    BLOOD RIGHT ANTECUBITAL Performed at [facility] [address], [place], [place] [address]    Special Requests   Final    BOTTLES DRAWN AEROBIC AND ANAEROBIC Blood Culture adequate volume Performed at [facility] [address] [place], [place] [address]    Culture   Final    NO GROWTH 2 DAYS Performed at [facility] [address], [place], [place] [address]    Report Status PENDING  Incomplete  Urine Culture     Status: None   Collection Time: 08/28/21  7:19 PM   Specimen: In/Out Cath Urine  Result Value Ref Range Status   Specimen Description   Final    IN/OUT CATH URINE Performed at [facility] [address], [place], [place] [address]    Special Requests   Final    NONE Performed at [facility], [place] [address], [place], [place] [address]    Culture   Final    NO GROWTH Performed at [facility], [place] [address], [place], [place]  [address]    Report Status 08/30/2021 FINAL  Final  Blood Culture (routine x 2)     Status: None (Preliminary result)   Collection Time: 08/28/21  7:24 PM   Specimen: BLOOD  Result Value Ref Range Status   Specimen Description   Final    BLOOD LEFT ANTECUBITAL Performed at [facility] [address], [place], [place] [address]    Special Requests   Final    BOTTLES DRAWN AEROBIC AND ANAEROBIC Blood Culture adequate volume Performed at [facility] [address], [place], [place] [address]    Culture   Final    NO GROWTH 2 DAYS Performed at [facility] [address], [place],  [address]    Report Status PENDING  Incomplete     Labs: BNP (last 3 results) No results for input(s): BNP in the last 8760 hours. Basic Metabolic Panel: Recent Labs  Lab 08/28/21 1933 08/29/21 0217 08/30/21 0329 08/31/21 0509  NA 138 138 142 137  K 4.0 3.6 3.6 3.5  CL 106 106 106 105  CO2 $Re'24 25 25 22  'KFL$ GLUCOSE 120* 125* 104* 108*  BUN $Re'13 11 9 8  'cSG$ CREATININE 1.03* 0.89 0.84 0.83  CALCIUM 8.6* 8.0* 8.2* 8.1*   Liver Function Tests: Recent Labs  Lab 08/28/21 1933  AST 21  ALT 16  ALKPHOS 43  BILITOT 1.2  PROT 7.2  ALBUMIN 3.5   Recent Labs  Lab 08/28/21 1933  LIPASE 21   No results for input(s): AMMONIA in the last 168 hours. CBC: Recent Labs  Lab 08/28/21 1933 08/29/21 0217 08/30/21 0329 08/31/21 8768  WBC 15.7* 26.1* 15.1* 9.9  NEUTROABS 14.5*  --   --   --   HGB 13.1 12.0 11.8* 11.3*  HCT 39.8 36.9 35.7* 34.0*  MCV 109.9* 112.2* 109.5* 109.3*  PLT 279 248 241 208   Cardiac Enzymes: No results for input(s): CKTOTAL, CKMB, CKMBINDEX, TROPONINI in the last 168 hours. BNP: Invalid input(s): POCBNP CBG: Recent Labs  Lab 08/28/21 1953  GLUCAP 93   D-Dimer No results for input(s): DDIMER in the last 72 hours. Hgb A1c No results for input(s): HGBA1C in the last 72 hours. Lipid Profile No results for input(s): CHOL,  HDL, LDLCALC, TRIG, CHOLHDL, LDLDIRECT in the last 72 hours. Thyroid function studies No results for input(s): TSH, T4TOTAL, T3FREE, THYROIDAB in the last 72 hours.  Invalid input(s): FREET3 Anemia work up No results for input(s): VITAMINB12, FOLATE, FERRITIN, TIBC, IRON, RETICCTPCT in the last 72 hours. Urinalysis    Component Value Date/Time   COLORURINE YELLOW 08/28/2021 [place] 08/28/2021 0513   LABSPEC 1.027 08/28/2021 0513   PHURINE 7.0 08/28/2021 0513   GLUCOSEU NEGATIVE 08/28/2021 0513   HGBUR NEGATIVE 08/28/2021 0513   BILIRUBINUR NEGATIVE 08/28/2021 0513   KETONESUR NEGATIVE 08/28/2021 0513   PROTEINUR NEGATIVE 08/28/2021 0513   UROBILINOGEN 0.2 06/10/2011 1552   NITRITE NEGATIVE 08/28/2021 0513   LEUKOCYTESUR TRACE (A) 08/28/2021 0513   Sepsis Labs Invalid input(s): PROCALCITONIN,  WBC,  LACTICIDVEN Microbiology Recent Results (from the past 240 hour(s))  Resp Panel by RT-PCR (Flu A&B, Covid) Nasopharyngeal Swab     Status: None   Collection Time: 08/28/21  7:19 PM   Specimen: Nasopharyngeal Swab; Nasopharyngeal(NP) swabs in vial transport medium  Result Value Ref Range Status   SARS Coronavirus 2 by RT PCR NEGATIVE NEGATIVE Final    Comment: (NOTE) SARS-CoV-2 target nucleic acids are NOT DETECTED.  The SARS-CoV-2 RNA is generally detectable in upper respiratory specimens during the acute phase of infection. The lowest concentration of SARS-CoV-2 viral copies this assay can detect is 138 copies/mL. A negative result does not preclude SARS-Cov-2 infection and should not be used as the sole basis for treatment or other patient management decisions. A negative result may occur with  improper specimen collection/handling, submission of specimen other than nasopharyngeal swab, presence of viral mutation(s) within the areas targeted by this assay, and inadequate number of viral copies(<138 copies/mL). A negative result must be combined with clinical  observations, patient history, and epidemiological information. The expected result is Negative.  Fact Sheet for Patients:  EntrepreneurPulse.com.au  Fact Sheet for Healthcare Providers:  IncredibleEmployment.be  This test is no t yet approved or cleared by the [place] FDA and  has been authorized for detection and/or diagnosis of SARS-CoV-2 by FDA under an Emergency Use Authorization (EUA). This EUA will remain  in effect (meaning this test can be used) for the duration of the COVID-19 declaration under Section 564(b)(1) of the Act, 21 U.S.C.section 360bbb-3(b)(1), unless the authorization is terminated  or revoked sooner.       Influenza A by PCR NEGATIVE NEGATIVE Final   Influenza B by PCR NEGATIVE NEGATIVE Final    Comment: (NOTE) The Xpert Xpress SARS-CoV-2/FLU/RSV plus assay is intended as an aid in the diagnosis of influenza from Nasopharyngeal swab specimens and should not be used as a sole basis for treatment. Nasal washings and aspirates are unacceptable for Xpert Xpress SARS-CoV-2/FLU/RSV testing.  Fact Sheet for Patients: EntrepreneurPulse.com.au  Fact Sheet for Healthcare Providers: IncredibleEmployment.be  This test is not  yet approved or cleared by the [place] and has been authorized for detection and/or diagnosis of SARS-CoV-2 by FDA under an Emergency Use Authorization (EUA). This EUA will remain in effect (meaning this test can be used) for the duration of the COVID-19 declaration under Section 564(b)(1) of the Act, 21 U.S.C. section 360bbb-3(b)(1), unless the authorization is terminated or revoked.  Performed at [facility], [place] [address] [place], [place] [address]   Blood Culture (routine x 2)     Status: None (Preliminary result)   Collection Time: 08/28/21  7:19 PM   Specimen: BLOOD  Result Value Ref Range Status   Specimen Description    Final    BLOOD RIGHT ANTECUBITAL Performed at [facility] [address], [place], [place] [address]    Special Requests   Final    BOTTLES DRAWN AEROBIC AND ANAEROBIC Blood Culture adequate volume Performed at [facility] [address] [place], [place] [address]    Culture   Final    NO GROWTH 2 DAYS Performed at [facility] [address] [place], [place] [address]    Report Status PENDING  Incomplete  Urine Culture     Status: None   Collection Time: 08/28/21  7:19 PM   Specimen: In/Out Cath Urine  Result Value Ref Range Status   Specimen Description   Final    IN/OUT CATH URINE Performed at [facility] [address] [place], [place] [address]    Special Requests   Final    NONE Performed at [facility], [place] [address], [place], [place] [address]    Culture   Final    NO GROWTH Performed at [facility], [place] [address] [place], [place] [address]    Report Status 08/30/2021 FINAL  Final  Blood Culture (routine x 2)     Status: None (Preliminary result)   Collection Time: 08/28/21  7:24 PM   Specimen: BLOOD  Result Value Ref Range Status   Specimen Description   Final    BLOOD LEFT ANTECUBITAL Performed at [facility] [address], [place], [place] [address]    Special Requests   Final    BOTTLES DRAWN AEROBIC AND ANAEROBIC Blood Culture adequate volume Performed at [facility] [address], [place], [place] [address]    Culture   Final    NO GROWTH 2 DAYS Performed at [facility] [address] [place],  [address]    Report Status PENDING  Incomplete     Time coordinating discharge: Over 30 minutes  SIGNED:   Dontreal Miera J [place] DO  [facility] 08/31/2021, 11:13 AM

## 2021-08-31 NOTE — Progress Notes (Signed)
At discharge, the patient departed with cargegiver  to transport the patient home. The patient was discharged with discharge instructions,  belongings and verbalized understanding of all instructions before departure. VSS upon discharge. All hospital equipment was removed from the patient. The patient nor caregiver endorse any additional questions at this time.

## 2021-09-03 LAB — CULTURE, BLOOD (ROUTINE X 2)
Culture: NO GROWTH
Culture: NO GROWTH
Special Requests: ADEQUATE
Special Requests: ADEQUATE

## 2021-09-07 ENCOUNTER — Encounter: Payer: Self-pay | Admitting: Family Medicine

## 2021-09-07 ENCOUNTER — Ambulatory Visit (INDEPENDENT_AMBULATORY_CARE_PROVIDER_SITE_OTHER): Payer: Medicare Other

## 2021-09-07 ENCOUNTER — Ambulatory Visit (INDEPENDENT_AMBULATORY_CARE_PROVIDER_SITE_OTHER): Payer: Medicare Other | Admitting: Family Medicine

## 2021-09-07 ENCOUNTER — Other Ambulatory Visit: Payer: Self-pay

## 2021-09-07 VITALS — BP 110/64 | Wt 110.0 lb

## 2021-09-07 DIAGNOSIS — H548 Legal blindness, as defined in USA: Secondary | ICD-10-CM

## 2021-09-07 DIAGNOSIS — Z23 Encounter for immunization: Secondary | ICD-10-CM

## 2021-09-07 DIAGNOSIS — Z09 Encounter for follow-up examination after completed treatment for conditions other than malignant neoplasm: Secondary | ICD-10-CM

## 2021-09-07 DIAGNOSIS — Z13820 Encounter for screening for osteoporosis: Secondary | ICD-10-CM

## 2021-09-07 NOTE — Patient Instructions (Addendum)
Stop by the pharmacy to pick up a multivitamin that contains calcium, Vit D and iron.   You should get a call to schedule her Osteoporosis screen.      Take Care,  Dr. Rachael Darby

## 2021-09-07 NOTE — Progress Notes (Signed)
° °  SUBJECTIVE:   CHIEF COMPLAINT / HPI:   Chief Complaint  Patient presents with   Hospitalization Follow-up     Yvonne Tyler is a 66 y.o. female here for CPAP hospital follow-up.  She is here with her guardian today.  Guardian reports patient has greatly improved since hospitalization.  She continues to have a mild cough.  "It has really cleared up."  She has had no fever.  She is due to complete her cefdinir today.  She never got the  Azithromycin prescription filled even after calling back to the hospital.    PERTINENT  PMH / PSH: reviewed and updated as appropriate   OBJECTIVE:   BP 110/64    Wt 110 lb (49.9 kg)    LMP  (LMP Unknown) Comment: Mentaly challenged   BMI 21.48 kg/m     GEN: pleasant well appearing female with syndromic facies  CV: regular rate and rhythm, systolic murmur appreciated  RESP: no increased work of breathing, clear to ascultation bilaterally SKIN: warm, dry, no rash on visible skin NEURO: alert, incoherent speech (baseline per guardian)   ASSESSMENT/PLAN:   Hospital follow-up  community-acquired pneumonia Patient is a 66 year old female with Down syndrome here for hospital follow-up.  She is to complete her cefdinir today.  Though she did not receive azithromycin I do not think this is needed at this point.  Respiratory exam is unremarkable.  Guardian agrees with this plan.  In reviewing her lab work, she has macrocytosis.  History of B12 deficiency.  Advised to take multivitamin daily.  Healthcare maintenance Patient due for DEXA scan.  Discussed with guardian and she is agreeable.  Influenza and COVID booster given today.   Needs follow-up for annual physical.  Guardian agrees to schedule.  Katha Cabal, DO PGY-3, [facility] 09/07/2021

## 2021-09-10 ENCOUNTER — Encounter: Payer: Self-pay | Admitting: Family Medicine

## 2021-09-20 ENCOUNTER — Other Ambulatory Visit: Payer: Self-pay | Admitting: Family Medicine

## 2021-09-20 DIAGNOSIS — Z1231 Encounter for screening mammogram for malignant neoplasm of breast: Secondary | ICD-10-CM

## 2021-10-10 ENCOUNTER — Ambulatory Visit: Payer: Medicare Other

## 2021-10-11 ENCOUNTER — Other Ambulatory Visit: Payer: Self-pay

## 2021-10-11 ENCOUNTER — Ambulatory Visit
Admission: RE | Admit: 2021-10-11 | Discharge: 2021-10-11 | Disposition: A | Discharge status: Home / Self Care | Payer: Medicare Other | Source: Ambulatory Visit | Attending: Obstetrics & Gynecology | Admitting: Obstetrics & Gynecology

## 2021-10-11 DIAGNOSIS — Z1231 Encounter for screening mammogram for malignant neoplasm of breast: Secondary | ICD-10-CM

## 2021-10-20 ENCOUNTER — Ambulatory Visit: Payer: Medicaid Other | Admitting: Podiatry

## 2021-10-26 ENCOUNTER — Ambulatory Visit (INDEPENDENT_AMBULATORY_CARE_PROVIDER_SITE_OTHER): Payer: Medicaid Other | Admitting: Podiatry

## 2021-10-26 ENCOUNTER — Other Ambulatory Visit: Payer: Self-pay

## 2021-10-26 ENCOUNTER — Encounter: Payer: Self-pay | Admitting: Podiatry

## 2021-10-26 DIAGNOSIS — B351 Tinea unguium: Secondary | ICD-10-CM | POA: Diagnosis not present

## 2021-10-26 DIAGNOSIS — M79674 Pain in right toe(s): Secondary | ICD-10-CM

## 2021-10-26 NOTE — Progress Notes (Signed)
Complaint:  ?Visit Type: Patient returns to my office for continued preventative foot care services. Complaint: Patient states" my nails have grown long and thick and become painful to walk and wear shoes"  The patient presents for preventative foot care services. No changes to ROS.  Patient presents to the office with her caregiver.. ? ?Podiatric Exam: ?Vascular: dorsalis pedis and posterior tibial pulses are palpable bilateral. Capillary return is immediate. Temperature gradient is WNL. Skin turgor WNL  ?Sensorium: Normal Semmes Weinstein monofilament test. Normal tactile sensation bilaterally. ?Nail Exam: Pt has thick disfigured discolored nails with subungual debris noted bilateral entire nail hallux through fifth toenails,  Especially right great toenail. ?Ulcer Exam: There is no evidence of ulcer or pre-ulcerative changes or infection. ?Orthopedic Exam: Muscle tone and strength are WNL. No limitations in general ROM. No crepitus or effusions noted. HAV  B/L.  Hammer toes  B/L. ?Skin: No Porokeratosis. No infection or ulcers ? ?Diagnosis:  ?Onychomycosis, , Pain in right toe, pain in left toes ? ?Treatment & Plan ?Procedures and Treatment: Consent by patient was obtained for treatment procedures.   Debridement of mycotic and hypertrophic toenails, 1 through 5 bilateral and clearing of subungual debris. No ulceration, no infection noted.  ?Return Visit-Office Procedure: Patient instructed to return to the office for a follow up visit 4    months for continued evaluation and treatment. ? ? ? ?Helane Gunther DPM ?

## 2022-03-01 ENCOUNTER — Ambulatory Visit: Payer: Medicare Other | Admitting: Podiatry

## 2022-03-12 ENCOUNTER — Emergency Department (HOSPITAL_COMMUNITY): Payer: Medicare Other

## 2022-03-12 ENCOUNTER — Observation Stay (HOSPITAL_COMMUNITY): Payer: Medicare Other

## 2022-03-12 ENCOUNTER — Other Ambulatory Visit: Payer: Self-pay

## 2022-03-12 ENCOUNTER — Encounter (HOSPITAL_COMMUNITY): Payer: Self-pay | Admitting: *Deleted

## 2022-03-12 ENCOUNTER — Observation Stay (HOSPITAL_COMMUNITY)
Admission: EM | Admit: 2022-03-12 | Discharge: 2022-03-15 | Disposition: A | Discharge status: Home / Self Care | Payer: Medicare Other | Attending: Family Medicine | Admitting: Family Medicine

## 2022-03-12 DIAGNOSIS — R4182 Altered mental status, unspecified: Secondary | ICD-10-CM | POA: Diagnosis not present

## 2022-03-12 DIAGNOSIS — Q909 Down syndrome, unspecified: Secondary | ICD-10-CM

## 2022-03-12 DIAGNOSIS — N179 Acute kidney failure, unspecified: Secondary | ICD-10-CM | POA: Diagnosis present

## 2022-03-12 DIAGNOSIS — H548 Legal blindness, as defined in USA: Secondary | ICD-10-CM | POA: Diagnosis present

## 2022-03-12 DIAGNOSIS — R55 Syncope and collapse: Secondary | ICD-10-CM | POA: Diagnosis not present

## 2022-03-12 DIAGNOSIS — I959 Hypotension, unspecified: Secondary | ICD-10-CM | POA: Insufficient documentation

## 2022-03-12 DIAGNOSIS — Q211 Atrial septal defect, unspecified: Secondary | ICD-10-CM | POA: Diagnosis not present

## 2022-03-12 LAB — URINALYSIS, ROUTINE W REFLEX MICROSCOPIC
Bilirubin Urine: NEGATIVE
Glucose, UA: NEGATIVE mg/dL
Hgb urine dipstick: NEGATIVE
Ketones, ur: NEGATIVE mg/dL
Leukocytes,Ua: NEGATIVE
Nitrite: NEGATIVE
Protein, ur: NEGATIVE mg/dL
Specific Gravity, Urine: 1.003 — ABNORMAL LOW (ref 1.005–1.030)
pH: 5 (ref 5.0–8.0)

## 2022-03-12 LAB — COMPREHENSIVE METABOLIC PANEL
ALT: 11 U/L (ref 0–44)
AST: 19 U/L (ref 15–41)
Albumin: 3.5 g/dL (ref 3.5–5.0)
Alkaline Phosphatase: 54 U/L (ref 38–126)
Anion gap: 9 (ref 5–15)
BUN: 24 mg/dL — ABNORMAL HIGH (ref 8–23)
CO2: 25 mmol/L (ref 22–32)
Calcium: 8.8 mg/dL — ABNORMAL LOW (ref 8.9–10.3)
Chloride: 106 mmol/L (ref 98–111)
Creatinine, Ser: 1.39 mg/dL — ABNORMAL HIGH (ref 0.44–1.00)
GFR, Estimated: 42 mL/min — ABNORMAL LOW (ref >60)
Glucose, Bld: 116 mg/dL — ABNORMAL HIGH (ref 70–99)
Potassium: 4.2 mmol/L (ref 3.5–5.1)
Sodium: 140 mmol/L (ref 135–145)
Total Bilirubin: 1.1 mg/dL (ref 0.3–1.2)
Total Protein: 6.9 g/dL (ref 6.5–8.1)

## 2022-03-12 LAB — CBC
HCT: 42 % (ref 36.0–46.0)
Hemoglobin: 13.9 g/dL (ref 12.0–15.0)
MCH: 36.6 pg — ABNORMAL HIGH (ref 26.0–34.0)
MCHC: 33.1 g/dL (ref 30.0–36.0)
MCV: 110.5 fL — ABNORMAL HIGH (ref 80.0–100.0)
Platelets: 204 10*3/uL (ref 150–400)
RBC: 3.8 MIL/uL — ABNORMAL LOW (ref 3.87–5.11)
RDW: 12.7 % (ref 11.5–15.5)
WBC: 6.6 10*3/uL (ref 4.0–10.5)
nRBC: 0 % (ref 0.0–0.2)

## 2022-03-12 LAB — TROPONIN I (HIGH SENSITIVITY)
Troponin I (High Sensitivity): 5 ng/L (ref <18)
Troponin I (High Sensitivity): 6 ng/L (ref <18)
Troponin I (High Sensitivity): 4 ng/L (ref <18)

## 2022-03-12 LAB — LACTIC ACID, PLASMA
Lactic Acid, Venous: 1.4 mmol/L (ref 0.5–1.9)
Lactic Acid, Venous: 1.3 mmol/L (ref 0.5–1.9)

## 2022-03-12 LAB — LIPASE, BLOOD: Lipase: 28 U/L (ref 11–51)

## 2022-03-12 LAB — HIV ANTIBODY (ROUTINE TESTING W REFLEX): HIV Screen 4th Generation wRfx: NONREACTIVE

## 2022-03-12 LAB — ETHANOL: Alcohol, Ethyl (B): <10 mg/dL (ref <10)

## 2022-03-12 MED ORDER — ENOXAPARIN SODIUM 40 MG/0.4ML IJ SOSY
40.0000 mg | PREFILLED_SYRINGE | INTRAMUSCULAR | Status: DC
  Administered 2022-03-13 – 2022-03-15 (×3): 40 mg via SUBCUTANEOUS
  Filled 2022-03-12 (×3): qty 0.4

## 2022-03-12 MED ORDER — ENOXAPARIN SODIUM 40 MG/0.4ML IJ SOSY: 40.0000 mg | PREFILLED_SYRINGE | INTRAMUSCULAR | Status: DC

## 2022-03-12 MED ORDER — LACTATED RINGERS IV BOLUS
1000.0000 mL | Freq: Once | INTRAVENOUS | Status: AC
  Administered 2022-03-12: 1000 mL via INTRAVENOUS

## 2022-03-12 MED ORDER — SODIUM CHLORIDE 0.9 % IV SOLN: INTRAVENOUS | Status: DC

## 2022-03-12 NOTE — ED Triage Notes (Signed)
[place] with family and patient had several syncopal events. Denies chest pain. Down Syndrome and Seizures is only history.  Pt had no radials, diaphoretic, and pale on arrival by EMS. Pt has had NS 300cc bolus en route to ED. Family is on the way. Pt states she drinks etoh, but not sure frequency.

## 2022-03-12 NOTE — ED Provider Notes (Signed)
[facility] EMERGENCY DEPARTMENT Provider Note   CSN: [id number] Arrival date & time: 03/12/22  1301     History  Chief Complaint  Patient presents with   Loss of Consciousness   Hypotension    Yvonne Tyler is a 66 y.o. female.  HPI Level 5 caveat due to patient confusion and poor historian 66 year old female history of Down syndrome presents today with reports of hypotension.  And falls EKG transmitted prehospital with some ST elevation in 2 3 and aVF but no report of chest pain.  Family is reported to have a and round     Home Medications Prior to Admission medications   Medication Sig Start Date End Date Taking? Authorizing Provider  Emollient (EUCERIN ADVANCED REPAIR EX) Apply 1 application topically as needed (Rash on back).    [provider]  Misc Natural Products (ZARBEES COUGH+IMMUNE) SYRP Take 1 Dose/kg by mouth as needed (cough).    [provider]      Allergies    Patient has no known allergies.    Review of Systems   Review of Systems  Physical Exam Updated Vital Signs BP 109/69   Pulse (!) 58   Temp 98 F (36.7 C) (Oral)   Resp 16   LMP  (LMP Unknown) Comment: Mentaly challenged  SpO2 100%  Physical Exam Vitals and nursing note reviewed.  Constitutional:      General: She is not in acute distress.    Appearance: She is not ill-appearing.  HENT:     Head: Normocephalic.     Right Ear: External ear normal.     Left Ear: External ear normal.     Nose: Nose normal.     Mouth/Throat:     Pharynx: Oropharynx is clear.  Eyes:     Comments: Left eye obscured by cataract  Cardiovascular:     Rate and Rhythm: Normal rate and regular rhythm.     Pulses: Normal pulses.  Pulmonary:     Effort: Pulmonary effort is normal.  Abdominal:     General: Abdomen is flat.     Palpations: Abdomen is soft.  Musculoskeletal:        General: Normal range of motion.     Cervical back: Normal range of motion.  Skin:     General: Skin is warm.     Capillary Refill: Capillary refill takes less than 2 seconds.  Neurological:     General: No focal deficit present.     Mental Status: She is alert.     Cranial Nerves: No cranial nerve deficit.     Motor: No weakness.  Psychiatric:        Mood and Affect: Mood normal.     ED Results / Procedures / Treatments   Labs (all labs ordered are listed, but only abnormal results are displayed) Labs Reviewed  CBC - Abnormal; Notable for the following components:      Result Value   RBC 3.80 (*)    MCV 110.5 (*)    MCH 36.6 (*)    All other components within normal limits  COMPREHENSIVE METABOLIC PANEL - Abnormal; Notable for the following components:   Glucose, Bld 116 (*)    BUN 24 (*)    Creatinine, Ser 1.39 (*)    Calcium 8.8 (*)    GFR, Estimated 42 (*)    All other components within normal limits  CULTURE, BLOOD (ROUTINE X 2)  CULTURE, BLOOD (ROUTINE X 2)  LACTIC ACID,  PLASMA  LIPASE, BLOOD  ETHANOL  LACTIC ACID, PLASMA  URINALYSIS, ROUTINE W REFLEX MICROSCOPIC  TROPONIN I (HIGH SENSITIVITY)  TROPONIN I (HIGH SENSITIVITY)    EKG EKG Interpretation  Date/Time:  Sunday March 12 2022 13:06:04 EDT Ventricular Rate:  53 PR Interval:  174 QRS Duration: 91 QT Interval:  457 QTC Calculation: 430 R Axis:   100 Text Interpretation: Sinus rhythm Left atrial enlargement Consider right ventricular hypertrophy ST elevation, consider inferior injury Confirmed by Kristine Royal [phone]) on 03/12/2022 3:27:05 PM  Radiology DG Chest Port 1 View  Result Date: 03/12/2022 CLINICAL DATA:  Weakness.  Loss of consciousness. EXAM: PORTABLE CHEST 1 VIEW COMPARISON:  One view chest x-Sarahmarie Leavey 08/28/2021 FINDINGS: Heart is enlarged. Mild pulmonary vascular congestion is present without frank edema. No focal airspace consolidation is present. No effusions are present. Visualized soft tissues and bony thorax are unremarkable. IMPRESSION: Cardiomegaly and mild pulmonary  vascular congestion without frank edema. Electronically Signed   By: Marin Roberts M.D.   On: 03/12/2022 14:09    Procedures Procedures    Medications Ordered in ED Medications  lactated ringers bolus 1,000 mL (1,000 mLs Intravenous New Bag/Given 03/12/22 1436)    ED Course/ Medical Decision Making/ A&P Clinical Course as of 03/12/22 1530  Sun Mar 12, 2022  1319 Patient initiated as code STEMI prehospital based on EKG.  KG reviewed by cardiology and canceled EKG here in ED with normal ST elevation in 2 3 and aVF without reciprocal changes [DR]  1500 Reviewed blood pressures and increased systolic currently 100 and heart rate 58 [DR]  1501 X-Courtenay Hirth reviewed interpreted no evidence of acute abnormality noted radiologist interpretation is cardiomegaly with mild vascular congestion without edema [DR]  1521 Complete metabolic panel reviewed interpreted creatinine is elevated at 1.39 with baseline of 0.8 [DR]    Clinical Course User Index [DR] Margarita Grizzle, MD                           Medical Decision Making 66 year old female presents today with syncope and hypotension Differential diagnosis includes but is not limited to acute coronary syndrome, MI, volume depletion, dehydration, heat exhaustion, metabolic abnormalities, hypoglycemia, infectious etiology, bleeding and severe anemia Patient evaluated here with EKG with minimal ST elevation inferior leads normal troponin but will need to be trended CBC without any evidence of acute anemia Patient with increased creatinine consistent with volume depletion she is receiving IV fluids Electrolyte/acute abnormality She has improved here with some IV fluids. Family was not here my initial evaluation and has been here and left again.  They do plan to return. Care discussed with Dr. Rodena Medin and he has assumed care at this time.  Amount and/or Complexity of Data Reviewed Labs: ordered. Decision-making details documented in ED  Course. Radiology: ordered and independent interpretation performed. Decision-making details documented in ED Course. ECG/medicine tests: ordered and independent interpretation performed. Decision-making details documented in ED Course.           Final Clinical Impression(s) / ED Diagnoses Final diagnoses:  Syncope and collapse  Hypotension, unspecified hypotension type  AKI (acute kidney injury) [facility])    Rx / DC Orders ED Discharge Orders     None         Margarita Grizzle, MD 03/12/22 1530

## 2022-03-12 NOTE — H&P (Cosign Needed Addendum)
FMTS Attending Admission Note: Terisa Starr, MD   For questions about this patient, please use [email or web address] to page the family medicine resident on call. Pager number [phone].    I  have seen and examined this patient, reviewed their chart. I have discussed this patient with the resident. I agree with the resident's findings, assessment and care plan.  66 yo with history of Down syndrome, B12 deficiency, and blindness presenting with likely vasovagal syncope. This morning, she is ambulating in halls, talkative, appropriate.  Reviewed history and physical below, edits within.  Labs, EKG, imaging all reviewed.   Vitals:   03/13/22 1125 03/13/22 1132  BP: (!) 107/48   Pulse: 64   Resp: (!) 29   Temp:  98.1 F (36.7 C)  SpO2: 97%    Exam  Cardiac: Regular rate and rhythm. Normal S1/S2. No murmurs, rubs, or gallops appreciated. Lungs: Clear bilaterally to ascultation.  Abdomen: Normoactive bowel sounds. No tenderness to deep or light palpation. No rebound or guarding.   Psych: Pleasant and appropriate  Ambulating halls, speech is difficult to understand but at baseline L eye with cataract    Syncope, likely due dehydration and vasovagal symptoms. EEG, echocardiogram pending. She is improved with hydration, will plan for likely discharge today pending results. Will defer Neurology consultation pending EEG.         Hospital Admission History and Physical Service Pager: [phone]  Patient name: DERENDA GIDDINGS Medical record number: [id number] Date of Birth: 1955/10/21 Age: 66 y.o. Gender: female  Primary Care Provider: Alfredo Martinez, MD Consultants: Neurology tomorrow  Code Status: DNR Preferred Emergency Contact:  [name] [phone]  [place] [phone]  Contact Information     Name Relation Home Work Mobile   [place] Relative [phone]     [place],[name] Relative   [phone]   [place],(Cousin)Adrian Relative   [phone]       Chief  Complaint: Found unconscious   Assessment and Plan: TINY CHAUDHARY is a 66 y.o. female presenting after syncope while outside in heat. Differential for this patient's presentation of this includes cardiac cause (vasovagal, arhythmia), neurologic cause such as TIA, electrolyte abnormalities, hypoglycemia, heat exhaustion, infection.  * Syncope H/o syncopal episodes. EEG at last admission unremarkable. Most likely due to heat exhaustion or dehydration in the setting of [place], cannot rule out arrhythmia. Patient's cousin says that patient would not tolerate holter monitor.  - Admit to FPTS, Attending Dr. Terisa Starr - Consult Neurology tomorrow, 7/17 - Cardiac telemetry  - CT Head  - ECHO ordered - Hydrate with IVF  - Trend troponin  - Orthostatic vital signs  - PT/OT Eval and Treat   AKI (acute kidney injury) (HCC) Cr on admission 1.39 up from baseline of 0.85. Most likely prerenal secondary due to dehydration in the setting of being outside in the heat.  - hydrate with maintenance fluids  - AM BMP  - Avoid nephrotoxic medications  - Bladder scans overnight    Chronic conditions:  Down syndrome  Blindess   FEN/GI: Regular  VTE Prophylaxis: Lovenox   Disposition: Admit to FPTS, Med surg, Attending Dr. Terisa Starr   History of Present Illness:  Zailah HEATHER MCKENDREE is a 66 y.o. female presenting after losing consciousness. [place] when patient started to lose consciousness, went to the bathroom and preceded to lose consciousness. She had diarrhea when she passed out. Family was with her when the event occurred.  Patient was diaphoretic and pale  when found by EMS.   Sitting on park on the bench and noticed her head going back.  Lost both bowel and bladder continence on herself and passed out.  Did not hit her head. Was sitting upright. Had LOC for 30 seconds. No rhythmic shaking of arms or legs. Was immediately back to her usual self. Has a  history of seizures, last seizure a couple months ago. Does not take any medicine for seizures. No chest pain or abdominal pain. No fevers.   Eating and drinking like usual.   Lives with cousin [name] and her mother Marjean Donna helps out as well.  EKG showed minor ST elevations. Cardiology ruled out MI.  In the ED, patient was given 1L bolus of NS. Troponins were found to be unremarkable. Lactic acid 1.4. Cr 1.39 from 0.85 at baseline.   Of note, patient was recently admitted for similar episode with shaking and foaming at mouth. Neurology was consulted at that time and EEG showed no evidence of seizure at that time.   Review Of Systems: Per HPI with the following additions:   Pertinent Past Medical History: Down Syndrome, Seizures (Per family, not on any medications), Legally blind, H/o multiple syncopal episodes  Remainder reviewed in history tab.   Pertinent Past Surgical History: None  Remainder reviewed in history tab.  Pertinent Social History: Tobacco use: None Alcohol use: drinks alcohol, a beer a year or so Other Substance use: None Lives with legal guardian, legal guardian sister and mother  Pertinent Family History: Asthma Cousin- seizures No heart problems  Remainder reviewed in history tab.   Important Outpatient Medications: None  Remainder reviewed in medication history.   Objective: BP 122/78   Pulse 60   Temp 98 F (36.7 C) (Oral)   Resp 16   LMP  (LMP Unknown) Comment: Mentaly challenged  SpO2 99%  Exam: General: Pleasant, intellectually disabled laying in bed  Eyes: L eye covered by cataract, R eye has cataract surrounding, EOMI, PERRLA  ENTM: Dry mucous membranes  Cardiovascular: RRR, II/VI systolic crescendo murmur, well perfused, no extremity edema  Respiratory: No increased work of breathing, clear to auscultation bilaterally  Gastrointestinal: Soft, non tender, non distended  Neuro: Able to follow commands, upper and lower extremity strength  5/5  Psych: Pleasant, difficult to converse with given speech impediment.   Labs:  CBC BMET  Recent Labs  Lab 03/12/22 1319  WBC 6.6  HGB 13.9  HCT 42.0  PLT 204   Recent Labs  Lab 03/12/22 1319  NA 140  K 4.2  CL 106  CO2 25  BUN 24*  CREATININE 1.39*  GLUCOSE 116*  CALCIUM 8.8*    Pertinent additional labs Troponin wnl, Lactic acid 1.4.  EKG: My own interpretation (not copied from electronic read) Normal Sinus, Normal rate, no acute findings     Imaging Studies Performed:  CXR Impression from Radiologist: Cardiomegaly and mild pulmonary vascular congestion without frank edema.   My Interpretation: mild cardiomegaly    Lockie Mola, MD 03/12/2022, 6:34 PM PGY-1, [facility]  FPTS Intern pager: [phone], text pages welcome Secure chat group [facility] Teaching Service

## 2022-03-12 NOTE — ED Notes (Signed)
Bladder scan shows 300mL 

## 2022-03-12 NOTE — ED Provider Notes (Signed)
Seen after prior EDP.  Case discussed with patient and patient's family at bedside.  Patient's family is interested in overnight admission for observation and additional rehydration.  Case discussed with family medicine teaching team.  They will evaluate for admission.    Wynetta Fines, MD 03/12/22 [phone]

## 2022-03-12 NOTE — Progress Notes (Signed)
FMTS Brief Progress Note  S:Patient is feeling much better than when she came in. Caregiver states she is doing better and that she is her normal self.    O: BP 116/67   Pulse (!) 52   Temp 98 F (36.7 C) (Oral)   Resp 17   LMP  (LMP Unknown) Comment: Mentaly challenged  SpO2 98%   General:  blind, NAD, supine in bed, conversing and interacting well CV: RRR  A/P: Syncope, presumed vasovagal vs heat exhaustion.  Improving, exam reassuring right now. CT head negative for acute findings, chronic findings are stable.  - Orders reviewed. Labs for AM ordered, which was adjusted as needed. Given normal troponin trend, discontinued further checks - If condition changes, plan includes further head imaging.   Evelena Leyden, DO 03/12/2022, 8:18 PM PGY-3, [facility] Night Resident  Please page [phone] with questions.

## 2022-03-12 NOTE — Assessment & Plan Note (Addendum)
H/o syncopal episodes. EEG at last admission unremarkable. Most likely due to heat exhaustion or dehydration in the setting of being [place], cannot rule out arrhythmia. Patient's cousin says that patient would not tolerate holter monitor. CT Head negative. Troponins wnl. EEG unremarkable. 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  - GOC conversation with family regarding ASD and pulm HTN  - Cardiology consulted, appreciate recs - Cardiac telemetry  - Orthostatic vital signs  -Defer PT, patient is ambulatory

## 2022-03-12 NOTE — Progress Notes (Signed)
Family medicine teaching service will be admitting this patient. Our pager information can be located in the physician sticky notes, treatment team sticky notes, and the headers of all our official daily progress notes.    FAMILY MEDICINE TEACHING SERVICE Patient - Please contact intern pager [phone] or text page via website [email or web address] (login: [email or web address]) for questions regarding care. DO NOT page listed attending provider unless there is no answer from the number above.    Ahniyah Giancola, DO PGY-2, [facility] pager [phone]    

## 2022-03-12 NOTE — Assessment & Plan Note (Addendum)
Resolved. Cr on admission 1.39 up from baseline of 0.85. Most likely prerenal secondary due to dehydration in the setting of being outside in the heat. Urinating normally.  - D/C IVF, taking in adequate po  - Avoid nephrotoxic medications

## 2022-03-12 NOTE — ED Notes (Signed)
Pt stands to use bedside toilet. No complaints of dizziness- steady with minimal guidance. Has large bowel movement, solid.

## 2022-03-12 NOTE — Hospital Course (Signed)
Yvonne Tyler is a 66 y.o. female who presented after a syncopal episode.  Her past medical history is significant for Down syndrome, legal blindness, history of seizure-like activity, history of syncopal episodes.  Her hospital course as outlined by the problems listed below.  Syncopal episode Patient [place]  Her EKG was unremarkable, troponins were normal***.  CT head showed***.  Patient received 1 L bolus of normal saline in the ED, and was continued to be hydrated with maintenance fluids.  She was observed with cardiac telemetry.  Neurology was consulted on 7/17 to evaluate for seizures given loss of bowel and bladder.***  AKI Creatinine on admission was 1.39 up from baseline of 0.85.  Patient was hydrated with maintenance fluids during admission and avoided nephrotoxic medications.  Creatinine on day of discharge was***.  Items for follow-up 1.

## 2022-03-13 ENCOUNTER — Encounter (HOSPITAL_COMMUNITY): Payer: Self-pay | Admitting: Family Medicine

## 2022-03-13 ENCOUNTER — Observation Stay (HOSPITAL_BASED_OUTPATIENT_CLINIC_OR_DEPARTMENT_OTHER): Payer: Medicare Other

## 2022-03-13 ENCOUNTER — Observation Stay (HOSPITAL_COMMUNITY): Payer: Medicare Other

## 2022-03-13 DIAGNOSIS — R55 Syncope and collapse: Secondary | ICD-10-CM | POA: Diagnosis not present

## 2022-03-13 DIAGNOSIS — N179 Acute kidney failure, unspecified: Secondary | ICD-10-CM

## 2022-03-13 DIAGNOSIS — H548 Legal blindness, as defined in USA: Secondary | ICD-10-CM

## 2022-03-13 DIAGNOSIS — Q909 Down syndrome, unspecified: Secondary | ICD-10-CM

## 2022-03-13 DIAGNOSIS — R569 Unspecified convulsions: Secondary | ICD-10-CM | POA: Diagnosis not present

## 2022-03-13 DIAGNOSIS — I959 Hypotension, unspecified: Secondary | ICD-10-CM

## 2022-03-13 LAB — BASIC METABOLIC PANEL
Anion gap: 5 (ref 5–15)
BUN: 14 mg/dL (ref 8–23)
CO2: 23 mmol/L (ref 22–32)
Calcium: 8.4 mg/dL — ABNORMAL LOW (ref 8.9–10.3)
Chloride: 111 mmol/L (ref 98–111)
Creatinine, Ser: 0.98 mg/dL (ref 0.44–1.00)
GFR, Estimated: >60 mL/min (ref >60)
Glucose, Bld: 87 mg/dL (ref 70–99)
Potassium: 3.7 mmol/L (ref 3.5–5.1)
Sodium: 139 mmol/L (ref 135–145)

## 2022-03-13 LAB — CBC
HCT: 37 % (ref 36.0–46.0)
Hemoglobin: 12.4 g/dL (ref 12.0–15.0)
MCH: 36.7 pg — ABNORMAL HIGH (ref 26.0–34.0)
MCHC: 33.5 g/dL (ref 30.0–36.0)
MCV: 109.5 fL — ABNORMAL HIGH (ref 80.0–100.0)
Platelets: 187 10*3/uL (ref 150–400)
RBC: 3.38 MIL/uL — ABNORMAL LOW (ref 3.87–5.11)
RDW: 12.7 % (ref 11.5–15.5)
WBC: 4.7 10*3/uL (ref 4.0–10.5)
nRBC: 0 % (ref 0.0–0.2)

## 2022-03-13 LAB — ECHOCARDIOGRAM COMPLETE: Area-P 1/2: 2.48 cm2

## 2022-03-13 MED ORDER — ORAL CARE MOUTH RINSE: 15.0000 mL | OROMUCOSAL | Status: DC | PRN

## 2022-03-13 NOTE — ED Notes (Signed)
Per MD, Pt will be discharged after orthostatics and echo results.

## 2022-03-13 NOTE — ED Notes (Signed)
ED TO INPATIENT HANDOFF REPORT  ED Nurse Name and Phone #: Fredric Mare [phone]  S Name/Age/Gender Yvonne Tyler 66 y.o. female Room/Bed: 036C/036C  Code Status   Code Status: DNR  Home/SNF/Other Home Patient oriented to: difficulty assess d/t decreased understanding, able to answer yes/no questions appropriately, interacts appropriately.  Is this baseline? Yes -   Triage Complete: Triage complete  Chief Complaint Syncope [R55]  Triage Note [place] with [place] and patient had several syncopal events. Denies chest pain. Down Syndrome and Seizures is only history.  Pt had no radials, diaphoretic, and pale on arrival by EMS. Pt has had NS 300cc bolus en route to ED. Family is on the way. Pt states she drinks etoh, but not sure frequency.    Allergies No Known Allergies  Level of Care/Admitting Diagnosis ED Disposition     ED Disposition  Admit   Condition  --   Comment  Hospital Area: [facility] [[id number]]  Level of Care: Telemetry Medical [104]  May place patient in observation at [facility] or [facility] if equivalent level of care is available:: No  Covid Evaluation: Asymptomatic - no recent exposure (last 10 days) testing not required  Diagnosis: Syncope [206001]  Admitting Physician: Lockie Mola [[id number]]  Attending Physician: Westley Chandler [[id number]]          B Medical/Surgery History Past Medical History:  Diagnosis Date   Blindness    Dizziness 05/06/2019   Down syndrome    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   Vitamin B12 deficiency 01/16/2020   History reviewed. No pertinent surgical history.   A IV Location/Drains/Wounds Patient Lines/Drains/Airways Status     Active  Line/Drains/Airways     Name Placement date Placement time Site Days   Peripheral IV 03/12/22 18 G Left Antecubital 03/12/22  --  Antecubital  1            Intake/Output Last 24 hours  Intake/Output Summary (Last 24 hours) at 03/13/2022 1935 Last data filed at 03/13/2022 1134 Gross per 24 hour  Intake 1753 ml  Output --  Net 1753 ml    Labs/Imaging Results for orders placed or performed during the hospital encounter of 03/12/22 (from the past 48 hour(s))  Lactic acid, plasma     Status: None   Collection Time: 03/12/22  1:19 PM  Result Value Ref Range   Lactic Acid, Venous 1.4 0.5 - 1.9 mmol/L    Comment: Performed at [facility], [address], [place], [place] [address]  Troponin I (High Sensitivity)     Status: None   Collection Time: 03/12/22  1:19 PM  Result Value Ref Range   Troponin I (High Sensitivity) 5 <18 ng/L    Comment: (NOTE) Elevated high sensitivity troponin I (hsTnI) values and significant  changes across serial measurements may suggest ACS but many other  chronic and acute conditions are known to elevate hsTnI results.  Refer to the "Links" section for chest pain algorithms and additional  guidance. Performed at [facility], [address], [place], [place] [address]   CBC     Status: Abnormal   Collection Time: 03/12/22  1:19 PM  Result Value Ref Range   WBC  6.6 4.0 - 10.5 K/uL   RBC 3.80 (L) 3.87 - 5.11 MIL/uL   Hemoglobin 13.9 12.0 - 15.0 g/dL   HCT 83.6 62.9 - 47.6 %   MCV 110.5 (H) 80.0 - 100.0 fL   MCH 36.6 (H) 26.0 - 34.0 pg   MCHC 33.1 30.0 - 36.0 g/dL   RDW 54.6 50.3 - 54.6 %   Platelets 204 150 - 400 K/uL   nRBC 0.0 0.0 - 0.2 %    Comment: Performed at [facility], [address], [place], [place] [address]  Comprehensive metabolic panel     Status: Abnormal   Collection Time: 03/12/22  1:19 PM  Result Value Ref Range   Sodium 140 135 - 145 mmol/L   Potassium 4.2 3.5 - 5.1 mmol/L   Chloride 106 98 - 111 mmol/L    CO2 25 22 - 32 mmol/L   Glucose, Bld 116 (H) 70 - 99 mg/dL    Comment: Glucose reference range applies only to samples taken after fasting for at least 8 hours.   BUN 24 (H) 8 - 23 mg/dL   Creatinine, Ser 7.51 (H) 0.44 - 1.00 mg/dL   Calcium 8.8 (L) 8.9 - 10.3 mg/dL   Total Protein 6.9 6.5 - 8.1 g/dL   Albumin 3.5 3.5 - 5.0 g/dL   AST 19 15 - 41 U/L   ALT 11 0 - 44 U/L   Alkaline Phosphatase 54 38 - 126 U/L   Total Bilirubin 1.1 0.3 - 1.2 mg/dL   GFR, Estimated 42 (L) >60 mL/min    Comment: (NOTE) Calculated using the CKD-EPI Creatinine Equation (2021)    Anion gap 9 5 - 15    Comment: Performed at [facility], [address] [place], [place] [address]  Urinalysis, Routine w reflex microscopic Urine, Clean Catch     Status: Abnormal   Collection Time: 03/12/22  1:19 PM  Result Value Ref Range   Color, Urine STRAW (A) YELLOW   APPearance CLEAR CLEAR   Specific Gravity, Urine 1.003 (L) 1.005 - 1.030   pH 5.0 5.0 - 8.0   Glucose, UA NEGATIVE NEGATIVE mg/dL   Hgb urine dipstick NEGATIVE NEGATIVE   Bilirubin Urine NEGATIVE NEGATIVE   Ketones, ur NEGATIVE NEGATIVE mg/dL   Protein, ur NEGATIVE NEGATIVE mg/dL   Nitrite NEGATIVE NEGATIVE   Leukocytes,Ua NEGATIVE NEGATIVE    Comment: Performed at [facility], [address], [place], [place] [address]  Lipase, blood     Status: None   Collection Time: 03/12/22  1:19 PM  Result Value Ref Range   Lipase 28 11 - 51 U/L    Comment: Performed at [facility], [address], [place], [place] [address]  Ethanol     Status: None   Collection Time: 03/12/22  1:19 PM  Result Value Ref Range   Alcohol, Ethyl (B) <10 <10 mg/dL    Comment: (NOTE) Lowest detectable limit for serum alcohol is 10 mg/dL.  For medical purposes only. Performed at [facility], [address] [place], [place] [address]   Blood culture (routine x 2)     Status: None (Preliminary result)   Collection Time: 03/12/22  2:26 PM    Specimen: BLOOD  Result Value Ref Range   Specimen Description BLOOD RIGHT ANTECUBITAL    Special Requests      BOTTLES DRAWN AEROBIC AND ANAEROBIC Blood Culture adequate volume   Culture  NO GROWTH < 24 HOURS Performed at [facility], [address] [place], [place] [address]    Report Status PENDING   Blood culture (routine x 2)     Status: None (Preliminary result)   Collection Time: 03/12/22  2:48 PM   Specimen: BLOOD  Result Value Ref Range   Specimen Description BLOOD RIGHT ANTECUBITAL    Special Requests      BOTTLES DRAWN AEROBIC AND ANAEROBIC Blood Culture adequate volume   Culture      NO GROWTH < 24 HOURS Performed at [facility], [address] [place], [place] [address]    Report Status PENDING   Lactic acid, plasma     Status: None   Collection Time: 03/12/22  3:33 PM  Result Value Ref Range   Lactic Acid, Venous 1.3 0.5 - 1.9 mmol/L    Comment: Performed at [facility], [address] [place], [place] [address]  Troponin I (High Sensitivity)     Status: None   Collection Time: 03/12/22  3:33 PM  Result Value Ref Range   Troponin I (High Sensitivity) 6 <18 ng/L    Comment: (NOTE) Elevated high sensitivity troponin I (hsTnI) values and significant  changes across serial measurements may suggest ACS but many other  chronic and acute conditions are known to elevate hsTnI results.  Refer to the "Links" section for chest pain algorithms and additional  guidance. Performed at [facility], [address] [place], [place] [address]   HIV Antibody (routine testing w rflx)     Status: None   Collection Time: 03/12/22  7:50 PM  Result Value Ref Range   HIV Screen 4th Generation wRfx Non Reactive Non Reactive    Comment: Performed at [facility], [address] [place], [place] [address]  Troponin I (High Sensitivity)     Status: None   Collection Time: 03/12/22  7:50 PM  Result Value Ref Range   Troponin I (High  Sensitivity) 4 <18 ng/L    Comment: (NOTE) Elevated high sensitivity troponin I (hsTnI) values and significant  changes across serial measurements may suggest ACS but many other  chronic and acute conditions are known to elevate hsTnI results.  Refer to the "Links" section for chest pain algorithms and additional  guidance. Performed at [facility], [address] [place], [place] [address]   Basic metabolic panel     Status: Abnormal   Collection Time: 03/13/22  6:32 AM  Result Value Ref Range   Sodium 139 135 - 145 mmol/L   Potassium 3.7 3.5 - 5.1 mmol/L   Chloride 111 98 - 111 mmol/L   CO2 23 22 - 32 mmol/L   Glucose, Bld 87 70 - 99 mg/dL    Comment: Glucose reference range applies only to samples taken after fasting for at least 8 hours.   BUN 14 8 - 23 mg/dL   Creatinine, Ser 9.620.98 0.44 - 1.00 mg/dL   Calcium 8.4 (L) 8.9 - 10.3 mg/dL   GFR, Estimated >95>60 >28>60 mL/min    Comment: (NOTE) Calculated using the CKD-EPI Creatinine Equation (2021)    Anion gap 5 5 - 15    Comment: Performed at [facility], [address] [place], [place] [address]  CBC     Status: Abnormal   Collection Time: 03/13/22  6:32 AM  Result Value Ref Range   WBC 4.7 4.0 - 10.5 K/uL   RBC  3.38 (L) 3.87 - 5.11 MIL/uL   Hemoglobin 12.4 12.0 - 15.0 g/dL   HCT 49.7 02.6 - 37.8 %   MCV 109.5 (H) 80.0 - 100.0 fL   MCH 36.7 (H) 26.0 - 34.0 pg   MCHC 33.5 30.0 - 36.0 g/dL   RDW 58.8 50.2 - 77.4 %   Platelets 187 150 - 400 K/uL   nRBC 0.0 0.0 - 0.2 %    Comment: Performed at [facility], [address], [place], [place] [address]   ECHOCARDIOGRAM COMPLETE  Result Date: 03/13/2022    ECHOCARDIOGRAM REPORT   Patient Name:   Yvonne Tyler Date of Exam: 03/13/2022 Medical Rec #:  [id number]        Height:       60.0 in Accession #:    [id number]       Weight:       110.0 lb Date of Birth:  09-07-1955        BSA:          1.448 m Patient Age:    66 years         BP:           121/62 mmHg  Patient Gender: F                HR:           63 bpm. Exam Location:  Inpatient Procedure: 2D Echo, Cardiac Doppler and Color Doppler Indications:    Syncope R55  History:        Patient has prior history of Echocardiogram examinations, most                 recent 07/04/2011. Signs/Symptoms:Syncope.  Sonographer:    Lucendia Herrlich Sonographer#2:  Melissa Morford RDCS (AE, PE) Referring Phys: [id number] CARINA M BROWN IMPRESSIONS  1. Left ventricular ejection fraction, by estimation, is 55 to 60%. The left ventricle has normal function. The left ventricle has no regional wall motion abnormalities. There is mild left ventricular hypertrophy. Left ventricular diastolic parameters are indeterminate.  2. Right ventricular systolic function is normal. The right ventricular size is moderately enlarged.  3. On image 79, concern for significant left to right shunt by color doppler, concerning for ASD. Evidence of atrial level shunting detected by color flow Doppler. 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  4. Right atrial size was moderately dilated.  5. A small pericardial effusion is present. The pericardial effusion is circumferential. There is no evidence of cardiac tamponade.  6. The mitral valve is normal in structure. Trivial mitral valve regurgitation. No evidence of mitral stenosis.  7. The aortic valve is tricuspid. Aortic valve regurgitation is not visualized. No aortic stenosis is present.  8. The inferior vena cava is normal in size with greater than 50% respiratory variability, suggesting right atrial pressure of 3 mmHg. Comparison(s): Prior images unable to be directly viewed, comparison made by report only. Changes from prior study are noted. Conclusion(s)/Recommendation(s): Moderate pericardial effusion without tamponade. Moderate to severely dilated RV (appears larger than LV in several views). TR inadequate to assess right sided  pressures. 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 Findings reported to Dr. Manson Passey and family medicine teaching service. FINDINGS  Left Ventricle: Left ventricular ejection fraction, by estimation, is 55 to 60%. The left ventricle has normal function. The left ventricle has no regional wall motion abnormalities. The left ventricular internal cavity size  was small. There is mild left ventricular hypertrophy. Left ventricular diastolic parameters are indeterminate. Right Ventricle: The right ventricular size is moderately enlarged. Right vetricular wall thickness was not well visualized. Right ventricular systolic function is normal. Left Atrium: Left atrial size was normal in size. Right Atrium: Right atrial size was moderately dilated. Pericardium: A small pericardial effusion is present. The pericardial effusion is circumferential. There is no evidence of cardiac tamponade. Mitral Valve: The mitral valve is normal in structure. Trivial mitral valve regurgitation. No evidence of mitral valve stenosis. Tricuspid Valve: The tricuspid valve is normal in structure. Tricuspid valve regurgitation is trivial. No evidence of tricuspid stenosis. Aortic Valve: The aortic valve is tricuspid. Aortic valve regurgitation is not visualized. No aortic stenosis is present. Pulmonic Valve: The pulmonic valve was grossly normal. Pulmonic valve regurgitation is trivial. No evidence of pulmonic stenosis. Aorta: The aortic root, ascending aorta and aortic arch are all structurally normal, with no evidence of dilitation or obstruction. Venous: The inferior vena cava is normal in size with greater than 50% respiratory variability, suggesting right atrial pressure of 3 mmHg. IAS/Shunts: Evidence of atrial level shunting detected by color flow Doppler. 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  LEFT VENTRICLE PLAX 2D LVIDd:         2.80 cm   Diastology LV PW:         1.20 cm    LV e' medial:    6.64 cm/s LV IVS:        1.30 cm   LV E/e' medial:  12.0 LVOT diam:     1.70 cm   LV e' lateral:   10.60 cm/s LV SV:         47        LV E/e' lateral: 7.5 LV SV Index:   32 LVOT Area:     2.27 cm  RIGHT VENTRICLE RV Basal diam:  4.20 cm RV S prime:     14.60 cm/s TAPSE (M-mode): 3.1 cm LEFT ATRIUM             Index        RIGHT ATRIUM           Index LA diam:        2.90 cm 2.00 cm/m   RA Area:     15.80 cm LA Vol (A2C):   66.9 ml 46.21 ml/m  RA Volume:   44.10 ml  30.46 ml/m LA Vol (A4C):   16.6 ml 11.47 ml/m LA Biplane Vol: 36.3 ml 25.07 ml/m  AORTIC VALVE LVOT Vmax:   94.00 cm/s LVOT Vmean:  69.300 cm/s LVOT VTI:    0.207 m  AORTA Ao Root diam: 3.00 cm Ao Asc diam:  2.50 cm MITRAL VALVE MV Area (PHT): 2.48 cm    SHUNTS MV Decel Time: 306 msec    Systemic VTI:  0.21 m MV E velocity: 79.70 cm/s  Systemic Diam: 1.70 cm MV A velocity: 96.40 cm/s MV E/A ratio:  0.83 Jodelle Red MD Electronically signed by Jodelle Red MD Signature Date/Time: 03/13/2022/5:27:25 PM    Final    EEG adult  Result Date: 03/13/2022 Charlsie Quest, MD     03/13/2022  1:54 PM Patient Name: Yvonne Tyler MRN: [id number] Epilepsy Attending: Charlsie Quest Referring Physician/Provider: Fayette Pho, MD Date: 03/13/2022 Duration: 22.24 mins Patient history: 66yo F with syncope. EEG to evaluate for seizure Level of alertness: Awake AEDs  during EEG study: None Technical aspects: This EEG study was done with scalp electrodes positioned according to the 10-20 International system of electrode placement. Electrical activity was acquired at a sampling rate of 500Hz  and reviewed with a high frequency filter of 70Hz  and a low frequency filter of 1Hz . EEG data were recorded continuously and digitally stored. Description: The posterior dominant rhythm consists of 8 Hz activity of moderate voltage (25-35 uV) seen predominantly in posterior head regions, symmetric and reactive to eye opening and eye  closing. Hyperventilation and photic stimulation were not performed.   IMPRESSION: This study is within normal limits. No seizures or epileptiform discharges were seen throughout the recording. Priyanka   CT HEAD WO CONTRAST ( )  Result Date: 03/12/2022 CLINICAL DATA:  Syncope/presyncope, cerebrovascular cause suspected EXAM: CT HEAD WITHOUT CONTRAST TECHNIQUE: Contiguous axial images were obtained from the base of the skull through the vertex without intravenous contrast. RADIATION DOSE REDUCTION: This exam was performed according to the departmental dose-optimization program which includes automated exposure control, adjustment of the mA and/or kV according to patient size and/or use of iterative reconstruction technique. COMPARISON:  08/28/2021 FINDINGS: Brain: Scratch no intracranial hemorrhage, mass effect, or midline shift. Stable degree of atrophy. No hydrocephalus. Again seen normal variant cavum septum pellucidum. The basilar cisterns are patent. Similar periventricular chronic small vessel ischemia. No evidence of territorial infarct or acute ischemia. No extra-axial or intracranial fluid collection. Vascular: No hyperdense vessel or unexpected calcification. Skull: No fracture or focal lesion. Sinuses/Orbits: No acute findings. Postsurgical change of the globes. Suspect remote right medial orbital wall fracture. Other: None. IMPRESSION: 1. No acute intracranial abnormality. 2. Stable atrophy and chronic small vessel ischemia. Electronically Signed   By: M.D.   On: 03/12/2022 19:16   DG Chest Port 1 View  Result Date: 03/12/2022 CLINICAL DATA:  Weakness.  Loss of consciousness. EXAM: PORTABLE CHEST 1 VIEW COMPARISON:  One view chest x-ray 08/28/2021 FINDINGS: Heart is enlarged. Mild pulmonary vascular congestion is present without frank edema. No focal airspace consolidation is present. No effusions are present. Visualized soft tissues and bony thorax are unremarkable.  IMPRESSION: Cardiomegaly and mild pulmonary vascular congestion without frank edema. Electronically Signed   By: 03/14/2022 M.D.   On: 03/12/2022 14:09    Pending Labs Unresulted Labs (From admission, onward)    None       Vitals/Pain Today's Vitals   03/13/22 1500 03/13/22 1530 03/13/22 1858 03/13/22 1933  BP: 99/65 100/76  119/77  Pulse: 64 76  62  Resp: 19 (!) 24  20  Temp:   98.1 F (36.7 C) 98.2 F (36.8 C)  TempSrc:   Oral Oral  SpO2: 97% 93%  97%  PainSc:        Isolation Precautions No active isolations  Medications Medications  enoxaparin (LOVENOX) injection 40 mg (40 mg Subcutaneous Given 03/13/22 0907)  lactated ringers bolus 1,000 mL (0 mLs Intravenous Stopped 03/12/22 1601)    Mobility walks with person assist Low fall risk   Focused Assessments Cardiac Assessment Handoff:    Lab Results  Component Value Date   TROPONINI <0.30 06/25/2013   No results found for: "DDIMER" Does the Patient currently have chest pain? No    R Recommendations: See Admitting Provider Note  Report given to:   Additional Notes: -

## 2022-03-13 NOTE — ED Notes (Signed)
Pt ambulatory to bathroom

## 2022-03-13 NOTE — Progress Notes (Addendum)
     Daily Progress Note Intern Pager: [phone]  Patient name: Yvonne Tyler Medical record number: [id number] Date of birth: 1956/03/18 Age: 66 y.o. Gender: female  Primary Care Provider: Alfredo Martinez, MD Consultants: None Code Status: DNR  Pt Overview and Major Events to Date:  7/16 - admitted   Assessment and Plan: Yvonne Tyler is a 66 y.o. female presenting after bhaving a syncopal episode. Differential for this patient's presentation of this includes cardiac cause (vasovagal, arhythmia), neurologic cause such as TIA, electrolyte abnormalities, hypoglycemia, heat exhaustion, infection.  * Syncope H/o syncopal episodes. EEG at last admission unremarkable. Most likely due to heat exhaustion or dehydration in the [place], cannot rule out arrhythmia. Patient's cousin says that patient would not tolerate holter monitor. CT Head negative. Troponins wnl.  - Cardiac telemetry  - F/u ECHO - EEG today  - Orthostatic vital signs  -Defer PT, patient is ambulatory   AKI (acute kidney injury) (HCC) Reolving, Cr today 0.98. Cr on admission 1.39 up from baseline of 0.85. Most likely prerenal secondary due to dehydration in the setting of being outside in the heat. Urinating normally.  - D/C IVF, taking in adequate po  - Avoid nephrotoxic medications   FEN/GI: Regular PPx: Lovenox Dispo:Home pending clinical improvement . Barriers include completing clinical evaluation.   Subjective:  Patient says she is doing well this morning. Caregiver (cousin) states patient is at her normal baseline and has been urinating normally. She denies chest pain, shortness of breath.   Objective: Temp:  [98 F (36.7 C)-98.4 F (36.9 C)] 98.4 F (36.9 C) (07/17 0645) Pulse Rate:  [52-90] 58 (07/17 0800) Resp:  [12-22] 17 (07/17 0800) BP: (100-159)/(44-120) 121/62 (07/17 0800) SpO2:  [95 %-100 %] 98 % (07/17 0800) Physical Exam: General: Blind, well appearing,  loquacious, pleasant  Cardiovascular: RRR, systolic murmur II/VI, well perfused, BLE non edematous, cap refill <2 seconds  Respiratory: Normal work of breathing on room air  Abdomen: Soft, non tender, non distended   Laboratory: Most recent CBC Lab Results  Component Value Date   WBC 4.7 03/13/2022   HGB 12.4 03/13/2022   HCT 37.0 03/13/2022   MCV 109.5 (H) 03/13/2022   PLT 187 03/13/2022   Most recent BMP    Latest Ref Rng & Units 03/13/2022    6:32 AM  BMP  Glucose 70 - 99 mg/dL 87   BUN 8 - 23 mg/dL 14   Creatinine 0.27 - 1.00 mg/dL 7.41   Sodium 287 - 867 mmol/L 139   Potassium 3.5 - 5.1 mmol/L 3.7   Chloride 98 - 111 mmol/L 111   CO2 22 - 32 mmol/L 23   Calcium 8.9 - 10.3 mg/dL 8.4      Lockie Mola, MD 03/13/2022, 10:28 AM  PGY-1, [place] [facility] FPTS Intern pager: [phone], text pages welcome Secure chat group [facility] Teaching Service

## 2022-03-13 NOTE — ED Notes (Signed)
Temperature will be obtained upon patient waking up

## 2022-03-13 NOTE — ED Notes (Signed)
Cardiology at bedside.

## 2022-03-13 NOTE — ED Notes (Signed)
Updated family member [name] on pts admission and bed assignment to room 5N20

## 2022-03-13 NOTE — ED Notes (Signed)
Pt urinated in BSC.  

## 2022-03-13 NOTE — Discharge Instructions (Addendum)
 Dear Yvonne Tyler,   Thank you for letting us  participate in your care! You were admitted for syncope (passing out). Your tests showed you had some dehydration. Your CT head was normal. We treated you with fluids through an IV tube.  You were also seen by our cardiologists to discuss an abnormality seen on echocardiogram (which is an ultrasound of the heart) that saw an ASD (atrial septal defect). They will follow you outpatient.  POST-HOSPITAL & CARE INSTRUCTIONS Follow-up with your PCP in 2-3 days. Please go to your follow-up appointment with the cardiologists. Please let PCP/Specialists know of any changes in medications that were made.  Please see medications section of this packet for any medication changes.   DOCTOR'S APPOINTMENTS & FOLLOW UP  Follow-up Information     [facility]. Go on 03/17/2022.   Specialty: Family Medicine Why: At 10:30 am.  Please arrive by 10:15 AM.  This is a hospital follow-up at the [facility].  Your primary care doctor, Dr. Bryan, is not available at this time, so will see one of her other providers.  They will talk to you about Contact information: [address] [phone] [name]  [phone]                   Thank you for choosing [facility]! Take care and be well!  Family Medicine Teaching Service Inpatient Team [place]  [facility]  [address] [place], [place] [address] [phone]

## 2022-03-13 NOTE — ED Notes (Signed)
RN updated Yvonne Tyler

## 2022-03-13 NOTE — Care Management Obs Status (Signed)
MEDICARE OBSERVATION STATUS NOTIFICATION   Patient Details  Name: Yvonne Tyler MRN: [id number] Date of Birth: [birth year]   Medicare Observation Status Notification Given:  Waylan Boga, RN 03/13/2022, 8:50 PM

## 2022-03-13 NOTE — ED Notes (Signed)
Guardian/cousin [name] [phone] would like an update immediately

## 2022-03-13 NOTE — Progress Notes (Signed)
Spoke to the patient in ED this evening but she is a poor historian.   Hemodynamically stable.   Was informed by primary team that contact person is cousin Ailene and phone number per EMR is [phone]. This number is not working. Will reach out to her tomorrow.   Primary team updated.   No charge.   Tessa Lerner, [place], [facility]  Pager: [phone] Office: [phone]

## 2022-03-13 NOTE — Procedures (Signed)
Patient Name: Yvonne Tyler  MRN: [id number]  Epilepsy Attending: Charlsie Quest  Referring Physician/Provider: Fayette Pho, MD  Date: 03/13/2022 Duration: 22.24 mins  Patient history: 66yo F with syncope. EEG to evaluate for seizure  Level of alertness: Awake  AEDs during EEG study: None  Technical aspects: This EEG study was done with scalp electrodes positioned according to the 10-20 International system of electrode placement. Electrical activity was acquired at a sampling rate of 500Hz  and reviewed with a high frequency filter of 70Hz  and a low frequency filter of 1Hz . EEG data were recorded continuously and digitally stored.   Description: The posterior dominant rhythm consists of 8 Hz activity of moderate voltage (25-35 uV) seen predominantly in posterior head regions, symmetric and reactive to eye opening and eye closing. Hyperventilation and photic stimulation were not performed.     IMPRESSION: This study is within normal limits. No seizures or epileptiform discharges were seen throughout the recording.  Aniket Paye 

## 2022-03-13 NOTE — ED Notes (Signed)
During orthostatic vitals the patient stated that she had to go to the bathroom and could not wait. The patient tolerated standing and walking short distance to the bedside commode.

## 2022-03-13 NOTE — Consult Note (Signed)
In Error.  See Consult Note by Dr. Jacinto Halim.   Tessa Lerner, [place], [facility]  Pager: [phone] Office: [phone]

## 2022-03-13 NOTE — Progress Notes (Signed)
EEG complete - results pending 

## 2022-03-14 DIAGNOSIS — R55 Syncope and collapse: Secondary | ICD-10-CM | POA: Diagnosis not present

## 2022-03-14 DIAGNOSIS — Q211 Atrial septal defect, unspecified: Secondary | ICD-10-CM | POA: Diagnosis not present

## 2022-03-14 DIAGNOSIS — Q909 Down syndrome, unspecified: Secondary | ICD-10-CM | POA: Diagnosis not present

## 2022-03-14 DIAGNOSIS — H548 Legal blindness, as defined in USA: Secondary | ICD-10-CM | POA: Diagnosis not present

## 2022-03-14 DIAGNOSIS — Z7189 Other specified counseling: Secondary | ICD-10-CM | POA: Diagnosis not present

## 2022-03-14 LAB — GLUCOSE, CAPILLARY
Glucose-Capillary: 76 mg/dL (ref 70–99)
Glucose-Capillary: 137 mg/dL — ABNORMAL HIGH (ref 70–99)

## 2022-03-14 MED ORDER — RAMELTEON 8 MG PO TABS
8.0000 mg | ORAL_TABLET | Freq: Every day | ORAL | Status: DC
  Administered 2022-03-14: 8 mg via ORAL
  Filled 2022-03-14 (×2): qty 1

## 2022-03-14 MED ORDER — RAMELTEON 8 MG PO TABS: 8.0000 mg | ORAL_TABLET | Freq: Every evening | ORAL | Status: DC | PRN

## 2022-03-14 MED ORDER — MELATONIN 3 MG PO TABS
3.0000 mg | ORAL_TABLET | Freq: Once | ORAL | Status: DC
Start: 2022-03-14 — End: 2022-03-15

## 2022-03-14 NOTE — Consult Note (Signed)
CARDIOLOGY CONSULT NOTE  Patient ID: Yvonne Tyler MRN: [id number] DOB/AGE: 66/08/1955 66 y.o.  Admit date: 03/12/2022 Referring Physician  Terisa Starr, MD Primary Physician:  Alfredo Martinez, MD Reason for Consultation  ASD and Syncope  Patient ID: Yvonne Tyler, female    DOB: 09/04/1955, 66 y.o.   MRN: [id number]  Chief Complaint  Patient presents with   Loss of Consciousness   Hypotension   HPI:    Yvonne Tyler  is a 66 y.o. 66 year old female patient with Down syndrome, legally blind, [place] and also lost bowel and bladder control.  Initially admitted with probable neurocardiogenic syncope or vasovagal syncope.  Work-up revealed a moderate to large sized atrial septal defect.  I was consulted to opine regarding management of the same and evaluate her for syncope.  She lives with her aunt [name] who is also the legal guardian.  I was able to get in touch with Ms. West Bali and 2 of her son's who are also on the phone.  Patient was sitting in the park when she passed out and defecated and also urinated on herself.  She has had similar episode about 2 months ago or so while she was sitting at home.  She has had at least 1-2 more episodes prior to that, overall every 1 to 2 months she has been having 1 episode of syncope and seizure episodes.  Patient is able to go to the bathroom independently, but needs help for activities of daily living.  She is able to feed herself with moderate amount of difficulty.  Patient is unable to give me any history and hence history is obtained by family members.  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   Vitamin B12 deficiency 01/16/2020   History reviewed. No pertinent surgical history. Social History   Tobacco Use   Smoking status: Never   Smokeless tobacco: Never  Substance Use Topics   Alcohol use: Yes    Comment: Beer once a year on her birthday.    Family History  Problem Relation Age of Onset   Colon cancer Neg Hx    Esophageal cancer Neg Hx    Rectal cancer Neg Hx    Prostate cancer Neg Hx     Marital Status: Single  ROS  Review of Systems  Unable to perform ROS: Other (Patient with mental retardation)   Objective      03/14/2022    2:25 PM 03/14/2022    9:15 AM 03/13/2022    8:55 PM  Vitals with BMI  Systolic 95 138 141  Diastolic 59 91 97  Pulse 65 62 62    Blood pressure (!) 95/59, pulse 65, temperature 98.4 F (36.9 C), resp. rate 15, SpO2 99 %.    Physical Exam Neck:     Vascular: No JVD.  Cardiovascular:     Rate and Rhythm: Normal rate and regular rhythm.     Pulses: Normal pulses and intact distal pulses.     Heart sounds: Murmur heard.     Harsh mid to late systolic murmur is present at the upper left sternal border.  No gallop.     Comments: Parasternal heave present Pulmonary:     Effort: Pulmonary effort is normal.     Breath sounds: Normal breath sounds.  Abdominal:     General: Bowel sounds are normal.     Palpations: Abdomen is soft.  Musculoskeletal:     Right lower leg: No edema.     Left lower leg: No edema.    Laboratory examination:   Recent Labs    08/31/21 0509 03/12/22 1319 03/13/22 0632  NA 137 140 139  K 3.5 4.2 3.7  CL 105 106 111  CO2 22 25 23   GLUCOSE 108* 116* 87  BUN 8 24* 14  CREATININE 0.83 1.39* 0.98  CALCIUM 8.1* 8.8* 8.4*  GFRNONAA >60 42* >60   CrCl cannot be calculated (Unknown ideal weight.).     Latest Ref Rng & Units 03/13/2022    6:32 AM 03/12/2022    1:19 PM 08/31/2021    5:09 AM  CMP  Glucose 70 - 99 mg/dL 87  10/29/2021  315   BUN 8 - 23 mg/dL 14  24  8    Creatinine  0.44 - 1.00 mg/dL 400   8.67   Sodium 135 - 145 mmol/L 139  140  137   Potassium 3.5 - 5.1 mmol/L 3.7  4.2  3.5   Chloride 98 - 111 mmol/L 111  106  105   CO2 22 - 32 mmol/L 23  25  22    Calcium 8.9 - 10.3 mg/dL 8.4  8.8  8.1   Total Protein 6.5 - 8.1 g/dL  6.9    Total Bilirubin 0.3 - 1.2 mg/dL  1.1    Alkaline Phos 38 - 126 U/L  54    AST 15 - 41 U/L  19    ALT 0 - 44 U/L  11        Latest Ref Rng & Units 03/13/2022    6:32 AM 03/12/2022    1:19 PM 08/31/2021    5:09 AM  CBC  WBC 4.0 - 10.5 K/uL 4.7  6.6  9.9   Hemoglobin 12.0 - 15.0 g/dL 03/15/2022  03/14/2022  10/29/2021   Hematocrit 36.0 - 46.0 % 37.0  42.0  34.0   Platelets 150 - 400 K/uL 187  204  208    Cardiac Panel (last 3 results) Recent Labs    03/12/22 1319 03/12/22 1533 03/12/22 1950  TROPONINIHS 5 6 4      Medications and allergies  No Known Allergies   Scheduled Meds:  enoxaparin (LOVENOX) injection  40 mg Subcutaneous Q24H   melatonin  3 mg Oral Once   ramelteon  8 mg Oral QHS   Continuous Infusions: PRN Meds:.mouth rinse   I/O last 3 completed shifts: In: 1753 [I.V.:1753] Out: 750 [Urine:750] Total I/O In: 480 [P.O.:480] Out: -    Radiology:   CT angiogram chest 08/28/2021: 1. No pulmonary embolus. 2. Right upper lobe aspiration pneumonia. 3. Enlarged left and right main pulmonary arteries suggestive of pulmonary hypertension. 4. Enlarged right atrium. 5. Under distension of the ascending colon with query mild bowel thickening. Correlate clinically for colitis. 6.  No acute traumatic injury to the chest, abdomen, or pelvis. 7. No acute fracture or traumatic malalignment of the thoracic or lumbar spine.  EEG 03/13/2022: This study is within normal limits. No seizures or epileptiform discharges were seen throughout the recording.   CT scan of the head 03/12/2022: 1. No acute intracranial abnormality. 2. Stable atrophy and  chronic small vessel ischemia.  Cardiac Studies:   Echocardiogram  03/09/2022: Left ventricular ejection fraction, by estimation, is 55 to 60%. The left ventricle has normal function. The left ventricle has no regional wall motion abnormalities. There is mild left ventricular hypertrophy. Left ventricular diastolic parameters are indeterminate. Right ventricular systolic function is normal. The right ventricular size is moderately enlarged. 2. On image 79, concern for significant left to right shunt by color doppler, concerning for ASD. Evidence of atrial level shunting detected by color flow Doppler.  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 3. Right atrial size was moderately dilated. 4. A small pericardial effusion is present. The pericardial effusion is circumferential. There is no evidence of cardiac tamponade. 5. The inferior vena cava is normal in size with greater than 50% respiratory variability,suggesting right atrial pressure of 3 mmHg.  Conclusion(s)/Recommendation(s): Moderate pericardial effusion without tamponade. Moderate to severely dilated RV (appears larger than LV in several views). TR inadequate to assess right sided pressures. 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 Findings reported to Dr. Manson Passey and family medicine teaching service.   EKG:  EKG 03/12/2022: Normal sinus rhythm at the rate of 53 bpm, right atrial enlargement, right axis deviation, left posterior fascicular block.  No evidence of ischemia.  Compared to 08/28/2021, no significant change.  Previously RVH with R to S ratio in V1 was mobile evident.  Telemetry 03/14/2022: No significant arrhythmias, no heart block.  Assessment   1.  Secundum atrial septal defect without hemodynamic compromise, although right atrium and right ventricle are enlarged, CVP, is within normal limits and there is no right-sided heart failure by clinical exam as well.  Right ventricular strain is evident by parasternal heave and also EKG  changes of right axis deviation and P pulmonale. 2.  Recurrent episodes of syncope.  EEG negative for seizures. 3.  Down syndrome with significant mental retardation.  Recommendations:   Patient's presentation is not consistent with severe pulmonary hypertension leading to Eisenmenger syndrome and syncope.  There is no cyanosis, heart oxygenation is 99%, although there is right ventricular strain, I do not think eventually right-sided heart failure would be the cause of her death in a patient with Down syndrome.  In view of her mental retardation, I will treat her symptomatically.  As she does not describe any dyspnea, she has not had any TIA, no clinical evidence of heart failure, I will treat ASD as an incidental finding.  With regard to recurrent episodes of syncope, sudden onset of unexplained syncope while sitting points either to seizure disorder or cardiac arrhythmias.  Dehydration may have played a small role in present admission however previous admission and also feels at times with syncope occurring at home, patient was sitting once and suddenly passed out and the second time she was sitting and eating when she passed out.  However on discussions with family members, patient did not appear to have postictal confusion and kept saying "I am okay mama".  Since she has had recurrent syncope, option would be to consider loop recorder implantation. I have discussed extensively with patient's and [name] bedside who is also her legal guardian and also her 2 children regarding all the possibilities and also discussed regarding atrial septal defect.  They are aware that eventually this will lead to right-sided heart failure however in a patient with Down syndrome longevity beyond present age is unusual and in the absence of significant RV strain  as she is not physically active, that the patient would develop Eisenmenger's would be very low on the list.  Patient can be discharged from cardiac  standpoint, incidentally patient was at Ms. [name] is a patient at our practice as well.  She is currently discussed with her family members regarding possibility of loop recorder implantation.  I can set this up in the outpatient basis.  We will continue to follow her in the outpatient basis.  Thank you for the consultation.  This was a complex consult, I spent >60 minutes of my time in evaluating the patient's chart, discussions with family members and personally reviewing the images.  On 07/04/2011, patient had echocardiogram at that time she had mild to moderate TR and right atrium and right ventricular moderately dilated and ASD probably was missed.  Hence overall I feel that has been not a significant change on RV strain.   Yates Decamp, MD, [facility] 03/14/2022, 6:57 PM Office: [phone]

## 2022-03-14 NOTE — Progress Notes (Signed)
FMTS Brief Progress Note  S:Called to patient room as patient is becoming slightly agitated and continues to try and get out of bed. The patient is not redirectable by nursing. She does not become physically aggressive but is not responsive to other measures of redirecting and speech becomes more incomprehensible when more agitated/annoyed.  Patient is sitting up in bed, constantly adjusting her clothing and the bed sheets, getting up from the bed intermittently. Only intermittently responds to requests from providers in the room.   Per the chart, patient has a legal guardian who is not present at this time. Attempted to call the phone number x3 with no answer and a voicemail was left to call back and talk to the patient's nurse.   Nursing doesn't feel that a sitter would be of much assistance as the patient cannot really be redirected   O: BP (!) 141/97 (BP Location: Right Arm)   Pulse 62   Temp 97.9 F (36.6 C) (Oral)   Resp 18   LMP  (LMP Unknown) Comment: Mentaly challenged  SpO2 98%   General: sitting up on the bed, bed alarm going off, patient intermittently trying to get up from the bed. Overall well appearing and not in any acute distress. Psych/neuro: interacting with the items in the room appropriately, but not responding to most requests from provider and difficulty with understanding majority of patient's speech at this time.   A/P: Agitation, sundowning? In the setting of legal blindness and down syndrome. No signs of acute illness that would be causing worsening mentation and do not feel acute neurologic etiology. Most recent labs have been normal so unlikely electrolyte contribution or uremia.  - Continue redirecting as able - Requesting that family come stay with the patient  - Melatonin 3mg   - Consideration for Seroquel if increasing agitation, but prefer to not administer medications unless necessary  Kyrstal Monterrosa, DO 03/14/2022, 1:33 AM PGY-3, [place] [facility]  Med[facility] Night Resident  Please page [phone] with questions.

## 2022-03-14 NOTE — Progress Notes (Addendum)
Daily Progress Note Intern Pager: [phone]  Patient name: Yvonne Tyler Medical record number: [id number] Date of birth: 03/17/1956 Age: 66 y.o. Gender: female  Primary Care Provider: Alfredo Martinez, MD Consultants: Cardiology Code Status: DNR  Pt Overview and Major Events to Date:  7/16 - Admitted  7/17 - ECHO, EEG   Assessment and Plan: Yvonne Tyler is a 65 y.o. female presenting after having a syncopal episode. Most likely multifactorial secondary to heat exhaustion in the setting of pulmonary HTN and ASD found on ECHO.   * Atrial septal defect with recurrent syncope and concern for pulmonary HTN  H/o syncopal episodes. Dehydration in setting of ASD and PHTN (had markedly changed RV size)/  - GOC conversation with family regarding ASD and pulm HTN, amenable to palliative care  - Cardiology consulted, appreciate recs - Cardiac telemetry  - Orthostatic vital signs  -Defer PT, patient is ambulatory    Agitation at night, suspect delirium due to impaired vision etc.  - Delirium precautions - family at bedside - Ramelteon at night   AKI (acute kidney injury) (HCC)-resolved as of 03/14/2022 Resolved. Cr on admission 1.39 up from baseline of 0.85. Most likely prerenal secondary due to dehydration in the setting of being outside in the heat. Urinating normally.  - D/C IVF, taking in adequate po  - Avoid nephrotoxic medications    FEN/GI: Regular  PPx: Lovenox  Dispo: Home pending GOC conversation regarding ECHO findings with family.   Subjective:  Patient is very sleepy, she says she has no concerns. She denies chest pain or palpitations.   Objective: Temp:  [97.7 F (36.5 C)-98.2 F (36.8 C)] 97.7 F (36.5 C) (07/18 0915) Pulse Rate:  [57-76] 62 (07/18 0915) Resp:  [17-24] 17 (07/18 0915) BP: (88-141)/(65-97) 138/91 (07/18 0915) SpO2:  [93 %-99 %] 97 % (07/18 0915) Physical Exam: General: Sleepy, laying stomach down.  Cardiovascular: RRR, well perfused,  difficult to auscultate due to positioning  Respiratory: No increased work of breathing on room air  Abdomen: Non distended, no pain to palpation Extremities: No BLE edema   Laboratory: Most recent CBC Lab Results  Component Value Date   WBC 4.7 03/13/2022   HGB 12.4 03/13/2022   HCT 37.0 03/13/2022   MCV 109.5 (H) 03/13/2022   PLT 187 03/13/2022   Most recent BMP    Latest Ref Rng & Units 03/13/2022    6:32 AM  BMP  Glucose 70 - 99 mg/dL 87   BUN 8 - 23 mg/dL 14   Creatinine 7.10 - 1.00 mg/dL 6.26   Sodium 948 - 546 mmol/L 139   Potassium 3.5 - 5.1 mmol/L 3.7   Chloride 98 - 111 mmol/L 111   CO2 22 - 32 mmol/L 23   Calcium 8.9 - 10.3 mg/dL 8.4     EEG:  This study is within normal limits. No seizures or epileptiform discharges were seen throughout the recording.  ECHO:    1. Left ventricular ejection fraction, by estimation, is 55 to 60%. The  left ventricle has normal function. The left ventricle has no regional  wall motion abnormalities. There is mild left ventricular hypertrophy.  Left ventricular diastolic parameters  are indeterminate.   2. Right ventricular systolic function is normal. The right ventricular  size is moderately enlarged.   3. On image 79, concern for significant left to right shunt by color  doppler, concerning for ASD. Evidence of atrial level shunting detected by  color flow Doppler. There  is a large secundum atrial septal defect with  predominantly left to right shunting  across the atrial septum.   4. Right atrial size was moderately dilated.   5. A small pericardial effusion is present. The pericardial effusion is  circumferential. There is no evidence of cardiac tamponade.   6. The mitral valve is normal in structure. Trivial mitral valve  regurgitation. No evidence of mitral stenosis.   7. The aortic valve is tricuspid. Aortic valve regurgitation is not  visualized. No aortic stenosis is present.   8. The inferior vena cava is normal  in size with greater than 50%  respiratory variability, suggesting right atrial pressure of 3 mmHg.   Lockie Mola MD   PGY-1, [facility] FPTS Intern pager: [phone], text pages welcome Secure chat group [facility] Teaching Service

## 2022-03-14 NOTE — Consult Note (Signed)
Consultation Note Date: 03/14/2022   Patient Name: Yvonne Tyler  DOB: 1956/08/13  MRN: [id number]  Age / Sex: 66 y.o., female  PCP: Alfredo Martinez, MD Referring Physician: Westley Chandler, MD  Reason for Consultation:  "We found a large hole in her heart on ECHO. Cardiology says repair surgery is very involvoed and a larger surgery. We would like your hlep with goals of care convo helping family to weigh pros and cons"  HPI/Patient Profile: 66 y.o. female  with past medical history of Down syndrome, cataracts, B12 deficiency admitted on 03/12/2022 with syncopal episodes.  Work-up has revealed atrial septal defect that is symptomatic with recurrent symptoms syncope.  Palliative medicine consulted for above.  Primary Decision Maker LEGAL GUARDIAN - [name]  Discussion: Chart reviewed including labs, progress notes from this and current admissions as well as care everywhere, imaging. Evaluated patient she was awake and alert.  Very pleasant.  She tells me she is in the hospital because of her cataracts.  She is unable to participate in goals of care conversation due to Down syndrome affecting her cognitive processing. [name] was at bedside.  Introduced palliative medicine.   Prior to this admission patient was doing functionally well but having syncopal episodes.  [name] tells me she is unsure of what the options are for [name].  She is looking forward to speaking with cardiology.  She is aware there is a possibility of surgery but has not had in-depth conversation with cardiology at this point.  SUMMARY OF RECOMMENDATIONS -We discussed that it would likely be better if [name] has complete information from cardiology regarding risks of surgery prior to a goals of care discussion -Plan was made to meet tomorrow at 1130 after [name] has had a chance to speak with cardiology -If patient  discharges prior to tomorrow would recommend outpatient palliative referral  Code Status/Advance Care Planning: DNR   Prognosis:   Unable to determine  Discharge Planning: To Be Determined  Primary Diagnoses: Present on Admission:  (Resolved) AKI (acute kidney injury) (HCC)  BLINDNESS, LEGAL, [place] DEFINITION   Review of Systems  Physical Exam  Vital Signs: BP (!) 95/59 (BP Location: Right Arm)   Pulse 65   Temp 98.4 F (36.9 C)   Resp 15   LMP  (LMP Unknown) Comment: Mentaly challenged  SpO2 99%  Pain Scale: 0-10   Pain Score: Asleep   SpO2: SpO2: 99 % O2 Device:SpO2: 99 % O2 Flow Rate: .   IO: Intake/output summary:  Intake/Output Summary (Last 24 hours) at 03/14/2022 1706 Last data filed at 03/14/2022 1500 Gross per 24 hour  Intake 480 ml  Output 750 ml  Net -270 ml    LBM: Last BM Date :  (PTA) Baseline Weight:   Most recent weight:         Thank you for this consult. Palliative medicine will continue to follow and assist as needed.  Time Total: 80 minutes Greater than 50%  of this time was spent counseling and coordinating care related  to the above assessment and plan.  Signed by: Mariana Kaufman, AGNP-C Palliative Medicine    Please contact Palliative Medicine Team phone at [phone] for questions and concerns.  For individual provider: See Shea Evans

## 2022-03-15 DIAGNOSIS — R55 Syncope and collapse: Secondary | ICD-10-CM | POA: Diagnosis not present

## 2022-03-15 SURGERY — ECHOCARDIOGRAM, TRANSESOPHAGEAL
Anesthesia: Monitor Anesthesia Care

## 2022-03-15 NOTE — Discharge Summary (Addendum)
Family Medicine Teaching [facility] Discharge Summary  Patient name: Yvonne Tyler Medical record number: [id number] Date of birth: 03-Jan-1956 Age: 66 y.o. Gender: female Date of Admission: 03/12/2022  Date of Discharge:03/15/22  Admitting Physician: Lockie Mola, MD  Primary Care Provider: Alfredo Martinez, MD Consultants: Cardiology  Indication for Hospitalization: Syncopal episode  Brief Hospital Course:  Yvonne Tyler is a 66 y.o. female who presented after a syncopal episode.  Her past medical history is significant for Down syndrome, legal blindness, history of seizure-like activity, history of syncopal episodes.  Her hospital course as outlined by the problems listed below.  Syncopal episode likely 2/2 heat, dehydration Patient admitted after having a syncopal episode [place] a water park. She immediately returned back to baseline. Her EKG showed possible ST changes but troponins unremarkable.  CT head showed no acute abnormalities, stable atrophy and chronic small vessel ischemia.  She received 1 L bolus of normal saline in the ED and continued on mIVF. She was observed with cardiac telemetry.  Neurology was consulted on 7/17 to evaluate for seizures given loss of bowel and bladder; EEG unremarkable. Echo was also obtained which showed EF 55-60%, mild LVH, ASD, small pericardial effusion, and evidence of pulmonary HTN. Cardiology consulted believes the ASD to be incidental. They do not believe the ASD or pulmonary hypertension to be a cause of the syncope as she does not seem to have right sided heart failure or Eisenmenger's disease at this time. They will continue to follow outpatient to discuss implanted loop recorder for possibly arrythmia. Patient was seen by palliative care and will continue to follow up outpatient. By discharge, she was improved without further incidents of syncope or neurological dysfunction.  AKI Creatinine on admission was 1.39 up from baseline of 0.85.   Patient was hydrated with maintenance fluids during admission and avoided nephrotoxic medications.  Last creatinine before discharge was 0.98 and patient had adequate UOP.   Items for follow-up: 1. Cardiology o/p follow up recommendations and implantable loop recorder  2. Follow up with palliative care to discuss long term goals of care, ensure referral placed  2. PO intake, further episodes of syncope/ seizure like activity  3. BMP to assess Cr  Discharge Diagnoses/Problem List:  Principal Problem for Admission: Syncopal episode Other Problems addressed during stay:  Acute kidney injury, resolved Atrial septal defect    Disposition: Home   Discharge Condition: Stable   Discharge Exam:  Vitals:   03/15/22 0522 03/15/22 0751  BP: (!) 122/98   Pulse: 63   Resp: 15   Temp: 98 F (36.7 C)   SpO2: 100% 100%   General: Sleepy in bed, pleasant when awaken  CV: RRR, II/VI systolic murmur left sternal border, pulses equal and palpable  Resp: Comfortably breathing on room air  Abd: Soft, non tender, non distended  Neuro: Sleepy, difficult to arouse but arousable with stimulation.   Significant Procedures: EEG  Significant Labs and Imaging:  Cr 1.39 (7/16), 0.98 (7/17)  ECHO   1. Left ventricular ejection fraction, by estimation, is 55 to 60%. The  left ventricle has normal function. The left ventricle has no regional  wall motion abnormalities. There is mild left ventricular hypertrophy.  Left ventricular diastolic parameters  are indeterminate.   2. Right ventricular systolic function is normal. The right ventricular  size is moderately enlarged.   3. On image 79, concern for significant left to right shunt by color  doppler, concerning for ASD. Evidence of atrial level shunting  detected by  color flow Doppler. There is a large secundum atrial septal defect with  predominantly left to right shunting  across the atrial septum.   4. Right atrial size was moderately dilated.    5. A small pericardial effusion is present. The pericardial effusion is  circumferential. There is no evidence of cardiac tamponade.   6. The mitral valve is normal in structure. Trivial mitral valve  regurgitation. No evidence of mitral stenosis.   7. The aortic valve is tricuspid. Aortic valve regurgitation is not  visualized. No aortic stenosis is present.   8. The inferior vena cava is normal in size with greater than 50%  respiratory variability, suggesting right atrial pressure of 3 mmHg.   CT Head IMPRESSION: 1. No acute intracranial abnormality. 2. Stable atrophy and chronic small vessel ischemia.    EEG This study is within normal limits. No seizures or epileptiform discharges were seen throughout the recording.  Results/Tests Pending at Time of Discharge: None  Discharge Medications:  Allergies as of 03/15/2022   No Known Allergies      Medication List    You have not been prescribed any medications.     Discharge Instructions: Please refer to Patient Instructions section of EMR for full details.  Patient was counseled important signs and symptoms that should prompt return to medical care, changes in medications, dietary instructions, activity restrictions, and follow up appointments.   Follow-Up Appointments:  Follow-up Information     [facility]. Go on 03/17/2022.   Specialty: Family Medicine Why: At 10:30 am.  Please arrive by 10:15 AM.  This is a hospital follow-up at the [facility].  Your primary care doctor, Dr. Jena Gauss, is not available at this time, so will see one of her other providers.  They will talk to you about Contact information: [address] [id number] mc [place] [address] [phone]               Follow up appointment with Palliative care to be set up   Lockie Mola, MD 03/15/2022, 11:33 AM PGY-1, [facility]

## 2022-03-15 NOTE — Progress Notes (Signed)
No changes in patient's medications on discharge. Discharge information given to legal guardian [employer].

## 2022-03-16 NOTE — Progress Notes (Signed)
Palliative-   Noted patient is scheduled for discharge.  Spoke with her guardian- they are in agreement for referral to outpatient Hospice for continued goals of care discussion.   Ocie Bob, AGNP-C Palliative Medicine  No charge

## 2022-03-17 ENCOUNTER — Ambulatory Visit (INDEPENDENT_AMBULATORY_CARE_PROVIDER_SITE_OTHER): Payer: Medicare Other | Admitting: Family Medicine

## 2022-03-17 ENCOUNTER — Telehealth: Payer: Self-pay

## 2022-03-17 VITALS — BP 110/60 | HR 55 | Ht 60.0 in | Wt 107.2 lb

## 2022-03-17 DIAGNOSIS — Z09 Encounter for follow-up examination after completed treatment for conditions other than malignant neoplasm: Secondary | ICD-10-CM | POA: Insufficient documentation

## 2022-03-17 LAB — CULTURE, BLOOD (ROUTINE X 2)
Culture: NO GROWTH
Culture: NO GROWTH
Special Requests: ADEQUATE
Special Requests: ADEQUATE

## 2022-03-17 NOTE — Progress Notes (Signed)
    SUBJECTIVE:   CHIEF COMPLAINT / HPI:   Patient presents for hospital follow up after syncopal episode, she is accompanied by her cousin. Also has a history of seizure. Since discharge, denies any syncopal episodes or seizure activity. She has been doing well and cousin confirms this. Denies chest pain, dyspnea or other symptoms. They have been referred to see cardiology (Dr. Jacinto Halim) and will make an appointment for a holter monitor. She has been maintaining appropriate PO intake and hydration since discharge. Referral placed for palliative care so this process is also pending, cousin states that she will call them today to follow up as well.   OBJECTIVE:   BP 110/60   Pulse (!) 55   Ht 5' (1.524 m)   Wt 107 lb 3.2 oz (48.6 kg)   LMP  (LMP Unknown) Comment: Mentaly challenged  SpO2 98%   BMI 20.94 kg/m   General: Patient well-appearing, in no acute distress. HEENT: non-tender thyroid, no evidence of cervical LAD, normal buccal mucosa  CV: RRR, no murmurs or gallops auscultated Resp: CTAB, no wheezing, rales or rhonchi noted Ext: no LE edema noted bilaterally, radial pulses present bilaterally, capillary refill less than 2 sec  Psych: mood appropriate, very pleasant   ASSESSMENT/PLAN:   Hospital discharge follow-up -doing well after recent hospitalization for syncope, maintaining appropriate PO intake and hydration. BP appropriate as well. -encouraged maintaining appropriate intake -pending BMP to monitor Cr given recent AKI  -follow up with cardiology outpatient -follow up with palliative care outpatient to further discuss goals of care -follow up with PCP in 1-2 months as appropriate    Anhad Sheeley Robyne Peers, DO [facility]

## 2022-03-17 NOTE — Assessment & Plan Note (Signed)
-  doing well after recent hospitalization for syncope, maintaining appropriate PO intake and hydration. BP appropriate as well. -encouraged maintaining appropriate intake -pending BMP to monitor Cr given recent AKI  -follow up with cardiology outpatient -follow up with palliative care outpatient to further discuss goals of care -follow up with PCP in 1-2 months as appropriate

## 2022-03-17 NOTE — Patient Instructions (Addendum)
It was great seeing you today!  I am glad that you have been doing well since your recent hospitalization. Keep eating well and drinking at least 6-8 glasses water a day at least. We also got blood work to make sure that your kidney function, I will let you know of any abnormal results.   Please make sure to follow up with the cardiologist and palliative care specialist.   Please follow up at your next scheduled appointment in 1 month, if anything arises between now and then, please don't hesitate to contact our office.   Thank you for allowing [place] to be a part of your medical care!  Thank you, Dr. Robyne Peers

## 2022-03-17 NOTE — Telephone Encounter (Signed)
Attempted to contact patient to schedule a Palliative Care consult appointment. No answer unable to leave a message. Voicemail is full.

## 2022-03-18 ENCOUNTER — Encounter: Payer: Self-pay | Admitting: Family Medicine

## 2022-03-18 LAB — BASIC METABOLIC PANEL
BUN/Creatinine Ratio: 17 (ref 12–28)
BUN: 15 mg/dL (ref 8–27)
CO2: 24 mmol/L (ref 20–29)
Calcium: 9.1 mg/dL (ref 8.7–10.3)
Chloride: 102 mmol/L (ref 96–106)
Creatinine, Ser: 0.9 mg/dL (ref 0.57–1.00)
Glucose: 83 mg/dL (ref 70–99)
Potassium: 4.2 mmol/L (ref 3.5–5.2)
Sodium: 140 mmol/L (ref 134–144)
eGFR: 71 mL/min/{1.73_m2} (ref >59)

## 2022-03-21 ENCOUNTER — Telehealth: Payer: Self-pay

## 2022-03-21 NOTE — Telephone Encounter (Signed)
Attempted to contact patient to schedule a Palliative Care consult appointment. No answer and unable to leave a message voicemail is full.  

## 2022-03-23 ENCOUNTER — Encounter: Payer: Self-pay | Admitting: Cardiology

## 2022-03-23 ENCOUNTER — Ambulatory Visit: Payer: Medicare Other | Admitting: Cardiology

## 2022-03-23 DIAGNOSIS — R55 Syncope and collapse: Secondary | ICD-10-CM

## 2022-03-23 DIAGNOSIS — Q211 Atrial septal defect, unspecified: Secondary | ICD-10-CM

## 2022-03-23 NOTE — Progress Notes (Signed)
Follow up visit  Subjective:   Yvonne Tyler, female    DOB: 09-13-1955, 66 y.o.   MRN: [id number]   HPI  Chief Complaint  Patient presents with   New Patient (Initial Visit)   Hospitalization Follow-up   Hypotension   Loss of Consciousness    66 y.o. African American female with Down's syndrome, blindness, vit B12 deficiency, ASD, recurrent syncope.  Patient is here today with her cousin and legal guardian Yvonne Tyler.  Patient was recently in [facility] after an episode of syncope that occurred while outside at a splash pad during a hot day. She had just eaten something, had been out in the sun for about 30 min around 11:30 AM. She suddenly slumped over and became unresponsive. She had generalized stiffness, but no obvious shaking. She lost her bladder and bowel control. She was made to lay down flat by her family, she regained consciousness in a few min, briefly lost it again, before regaining it again.Similar episode happened in 08/2021 after having had a large meal. Patient was found to be dehydrated with mild AKI. EEG did not show seizures while in the hospital. Echocardiogram showed new finding of an ASD. She was then seen by Dr. Jacinto Halim. It was felt that ASD was likely an incidental finding in a Down's patient, but not the cause of the syncope. Given that she is asymptomatic from the ASD and that repair would not necessarily make an quality of life change, no further workup was recommended for ASD. Loop recorder was discussed.  Patient has not had any recurrent syncope since then. Today, Yvonne Tyler, her mother, and I discussed further re: the same- details below.   Cardiovascular & other pertient studies:  Reviewed external labs and tests, independently interpreted  EKG 03/23/2022: Sinus rhythm 54 bpm Incomplete right bundle branch block Rightward axis Left atrial enlargement  Echocardiogram 03/13/2022:  1. Left ventricular ejection fraction, by estimation, is  55 to 60%. The  left ventricle has normal function. The left ventricle has no regional  wall motion abnormalities. There is mild left ventricular hypertrophy.  Left ventricular diastolic parameters  are indeterminate.   2. Right ventricular systolic function is normal. The right ventricular  size is moderately enlarged.   3. On image 79, concern for significant left to right shunt by color  doppler, concerning for ASD. Evidence of atrial level shunting detected by  color flow Doppler. There is a large secundum atrial septal defect with  predominantly left to right shunting  across the atrial septum.   4. Right atrial size was moderately dilated.   5. A small pericardial effusion is present. The pericardial effusion is  circumferential. There is no evidence of cardiac tamponade.   6. The mitral valve is normal in structure. Trivial mitral valve  regurgitation. No evidence of mitral stenosis.   7. The aortic valve is tricuspid. Aortic valve regurgitation is not  visualized. No aortic stenosis is present.   8. The inferior vena cava is normal in size with greater than 50%  respiratory variability, suggesting right atrial pressure of 3 mmHg.   Comparison(s): Prior images unable to be directly viewed, comparison made  by report only. Changes from prior study are noted.   Conclusion(s)/Recommendation(s): Moderate pericardial effusion without  tamponade. Moderate to severely dilated RV (appears larger than LV in  several views). TR inadequate to assess right sided pressures. 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 Findings  reported to Dr. Manson Passey and family medicine  teaching service.   Recent labs: 03/17/2022: Glucose 83, BUN/Cr 15/0.9. EGFR 71. Na/K 140/4.2.  H/H 12/37. MCV 109. Platelets 187   Review of Systems  Unable to perform ROS: Patient nonverbal        Vitals:   03/23/22 1313 03/23/22 1314  SpO2: 99% 93%   Orthostatic VS for the past 72 hrs (Last 3 readings):  Orthostatic  BP Patient Position BP Location Cuff Size Orthostatic Pulse  03/23/22 1315 121/76 Sitting Left Arm Normal 62  03/23/22 1314 105/62 Sitting Left Arm Normal 56  03/23/22 1313 109/68 Supine Left Arm Normal 57     Objective:   Physical Exam Vitals and nursing note reviewed.  Constitutional:      General: She is not in acute distress. Neck:     Vascular: No JVD.  Cardiovascular:     Rate and Rhythm: Normal rate and regular rhythm.     Heart sounds: Murmur heard.     Blowing holosystolic murmur is present with a grade of 2/6 at the upper right sternal border.  Pulmonary:     Effort: Pulmonary effort is normal.     Breath sounds: Normal breath sounds. No wheezing or rales.             Visit diagnoses:   ICD-10-CM   1. Syncope and collapse  R55 EKG 12-Lead       Orders Placed This Encounter  Procedures   EKG 12-Lead      Assessment & Recommendations:    66 y.o. African American female with Down's syndrome, blindness, vit B12 deficiency, ASD, recurrent syncope.  I had a long discussion with patient's legal guardians Yvonne Tyler and her mother.  As discussed previously, he is to use any incidental finding and unlikely to be the cause of syncope.  Also, unlikely to benefit from ASD repair at age 75 in a Down syndrome patient without symptoms.  Her episode of loss of consciousness could either be due to syncope or seizure.  We discussed rhythm monitoring, through either external monitor or loop recorder.  [employer] is convinced that the patient will play with the external monitor and would not stay on for long.  Furthermore, we discussed about the goal of a loop recorder will be as a diagnostic procedure.  Should we identify any rhythm problems, including heart block, pacemaker may be the next step.  However, delivery is on the same page that they would not like to proceed with treatment options such as pacemaker.  In that case, I do not see any benefit with loop recorder.  Overall,  my suspicion for vasovagal syncope remains high.  Encourage hydration, consider compression stockings.  In case of prodromal symptoms of vasovagal syncope, recommend supine position, rapid hydration.  [name] and her mother were in agreement with this decision.  I will see her back in 3 months.  Time spent: 45-minute       Nigel Mormon, MD Pager: [phone] Office: [phone]

## 2022-03-24 ENCOUNTER — Encounter: Payer: Self-pay | Admitting: Cardiology

## 2022-03-27 ENCOUNTER — Telehealth: Payer: Self-pay

## 2022-03-27 NOTE — Telephone Encounter (Signed)
Attempted to contact patient to schedule a Palliative Care consult appointment. No answer unable to leave a message voicemail is full. Sent patient a text also.

## 2022-03-29 ENCOUNTER — Encounter: Payer: Self-pay | Admitting: Podiatry

## 2022-03-29 ENCOUNTER — Ambulatory Visit (INDEPENDENT_AMBULATORY_CARE_PROVIDER_SITE_OTHER): Payer: Medicare Other | Admitting: Podiatry

## 2022-03-29 DIAGNOSIS — M79674 Pain in right toe(s): Secondary | ICD-10-CM | POA: Diagnosis not present

## 2022-03-29 DIAGNOSIS — B351 Tinea unguium: Secondary | ICD-10-CM | POA: Diagnosis not present

## 2022-03-29 NOTE — Progress Notes (Signed)
Complaint:  Visit Type: Patient returns to my office for continued preventative foot care services. Complaint: Patient states" my nails have grown long and thick and become painful to walk and wear shoes"  The patient presents for preventative foot care services. No changes to ROS.  Patient presents to the office with her caregiver..  Podiatric Exam: Vascular: dorsalis pedis and posterior tibial pulses are palpable bilateral. Capillary return is immediate. Temperature gradient is WNL. Skin turgor WNL  Sensorium: Normal Semmes Weinstein monofilament test. Normal tactile sensation bilaterally. Nail Exam: Pt has thick disfigured discolored nails with subungual debris noted bilateral entire nail hallux through fifth toenails,  Especially right great toenail. Ulcer Exam: There is no evidence of ulcer or pre-ulcerative changes or infection. Orthopedic Exam: Muscle tone and strength are WNL. No limitations in general ROM. No crepitus or effusions noted. HAV  B/L.  Hammer toes  B/L. Skin: No Porokeratosis. No infection or ulcers  Diagnosis:  Onychomycosis, , Pain in right toe, pain in left toes  Treatment & Plan Procedures and Treatment: Consent by patient was obtained for treatment procedures.   Debridement of mycotic and hypertrophic toenails, 1 through 5 bilateral and clearing of subungual debris. No ulceration, no infection noted.  Return Visit-Office Procedure: Patient instructed to return to the office for a follow up visit  3   months for continued evaluation and treatment.    Xadrian Craighead DPM 

## 2022-04-14 ENCOUNTER — Encounter: Payer: Self-pay | Admitting: Student

## 2022-04-14 ENCOUNTER — Ambulatory Visit (INDEPENDENT_AMBULATORY_CARE_PROVIDER_SITE_OTHER): Payer: Medicare Other | Admitting: Student

## 2022-04-14 VITALS — BP 110/60 | HR 55 | Ht 60.0 in | Wt 105.0 lb

## 2022-04-14 DIAGNOSIS — Z0000 Encounter for general adult medical examination without abnormal findings: Secondary | ICD-10-CM

## 2022-04-14 DIAGNOSIS — Q211 Atrial septal defect, unspecified: Secondary | ICD-10-CM | POA: Diagnosis not present

## 2022-04-14 DIAGNOSIS — J029 Acute pharyngitis, unspecified: Secondary | ICD-10-CM | POA: Insufficient documentation

## 2022-04-14 DIAGNOSIS — M79602 Pain in left arm: Secondary | ICD-10-CM | POA: Diagnosis not present

## 2022-04-14 DIAGNOSIS — R55 Syncope and collapse: Secondary | ICD-10-CM | POA: Diagnosis not present

## 2022-04-14 MED ORDER — SHINGRIX 50 MCG/0.5ML IM SUSR: 0.5000 mL | Freq: Once | INTRAMUSCULAR | 0 refills | Status: AC

## 2022-04-14 MED ORDER — OMEPRAZOLE 20 MG PO CPDR: 20.0000 mg | DELAYED_RELEASE_CAPSULE | Freq: Every day | ORAL | 3 refills | Status: DC

## 2022-04-14 MED ORDER — DICLOFENAC SODIUM 1 % EX GEL: 2.0000 g | Freq: Four times a day (QID) | CUTANEOUS | 1 refills | Status: DC

## 2022-04-14 NOTE — Assessment & Plan Note (Signed)
No further management per cardiology, follow with them as indicated

## 2022-04-14 NOTE — Assessment & Plan Note (Signed)
Patient family number, cousin, request possible neurology referral.  I do not feel like this is necessary at this time given improvement of patient and lack of symptoms since discharge from the hospital.  It appears that this is most likely a vasovagal event.  However, if continues, will consider neurology referral at that time.  Continue with staying cool during the summertime as well as maintaining water intake.

## 2022-04-14 NOTE — Assessment & Plan Note (Signed)
Broad differential including most likely GERD vs allergies.  Lung exam completely unremarkable without systemic symptoms, patient hemodynamically stable.  We will continue with trial PPI for treatment and continue to monitor symptoms, return if symptoms persist

## 2022-04-14 NOTE — Patient Instructions (Addendum)
It was great to see you today! Thank you for choosing [facility] for your primary care. Yvonne Tyler was seen for follow up   Today we addressed: Take omeprazole daily for throat  Continue with voltaren gel for arm pain    Follow up with palliative  ] You can get the Shingrix vaccine at the pharmacy. You will need a 2nd shot 2-6 months after the first. This vaccine is important to help prevent shingles.    If you haven't already, sign up for My Chart to have easy access to your labs results, and communication with your primary care physician.    You should return to our clinic Return in about 2 months (around 06/14/2022) for follow up. Please arrive 15 minutes before your appointment to ensure smooth check in process.  We appreciate your efforts in making this happen.  Thank you for allowing me to participate in your care, Alfredo Martinez, MD 04/14/2022, 10:18 AM PGY-2, [facility]

## 2022-04-14 NOTE — Progress Notes (Signed)
SUBJECTIVE:   CHIEF COMPLAINT / HPI:   Syncopal Episode  One episode with urinary incontinence that occurred prior to last hospital admission last month.  Since discharge from the hospital, she has not had any other episodes of syncope.  Was found to have no seizure on EEG in the hospital and has had full cardiac work-up.  Saw cardiology outpatient who did not want to do anything about her ASD and instructed to treat as a vasovagal episode.  Patient is doing well today.  She reports of some throat and mouth pain as well as the left arm pain, located on the forearm.  Patient has associated raspy cough per cousin, has been present for several weeks.  Patient has difficulty verbalizing any more details about her pain as she has Down syndrome.  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   Vitamin B12 deficiency 01/16/2020    OBJECTIVE:  BP 110/60   Pulse (!) 55   Ht 5' (1.524 m)   Wt 105 lb (47.6 kg)   LMP  (LMP Unknown) Comment: Mentaly challenged  SpO2 98%   BMI 20.51 kg/m   General: NAD, pleasant, able to participate in exam Cardiac: RRR, no murmurs auscultated Respiratory: CTAB, normal WOB Abdomen: soft, non-tender, non-distended, normoactive bowel sounds Extremities: warm and well perfused, no edema or cyanosis, FROM and strength 5/5 of left UE, no TTP or skin changes  Skin: warm and dry, no rashes noted Neuro: alert, no obvious focal deficits, speech normal Psych: Normal affect and mood  ASSESSMENT/PLAN:  Atrial septal defect with recurrent syncope and concern for pulmonary HTN  No further management per cardiology, follow with them as indicated   Syncope  and collapse Patient family number, cousin, request possible neurology referral.  I do not feel like this is necessary at this time given improvement of patient and lack of symptoms since discharge from the hospital.  It appears that this is most likely a vasovagal event.  However, if continues, will consider neurology referral at that time.  Continue with staying cool during the summertime as well as maintaining water intake.  Sore throat Broad differential including most likely GERD vs allergies.  Lung exam completely unremarkable without systemic symptoms, patient hemodynamically stable.  We will continue with trial PPI for treatment and continue to monitor symptoms, return if symptoms persist  Pain of left upper extremity Gave Voltaren gel, unable to assess if pain is located on the skin or deeper into the muscle.  Patient has full range of motion of the extremity and has no abnormalities on skin.  This is reassuring, will continue Voltaren gel for MSK pain.   Shingrix given    No orders of the defined types were placed in this encounter.  Meds ordered this encounter  Medications   Zoster Vaccine Adjuvanted [facility]) injection    Sig: Inject 0.5 mLs into the muscle once for 1 dose.    Dispense:  0.5 mL    Refill:  0   omeprazole (PRILOSEC) 20 MG capsule    Sig: Take 1 capsule (20 mg total) by mouth daily.    Dispense:  30  capsule    Refill:  3   diclofenac Sodium (VOLTAREN) 1 % GEL    Sig: Apply 2 g topically 4 (four) times daily.    Dispense:  50 g    Refill:  1   Return in about 2 months (around 06/14/2022) for follow up. Alfredo Martinez, MD 04/14/2022, 1:22 PM PGY-2, [facility]

## 2022-04-14 NOTE — Assessment & Plan Note (Signed)
Gave Voltaren gel, unable to assess if pain is located on the skin or deeper into the muscle.  Patient has full range of motion of the extremity and has no abnormalities on skin.  This is reassuring, will continue Voltaren gel for MSK pain.

## 2022-06-21 ENCOUNTER — Ambulatory Visit: Payer: Medicare Other | Admitting: Cardiology

## 2022-06-30 ENCOUNTER — Ambulatory Visit (INDEPENDENT_AMBULATORY_CARE_PROVIDER_SITE_OTHER): Payer: Medicare Other | Admitting: Podiatry

## 2022-06-30 DIAGNOSIS — B351 Tinea unguium: Secondary | ICD-10-CM | POA: Diagnosis not present

## 2022-06-30 DIAGNOSIS — H548 Legal blindness, as defined in USA: Secondary | ICD-10-CM

## 2022-06-30 DIAGNOSIS — M79674 Pain in right toe(s): Secondary | ICD-10-CM

## 2022-06-30 NOTE — Progress Notes (Signed)
Complaint:  Visit Type: Patient returns to my office for continued preventative foot care services. Complaint: Patient states" my nails have grown long and thick and become painful to walk and wear shoes"  The patient presents for preventative foot care services. No changes to ROS.  Patient presents to the office with her caregiver..  Podiatric Exam: Vascular: dorsalis pedis and posterior tibial pulses are palpable bilateral. Capillary return is immediate. Temperature gradient is WNL. Skin turgor WNL  Sensorium: Normal Semmes Weinstein monofilament test. Normal tactile sensation bilaterally. Nail Exam: Pt has thick disfigured discolored nails with subungual debris noted bilateral entire nail hallux through fifth toenails,  Especially right great toenail. Ulcer Exam: There is no evidence of ulcer or pre-ulcerative changes or infection. Orthopedic Exam: Muscle tone and strength are WNL. No limitations in general ROM. No crepitus or effusions noted. HAV  B/L.  Hammer toes  B/L. Skin: No Porokeratosis. No infection or ulcers  Diagnosis:  Onychomycosis, , Pain in right toe, pain in left toes  Treatment & Plan Procedures and Treatment: Consent by patient was obtained for treatment procedures.   Debridement of mycotic and hypertrophic toenails, 1 through 5 bilateral and clearing of subungual debris. No ulceration, no infection noted.  Return Visit-Office Procedure: Patient instructed to return to the office for a follow up visit  3   months for continued evaluation and treatment.    Gardiner Barefoot DPM

## 2022-07-12 ENCOUNTER — Encounter: Payer: Self-pay | Admitting: Student

## 2022-07-12 ENCOUNTER — Ambulatory Visit (INDEPENDENT_AMBULATORY_CARE_PROVIDER_SITE_OTHER): Payer: Medicare Other | Admitting: Student

## 2022-07-12 VITALS — BP 102/66 | HR 54 | Ht 60.0 in | Wt 109.6 lb

## 2022-07-12 DIAGNOSIS — R410 Disorientation, unspecified: Secondary | ICD-10-CM

## 2022-07-12 DIAGNOSIS — G4724 Circadian rhythm sleep disorder, free running type: Secondary | ICD-10-CM | POA: Diagnosis not present

## 2022-07-12 DIAGNOSIS — E782 Mixed hyperlipidemia: Secondary | ICD-10-CM | POA: Diagnosis not present

## 2022-07-12 DIAGNOSIS — D7589 Other specified diseases of blood and blood-forming organs: Secondary | ICD-10-CM

## 2022-07-12 DIAGNOSIS — H548 Legal blindness, as defined in USA: Secondary | ICD-10-CM | POA: Diagnosis not present

## 2022-07-12 DIAGNOSIS — Z23 Encounter for immunization: Secondary | ICD-10-CM | POA: Diagnosis not present

## 2022-07-12 MED ORDER — TASIMELTEON 20 MG PO CAPS: 1.0000 | ORAL_CAPSULE | Freq: Every day | ORAL | 0 refills | Status: DC

## 2022-07-12 NOTE — Progress Notes (Signed)
SUBJECTIVE:   CHIEF COMPLAINT / HPI:   Sleep Difficulty  Concern for Insomnia  Patient is a 66 yo presents for sleep issues.  Onset was 2 months ago. Family describes symptoms as difficulty falling asleep.  Patient seems to wait until everyone falls asleep after lying down at 11 PM.  She then gets up around the house until approximately 2 to 3 AM.  She then wakes up at 10 AM and sometimes sleeps throughout the day.  Patient has not attempted self treatment. Associated symptoms include: none.  Family denies anxiety, depression, fatigue, frequent nighttime urination, irritability, leg cramps, restless legs, snoring, and stress. Symptoms have gradually worsened.  No other aggravating or relieving factors.  She seems to stay up and talk throughout the night.  Disoriented for about 1 month.  Family member reports that she used to know how to ambulate to the bathroom but now is unsure where the bathroom is in the house.  This is different for her.  The patient is also somewhat forgetful.  No difficulty with swallowing or eating.  PERTINENT  PMH / PSH: Down syndrome, HLD, h/o syncope, vit B12 deficiency, blindness   OBJECTIVE:  BP 102/66   Pulse (!) 54   Ht 5' (1.524 m)   Wt 109 lb 9.6 oz (49.7 kg)   LMP  (LMP Unknown) Comment: Mentaly challenged  SpO2 99%   BMI 21.40 kg/m  Physical Exam Vitals reviewed.  Constitutional:      Appearance: Normal appearance.     Comments: Patient sitting in wheelchair, baseline responsiveness to questioning.   HENT:     Head: Normocephalic.     Comments: Down syndrome facies     Nose: Nose normal.     Mouth/Throat:     Mouth: Mucous membranes are moist.  Eyes:     Comments: B/l cataract  Cardiovascular:     Rate and Rhythm: Normal rate and regular rhythm.  Pulmonary:     Effort: Pulmonary effort is normal.     Breath sounds: Normal breath sounds.  Abdominal:     General: Abdomen is flat.     Palpations: Abdomen is soft.  Musculoskeletal:      Cervical back: Normal range of motion.  Skin:    General: Skin is warm.     Capillary Refill: Capillary refill takes less than 2 seconds.  Neurological:     General: No focal deficit present.     Mental Status: She is alert. Mental status is at baseline.     Sensory: No sensory deficit.     Motor: No weakness.  Psychiatric:        Mood and Affect: Mood normal.        Behavior: Behavior normal.      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 Sleep hygiene issues are reviewed.  Sleep hygiene discussed.  Tasimelteon 20 mg tabs to give 30 minutes before bedtime ordered.  Continue to monitor sleep cycle and follow-up if continues.  Orders: -     Tasimelteon; Take 1 tablet by mouth at bedtime.  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   Disorientation Assessment & Plan: Patient  with history of Down syndrome, and age 68.  Patient likely is suffering from cognitive decline causing gradual disorientation.  Neurologic exam reveals no focal neurologic deficits.  Provided support group for family members of someone with Down syndrome.  Continue to monitor symptoms.    No follow-ups on file. Erskine Emery, MD 07/13/2022, 7:59 AM  PGY-2, [place]

## 2022-07-12 NOTE — Patient Instructions (Addendum)
It was great to see you today! Thank you for choosing [facility] for your primary care. Yvonne Tyler was seen for sleep issues   Please take the medication 30 minutes before bed each night  Practice sleep hygiene  See below for local support groups     Local Support  National Down Syndrome Society (NDSS)   Practice good sleep hygiene.  Stick to a sleep schedule, even on weekends. Exercise is great, but not too late in the day Avoid alcoholic drinks before bed Avoid large meals and beverages late before bed Don't take naps after 3 pm. Keep power naps less than 1 hour.  Relax before bed.  Take a hot bath before bed.  Have a good sleeping environment. Get rid of anything in your bedroom that might distract you from sleep.  Adopt good sleeping posture.   If you haven't already, sign up for My Chart to have easy access to your labs results, and communication with your primary care physician.  I recommend that you always bring your medications to each appointment as this makes it easy to ensure you are on the correct medications and helps [place] not miss refills when you need them. Call the clinic at [phone] if your symptoms worsen or you have any concerns.   Please arrive 15 minutes before your appointment to ensure smooth check in process.  We appreciate your efforts in making this happen.  Thank you for allowing me to participate in your care, Alfredo Martinez, MD 07/12/2022, 11:45 AM PGY-2, [facility]

## 2022-07-13 ENCOUNTER — Encounter: Payer: Self-pay | Admitting: Student

## 2022-07-13 ENCOUNTER — Telehealth: Payer: Self-pay | Admitting: Student

## 2022-07-13 DIAGNOSIS — R410 Disorientation, unspecified: Secondary | ICD-10-CM | POA: Insufficient documentation

## 2022-07-13 DIAGNOSIS — E782 Mixed hyperlipidemia: Secondary | ICD-10-CM | POA: Insufficient documentation

## 2022-07-13 LAB — CBC
Hematocrit: 38.6 % (ref 34.0–46.6)
Hemoglobin: 13.4 g/dL (ref 11.1–15.9)
MCH: 35.5 pg — ABNORMAL HIGH (ref 26.6–33.0)
MCHC: 34.7 g/dL (ref 31.5–35.7)
MCV: 102 fL — ABNORMAL HIGH (ref 79–97)
Platelets: 215 10*3/uL (ref 150–450)
RBC: 3.77 x10E6/uL (ref 3.77–5.28)
RDW: 11 % — ABNORMAL LOW (ref 11.7–15.4)
WBC: 3.5 10*3/uL (ref 3.4–10.8)

## 2022-07-13 LAB — LIPID PANEL
Chol/HDL Ratio: 2.7 ratio (ref 0.0–4.4)
Cholesterol, Total: 200 mg/dL — ABNORMAL HIGH (ref 100–199)
HDL: 74 mg/dL (ref >39)
LDL Chol Calc (NIH): 98 mg/dL (ref 0–99)
Triglycerides: 164 mg/dL — ABNORMAL HIGH (ref 0–149)
VLDL Cholesterol Cal: 28 mg/dL (ref 5–40)

## 2022-07-13 NOTE — Telephone Encounter (Signed)
Clinical info completed on Placard form.  Placed form in PCP's box for completion.    When form is completed, please route note to "RN Team" and place in wall pocket in front office.   Aquilla Solian, CMA

## 2022-07-13 NOTE — Telephone Encounter (Signed)
Per patient's cousin [name] the medication prescribed for patient on yesterday; [facility] does not [facility] in [facility].  Please call the cousin at [phone]

## 2022-07-13 NOTE — Telephone Encounter (Signed)
Cousin dropped off form at front desk for [facility] [employer].  Verified that patient section of form has been completed.  Last DOS/WCC with PCP was 07/12/22.  Placed form in blue team folder to be completed by clinical staff.  Vilinda Blanks

## 2022-07-13 NOTE — Assessment & Plan Note (Signed)
Patient with history of Down syndrome, and age 66.  Patient likely is suffering from cognitive decline causing gradual disorientation.  Neurologic exam reveals no focal neurologic deficits.  Provided support group for family members of someone with Down syndrome.  Continue to monitor symptoms.

## 2022-07-13 NOTE — Assessment & Plan Note (Signed)
Avoidance of caffeine sources is strongly encouraged. Sleep hygiene issues are reviewed.  Sleep hygiene discussed.  Tasimelteon 20 mg tabs to give 30 minutes before bedtime ordered.  Continue to monitor sleep cycle and follow-up if continues.

## 2022-07-13 NOTE — Assessment & Plan Note (Signed)
Filled out for disability parking pass given comorbidities.

## 2022-07-14 ENCOUNTER — Encounter: Payer: Self-pay | Admitting: Student

## 2022-07-17 ENCOUNTER — Ambulatory Visit: Payer: Medicare Other | Admitting: Cardiology

## 2022-07-17 NOTE — Progress Notes (Signed)
Error

## 2022-07-19 ENCOUNTER — Ambulatory Visit: Payer: Medicare Other | Admitting: Cardiology

## 2022-07-26 ENCOUNTER — Ambulatory Visit: Payer: Medicare Other | Admitting: Cardiology

## 2022-07-26 ENCOUNTER — Encounter: Payer: Self-pay | Admitting: Cardiology

## 2022-07-26 VITALS — BP 105/58 | HR 56 | Resp 16 | Ht 60.0 in | Wt 103.0 lb

## 2022-07-26 DIAGNOSIS — R55 Syncope and collapse: Secondary | ICD-10-CM

## 2022-07-26 DIAGNOSIS — Q211 Atrial septal defect, unspecified: Secondary | ICD-10-CM

## 2022-07-26 NOTE — Progress Notes (Signed)
Follow up visit  Subjective:   IDALYS KONECNY, female    DOB: 01-13-1956, 66 y.o.   MRN: [id number]   HPI  Chief Complaint  Patient presents with   Loss of Consciousness   Follow-up    3 month    66 y.o. African American female with Down's syndrome, blindness, vit B12 deficiency, ASD, recurrent syncope.  Patient is doing well. She has not had any recurrent syncope episodes. According to her cousin and legal guardian Caprice Renshaw, she has had some confusion issues, but no syncope.   Initial consultation visit 02/2022: Patient is here today with her cousin and legal guardian Caprice Renshaw.  Patient was recently in [facility] after an episode of syncope that occurred while outside at a splash pad during a hot day. She had just eaten something, had been out in the sun for about 30 min around 11:30 AM. She suddenly slumped over and became unresponsive. She had generalized stiffness, but no obvious shaking. She lost her bladder and bowel control. She was made to lay down flat by her family, she regained consciousness in a few min, briefly lost it again, before regaining it again.Similar episode happened in 08/2021 after having had a large meal. Patient was found to be dehydrated with mild AKI. EEG did not show seizures while in the hospital. Echocardiogram showed new finding of an ASD. She was then seen by Dr. Einar Gip. It was felt that ASD was likely an incidental finding in a Down's patient, but not the cause of the syncope. Given that she is asymptomatic from the ASD and that repair would not necessarily make an quality of life change, no further workup was recommended for ASD. Loop recorder was discussed.  Patient has not had any recurrent syncope since then. Today, Verdene Lennert, her mother, and I discussed further re: the same- details below.   Cardiovascular & other pertient studies:  Reviewed external labs and tests, independently interpreted  EKG 07/26/2022: Sinus rhythm  54 bpm RSR(V1) -nondiagnostic Left atrial enlargement Nonspecific T-abnormality  Echocardiogram 03/13/2022:  1. Left ventricular ejection fraction, by estimation, is 55 to 60%. The  left ventricle has normal function. The left ventricle has no regional  wall motion abnormalities. There is mild left ventricular hypertrophy.  Left ventricular diastolic parameters  are indeterminate.   2. Right ventricular systolic function is normal. The right ventricular  size is moderately enlarged.   3. On image 79, concern for significant left to right shunt by color  doppler, concerning for ASD. Evidence of atrial level shunting detected by  color flow Doppler. There is a large secundum atrial septal defect with  predominantly left to right shunting  across the atrial septum.   4. Right atrial size was moderately dilated.   5. A small pericardial effusion is present. The pericardial effusion is  circumferential. There is no evidence of cardiac tamponade.   6. The mitral valve is normal in structure. Trivial mitral valve  regurgitation. No evidence of mitral stenosis.   7. The aortic valve is tricuspid. Aortic valve regurgitation is not  visualized. No aortic stenosis is present.   8. The inferior vena cava is normal in size with greater than 50%  respiratory variability, suggesting right atrial pressure of 3 mmHg.   Comparison(s): Prior images unable to be directly viewed, comparison made  by report only. Changes from prior study are noted.   Conclusion(s)/Recommendation(s): Moderate pericardial effusion without  tamponade. Moderate to severely dilated RV (appears larger than LV  in  several views). TR inadequate to assess right sided pressures. 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 Findings  reported to Dr. Owens Shark and family medicine teaching service.   Recent labs: 03/17/2022: Glucose 83, BUN/Cr 15/0.9. EGFR 71. Na/K 140/4.2.  H/H 12/37. MCV 109. Platelets 187   Review of Systems  Unable to  perform ROS: Patient nonverbal  Cardiovascular:  Negative for chest pain, dyspnea on exertion, leg swelling, palpitations and syncope.        Vitals:   07/26/22 1418 07/26/22 1424  BP: (!) 105/58   Pulse: (!) 56   Resp: 16   SpO2: 97% 97%   Orthostatic VS for the past 72 hrs (Last 3 readings):  Orthostatic BP Patient Position BP Location Cuff Size Orthostatic Pulse  07/26/22 1424 99/56 Supine Left Arm Normal 57  07/26/22 1418 -- Sitting Left Arm Normal --     Objective:   Physical Exam Vitals and nursing note reviewed.  Constitutional:      General: She is not in acute distress. Neck:     Vascular: No JVD.  Cardiovascular:     Rate and Rhythm: Normal rate and regular rhythm.     Heart sounds: Murmur heard.     Blowing holosystolic murmur is present with a grade of 2/6 at the upper right sternal border.  Pulmonary:     Effort: Pulmonary effort is normal.     Breath sounds: Normal breath sounds. No wheezing or rales.            Visit diagnoses:   ICD-10-CM   1. Syncope and collapse  R55 EKG 12-Lead       Orders Placed This Encounter  Procedures   EKG 12-Lead      Assessment & Recommendations:    66 y.o. African American female with Down's syndrome, blindness, vit B12 deficiency, ASD, recurrent syncope.  No recurrent syncope.  In 03/2022, I had a long discussion with patient's legal guardians Verdene Lennert and her mother.  As discussed previously, he is to use any incidental finding and unlikely to be the cause of syncope.  Also, unlikely to benefit from ASD repair at age 35 in a Down syndrome patient without symptoms.  Her episode of loss of consciousness could either be due to syncope or seizure.  We discussed rhythm monitoring, through either external monitor or loop recorder.  Family is convinced that the patient will play with the external monitor and would not stay on for long.  Furthermore, we discussed about the goal of a loop recorder will be as a  diagnostic procedure.  Should we identify any rhythm problems, including heart block, pacemaker may be the next step.  However, they would not like to proceed with treatment options such as pacemaker.  In that case, I do not see any benefit with loop recorder.  Overall, my suspicion for vasovagal syncope remains high.  Encourage hydration, consider compression stockings.  In case of prodromal symptoms of vasovagal syncope, recommend supine position, rapid hydration.  [name] and her mother were in agreement with this decision.  F/u in 6 months     Nigel Mormon, MD Pager: [phone] Office: [phone]

## 2022-07-27 ENCOUNTER — Other Ambulatory Visit: Payer: Self-pay | Admitting: Student

## 2022-07-27 ENCOUNTER — Telehealth: Payer: Self-pay

## 2022-07-27 DIAGNOSIS — G4724 Circadian rhythm sleep disorder, free running type: Secondary | ICD-10-CM

## 2022-07-27 MED ORDER — RAMELTEON 8 MG PO TABS: 8.0000 mg | ORAL_TABLET | Freq: Every day | ORAL | 0 refills | Status: DC

## 2022-07-27 NOTE — Telephone Encounter (Signed)
Patient's caregiver calls nurse line regarding issues with picking up sleep medication, Tasimelteon. Caregiver reports that [facility] does not carry this specialty medication and would need to be sent to a specialty pharmacy.   Caregiver is requesting an alternative sleep medication that would be covered under Medicare/ Medicaid.   Will forward request to PCP.   Veronda Prude, RN

## 2022-07-30 ENCOUNTER — Other Ambulatory Visit: Payer: Self-pay | Admitting: Student

## 2022-07-30 DIAGNOSIS — G4724 Circadian rhythm sleep disorder, free running type: Secondary | ICD-10-CM

## 2022-07-30 MED ORDER — MELATONIN 1 MG PO TABS: 1.0000 mg | ORAL_TABLET | Freq: Every day | ORAL | 1 refills | Status: DC

## 2022-08-29 ENCOUNTER — Encounter: Payer: Self-pay | Admitting: Obstetrics & Gynecology

## 2022-08-29 DIAGNOSIS — Z1231 Encounter for screening mammogram for malignant neoplasm of breast: Secondary | ICD-10-CM

## 2022-09-22 ENCOUNTER — Encounter: Payer: Self-pay | Admitting: Podiatry

## 2022-09-29 ENCOUNTER — Other Ambulatory Visit: Payer: Self-pay | Admitting: Student

## 2022-09-29 DIAGNOSIS — Z1231 Encounter for screening mammogram for malignant neoplasm of breast: Secondary | ICD-10-CM

## 2022-10-06 ENCOUNTER — Ambulatory Visit: Payer: Medicare Other | Admitting: Podiatry

## 2022-11-13 ENCOUNTER — Ambulatory Visit: Payer: Medicare Other | Admitting: Podiatry

## 2022-11-16 ENCOUNTER — Ambulatory Visit
Admission: RE | Admit: 2022-11-16 | Discharge: 2022-11-16 | Disposition: A | Discharge status: Home / Self Care | Payer: Medicare Other | Source: Ambulatory Visit | Attending: Family Medicine | Admitting: Family Medicine

## 2022-11-16 DIAGNOSIS — Z1231 Encounter for screening mammogram for malignant neoplasm of breast: Secondary | ICD-10-CM

## 2023-01-09 ENCOUNTER — Encounter: Payer: Self-pay | Admitting: Internal Medicine

## 2023-01-09 ENCOUNTER — Ambulatory Visit: Payer: Medicare Other | Admitting: Internal Medicine

## 2023-01-09 VITALS — BP 119/65 | HR 56 | Ht 60.0 in | Wt 105.2 lb

## 2023-01-09 DIAGNOSIS — R55 Syncope and collapse: Secondary | ICD-10-CM

## 2023-01-09 DIAGNOSIS — Q211 Atrial septal defect, unspecified: Secondary | ICD-10-CM

## 2023-01-09 NOTE — Progress Notes (Unsigned)
Follow up visit  Subjective:   Yvonne Tyler, female    DOB: Apr 10, 1956, 67 y.o.   MRN: [id number]   HPI  No chief complaint on file.   67 y.o. African American female with Down's syndrome, blindness, vit B12 deficiency, ASD, recurrent syncope.  Patient is doing well. She has not had any recurrent syncope episodes. According to her cousin and legal guardian Yvonne Tyler, she has had some confusion issues, but no syncope.   Initial consultation visit 02/2022: Patient is here today with her cousin and legal guardian Yvonne Tyler.  Patient was recently in [facility] after an episode of syncope that occurred while outside at a splash pad during a hot day. She had just eaten something, had been out in the sun for about 30 min around 11:30 AM. She suddenly slumped over and became unresponsive. She had generalized stiffness, but no obvious shaking. She lost her bladder and bowel control. She was made to lay down flat by her family, she regained consciousness in a few min, briefly lost it again, before regaining it again.Similar episode happened in 08/2021 after having had a large meal. Patient was found to be dehydrated with mild AKI. EEG did not show seizures while in the hospital. Echocardiogram showed new finding of an ASD. She was then seen by Dr. Jacinto Halim. It was felt that ASD was likely an incidental finding in a Down's patient, but not the cause of the syncope. Given that she is asymptomatic from the ASD and that repair would not necessarily make an quality of life change, no further workup was recommended for ASD. Loop recorder was discussed.  Patient has not had any recurrent syncope since then. Today, Yvonne Tyler, her mother, and I discussed further re: the same- details below.   Cardiovascular & other pertient studies:  Reviewed external labs and tests, independently interpreted  EKG 07/26/2022: Sinus rhythm 54 bpm RSR(V1) -nondiagnostic Left atrial  enlargement Nonspecific T-abnormality  Echocardiogram 03/13/2022:  1. Left ventricular ejection fraction, by estimation, is 55 to 60%. The  left ventricle has normal function. The left ventricle has no regional  wall motion abnormalities. There is mild left ventricular hypertrophy.  Left ventricular diastolic parameters  are indeterminate.   2. Right ventricular systolic function is normal. The right ventricular  size is moderately enlarged.   3. On image 79, concern for significant left to right shunt by color  doppler, concerning for ASD. Evidence of atrial level shunting detected by  color flow Doppler. There is a large secundum atrial septal defect with  predominantly left to right shunting  across the atrial septum.   4. Right atrial size was moderately dilated.   5. A small pericardial effusion is present. The pericardial effusion is  circumferential. There is no evidence of cardiac tamponade.   6. The mitral valve is normal in structure. Trivial mitral valve  regurgitation. No evidence of mitral stenosis.   7. The aortic valve is tricuspid. Aortic valve regurgitation is not  visualized. No aortic stenosis is present.   8. The inferior vena cava is normal in size with greater than 50%  respiratory variability, suggesting right atrial pressure of 3 mmHg.   Comparison(s): Prior images unable to be directly viewed, comparison made  by report only. Changes from prior study are noted.   Conclusion(s)/Recommendation(s): Moderate pericardial effusion without  tamponade. Moderate to severely dilated RV (appears larger than LV in  several views). TR inadequate to assess right sided pressures. Large ASD  with  left to right flow. Findings  reported to Dr. Manson Passey and family medicine teaching service.   Recent labs: 03/17/2022: Glucose 83, BUN/Cr 15/0.9. EGFR 71. Na/K 140/4.2.  H/H 12/37. MCV 109. Platelets 187   Review of Systems  Unable to perform ROS: Patient nonverbal   Cardiovascular:  Negative for chest pain, dyspnea on exertion, leg swelling, palpitations and syncope.        There were no vitals filed for this visit.  No data found.    Objective:   Physical Exam Vitals and nursing note reviewed.  Constitutional:      General: She is not in acute distress. Neck:     Vascular: No JVD.  Cardiovascular:     Rate and Rhythm: Normal rate and regular rhythm.     Heart sounds: Murmur heard.     Blowing holosystolic murmur is present with a grade of 2/6 at the upper right sternal border.  Pulmonary:     Effort: Pulmonary effort is normal.     Breath sounds: Normal breath sounds. No wheezing or rales.             Visit diagnoses: No diagnosis found.    No orders of the defined types were placed in this encounter.     Assessment & Recommendations:    67 y.o. African American female with Down's syndrome, blindness, vit B12 deficiency, ASD, recurrent syncope.  No recurrent syncope.  In 03/2022, I had a long discussion with patient's legal guardians Yvonne Tyler and her mother.  As discussed previously, he is to use any incidental finding and unlikely to be the cause of syncope.  Also, unlikely to benefit from ASD repair at age 73 in a Down syndrome patient without symptoms.  Her episode of loss of consciousness could either be due to syncope or seizure.  We discussed rhythm monitoring, through either external monitor or loop recorder.  Family is convinced that the patient will play with the external monitor and would not stay on for long.  Furthermore, we discussed about the goal of a loop recorder will be as a diagnostic procedure.  Should we identify any rhythm problems, including heart block, pacemaker may be the next step.  However, they would not like to proceed with treatment options such as pacemaker.  In that case, I do not see any benefit with loop recorder.  Overall, my suspicion for vasovagal syncope remains high.  Encourage  hydration, consider compression stockings.  In case of prodromal symptoms of vasovagal syncope, recommend supine position, rapid hydration.  Yvonne Tyler and her mother were in agreement with this decision.  F/u in 6 months     Clotilde Dieter, DO Pager: [phone] Office: [phone]

## 2023-01-24 ENCOUNTER — Ambulatory Visit: Payer: Medicare Other | Admitting: Cardiology

## 2023-02-19 ENCOUNTER — Encounter: Payer: Self-pay | Admitting: Podiatry

## 2023-02-19 ENCOUNTER — Ambulatory Visit (INDEPENDENT_AMBULATORY_CARE_PROVIDER_SITE_OTHER): Payer: Medicare Other | Admitting: Podiatry

## 2023-02-19 DIAGNOSIS — B351 Tinea unguium: Secondary | ICD-10-CM | POA: Diagnosis not present

## 2023-02-19 DIAGNOSIS — M79674 Pain in right toe(s): Secondary | ICD-10-CM

## 2023-02-19 NOTE — Progress Notes (Signed)
Complaint:  Visit Type: Patient returns to my office for continued preventative foot care services. Complaint: Patient states" my nails have grown long and thick and become painful to walk and wear shoes"  The patient presents for preventative foot care services. No changes to ROS.  Patient presents to the office with her caregiver..  Podiatric Exam: Vascular: dorsalis pedis and posterior tibial pulses are palpable bilateral. Capillary return is immediate. Temperature gradient is WNL. Skin turgor WNL  Sensorium: Normal Semmes Weinstein monofilament test. Normal tactile sensation bilaterally. Nail Exam: Pt has thick disfigured discolored nails with subungual debris noted bilateral entire nail hallux through fifth toenails,  Especially right great toenail. Ulcer Exam: There is no evidence of ulcer or pre-ulcerative changes or infection. Orthopedic Exam: Muscle tone and strength are WNL. No limitations in general ROM. No crepitus or effusions noted. HAV  B/L.  Hammer toes  B/L. Skin: No Porokeratosis. No infection or ulcers  Diagnosis:  Onychomycosis, , Pain in right toe, pain in left toes  Treatment & Plan Procedures and Treatment: Consent by patient was obtained for treatment procedures.   Debridement of mycotic and hypertrophic toenails, 1 through 5 bilateral and clearing of subungual debris. No ulceration, no infection noted.  Return Visit-Office Procedure: Patient instructed to return to the office for a follow up visit  3   months for continued evaluation and treatment.    Kamree Wiens DPM 

## 2023-03-19 ENCOUNTER — Telehealth: Payer: Self-pay

## 2023-03-19 NOTE — Telephone Encounter (Signed)
Patients guardian calls nurse line requesting an apt.   She reports the patient has been experiencing intermittent dizzy spells with one episode of syncope.   She has been seen by Cardiology for symptoms most recently in May.   She reports she fell this morning making her bed. She reports she told her she just became dizzy and lost her balance. 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   She denies any injury and denies hitting her head. She denies any chest pains or SOB.  She reports her blood pressures have been "good." She did not have any readings to share.   Advised to monitor patient and encourage fluids. Advised they can reach out to Cardiology as well.   Patient scheduled for Wednesday morning for evaluation.   Strict ED precautions given.

## 2023-03-21 ENCOUNTER — Ambulatory Visit (INDEPENDENT_AMBULATORY_CARE_PROVIDER_SITE_OTHER): Payer: Medicare Other | Admitting: Student

## 2023-03-21 ENCOUNTER — Ambulatory Visit (HOSPITAL_COMMUNITY)
Admission: RE | Admit: 2023-03-21 | Discharge: 2023-03-21 | Disposition: A | Discharge status: Home / Self Care | Payer: Medicare Other | Source: Ambulatory Visit | Attending: Family Medicine | Admitting: Family Medicine

## 2023-03-21 VITALS — BP 108/62 | HR 56 | Ht 60.0 in | Wt 109.8 lb

## 2023-03-21 DIAGNOSIS — R55 Syncope and collapse: Secondary | ICD-10-CM | POA: Diagnosis present

## 2023-03-21 NOTE — Progress Notes (Signed)
  SUBJECTIVE:   CHIEF COMPLAINT / HPI:   Syncope Was sitting on the toilet, got confused and couldn't pull her pants up, ended up having to lean against the sink. That lasted for a few minutes. Provider's mother notes that she was going in/out, got dizzy and could hardly walk. She notes that she couldn't hardly get up and walk, and ended up falling. It took her a couple of minutes to get her up.      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   Vitamin B12 deficiency 01/16/2020    Patient Care Team: Yvonne Martinez, MD as PCP - General (Family Medicine) OBJECTIVE:  BP 108/62   Pulse (!) 56   Ht 5' (1.524 m)   Wt 109 lb 12.8 oz (49.8 kg)   LMP  (LMP Unknown) Comment: Mentaly challenged  SpO2 97%   BMI 21.44 kg/m  Physical Exam Constitutional:      General: She is not in acute distress.    Appearance: Normal appearance. She is not ill-appearing.  Cardiovascular:     Rate and Rhythm: Normal rate and regular rhythm.     Pulses: Normal pulses.     Heart sounds: Normal heart sounds. No murmur heard.    No friction rub. No gallop.  Pulmonary:     Effort: Pulmonary effort is normal. No respiratory distress.     Breath sounds: No stridor. No wheezing, rhonchi or rales.  Neurological:     Mental Status: She is alert.     Comments: Patient blind and w/ cognitive impairment, thus unable to cooperate with neuro exam. Strength 5/5 in UE and LE grossly (shrug, push/pull, kick out)  Psychiatric:        Mood and Affect: Mood normal.      ASSESSMENT/PLAN:  Syncope, unspecified syncope type -     EKG 12-Lead -     Basic metabolic panel -     CBC -     MR BRAIN WO  CONTRAST; Future  Syncope and collapse Assessment & Plan: Patient comes in for presyncopal, and syncopal event.  Patient noted to be wobbly and disoriented prior to event, with disorientation after syncope.  Presentation most concerning for vasovagal syncope versus seizure, cannot exclude arrhythmia.  EKG in office today normal, patient with asymptomatic bradycardia.  Patient recently seen by cardiology, but had had no evidence.  Will have patient follow-up with cardiology.  Will also check labs for anemia/electrolyte imbalance.  Patient caregiver also complaining of staring spells, short-lived, however combined with syncopal episodes, I have some concern for seizure, will also make referral to neurology. Patient unable to cooperate with neuro examine.  -CBC -BMP -Amb Reff Neuro -F/u w/ Cardiologist   Other orders -     EKG 12-Lead   No follow-ups on file. Yvonne Kinds, MD 03/23/2023, 10:54 PM PGY-3, [facility]

## 2023-03-21 NOTE — Patient Instructions (Addendum)
It was great to see you! Thank you for allowing me to participate in your care!   Our plans for today:  Dizzy/Passing out - We are unsure what is caused her to fall/ having some dizziness. It could be related to her heart. We will have you follow up with her heart doctor. Also checking some blood work for a cause.  -CBC, BMP -Follow up with your Cardiologist (Heart Doctor) [facility] Elder Negus, MD Office: [phone]  - Starring spells -We are sending you to the Neurologist (Brain doctors) to  investigate what could be causing these starring spells.  -This could be related to her cognitive impairment worsening.  -We are also sending you to get imaging of your brain, an MRI. Someone will call you with the details    Take care and seek immediate care sooner if you develop any concerns.   Dr. [name], MD [facility]

## 2023-03-22 LAB — CBC
Hematocrit: 38.6 % (ref 34.0–46.6)
Hemoglobin: 13.1 g/dL (ref 11.1–15.9)
MCH: 35 pg — ABNORMAL HIGH (ref 26.6–33.0)
MCHC: 33.9 g/dL (ref 31.5–35.7)
MCV: 103 fL — ABNORMAL HIGH (ref 79–97)
Platelets: 204 10*3/uL (ref 150–450)
RBC: 3.74 x10E6/uL — ABNORMAL LOW (ref 3.77–5.28)
RDW: 11.8 % (ref 11.7–15.4)
WBC: 4.2 10*3/uL (ref 3.4–10.8)

## 2023-03-22 LAB — BASIC METABOLIC PANEL
BUN/Creatinine Ratio: 15 (ref 12–28)
CO2: 26 mmol/L (ref 20–29)
Calcium: 8.7 mg/dL (ref 8.7–10.3)
Creatinine, Ser: 0.84 mg/dL (ref 0.57–1.00)
Glucose: 93 mg/dL (ref 70–99)
Sodium: 139 mmol/L (ref 134–144)
eGFR: 76 mL/min/{1.73_m2} (ref >59)

## 2023-03-23 NOTE — Assessment & Plan Note (Signed)
Patient comes in for presyncopal, and syncopal event.  Patient noted to be wobbly and disoriented prior to event, with disorientation after syncope.  Presentation most concerning for vasovagal syncope versus seizure, cannot exclude arrhythmia.  EKG in office today normal, patient with asymptomatic bradycardia.  Patient recently seen by cardiology, but had had no evidence.  Will have patient follow-up with cardiology.  Will also check labs for anemia/electrolyte imbalance.  Patient caregiver also complaining of staring spells, short-lived, however combined with syncopal episodes, I have some concern for seizure, will also make referral to neurology. Patient unable to cooperate with neuro examine.  -CBC -BMP -Amb Reff Neuro -F/u w/ Cardiologist

## 2023-03-27 ENCOUNTER — Telehealth: Payer: Self-pay

## 2023-03-27 NOTE — Telephone Encounter (Signed)
Called and spoke with patient's caregiver, [name], to provide appointment details.   MRI has been scheduled at [facility] on Friday, August 9th at 11:00 am with 10:30 arrival time. Entrance A.  Veronda Prude, RN

## 2023-04-06 ENCOUNTER — Ambulatory Visit (HOSPITAL_COMMUNITY)
Admission: RE | Admit: 2023-04-06 | Discharge: 2023-04-06 | Disposition: A | Discharge status: Home / Self Care | Payer: Medicare Other | Source: Ambulatory Visit | Attending: Family Medicine | Admitting: Family Medicine

## 2023-04-06 DIAGNOSIS — R55 Syncope and collapse: Secondary | ICD-10-CM | POA: Diagnosis present

## 2023-04-10 ENCOUNTER — Encounter: Payer: Self-pay | Admitting: Family Medicine

## 2023-04-20 ENCOUNTER — Other Ambulatory Visit: Payer: Self-pay | Admitting: Student

## 2023-04-20 DIAGNOSIS — R55 Syncope and collapse: Secondary | ICD-10-CM

## 2023-04-20 NOTE — Progress Notes (Signed)
Called and spoke with [name], Legal Guardian, and her mother, who take care of patient. They note that patient is still having some walking issues, but has not fallen/hit her head. Informed them of the chronic brain bleeds that were noted and that we would be referring patient to Neurology. Also discussed taking patient to ED if symptoms worsen, or becomes disoriented.  Referral Placed

## 2023-05-09 ENCOUNTER — Ambulatory Visit (INDEPENDENT_AMBULATORY_CARE_PROVIDER_SITE_OTHER): Payer: Medicare Other | Admitting: Neurology

## 2023-05-09 ENCOUNTER — Encounter: Payer: Self-pay | Admitting: Neurology

## 2023-05-09 VITALS — BP 122/70 | HR 60 | Ht 59.0 in | Wt 112.0 lb

## 2023-05-09 DIAGNOSIS — G40909 Epilepsy, unspecified, not intractable, without status epilepticus: Secondary | ICD-10-CM | POA: Insufficient documentation

## 2023-05-09 DIAGNOSIS — Q909 Down syndrome, unspecified: Secondary | ICD-10-CM

## 2023-05-09 MED ORDER — DIVALPROEX SODIUM ER 500 MG PO TB24: 500.0000 mg | ORAL_TABLET | Freq: Every day | ORAL | 11 refills | Status: DC

## 2023-05-09 NOTE — Progress Notes (Signed)
Chief Complaint  Patient presents with   New Patient (Initial Visit)    Rmemg3, Yvonne Tyler cousin caretaker present NP internal referral for Syncope and collapse last one was July of 2023, caretaker also mentioned memory concerns      ASSESSMENT AND PLAN  Yvonne Tyler is a 67 y.o. female   Down's syndrome Dementia Seizure  Semiology of loss consciousness spells are most consistent with seizure,  Significant abnormal MRI of the brain, high risk for seizure  EEG  Starting Depakote ER 500 mg every night  Lab evaluation in 3 months  Return To Clinic With NP In 12 Months call clinic for recurrent spells  DIAGNOSTIC DATA (LABS, IMAGING, TESTING) - I reviewed patient records, labs, notes, testing and imaging myself where available.   MEDICAL HISTORY:  Yvonne Tyler is a 67 year old female, accompanied by [name], who is also her caregiver seen in request by   her primary care doctor Westley Chandler for evaluation of passing out episode, initial evaluation May 09, 2023  I reviewed and summarized the referring note. PMHX Down syndrome Blindness  HLD Circadian rhythm   Yvonne Tyler is her first degree maternal cousin, known [place] all her life, she lost her vision many years ago, there was reported a history of cataract, she is pleasant, like self mumbling,  Over the years, she has developed gradual onset of memory loss, got lost in her own room  She has no history of seizure, but since 2022, she began to have spells of sudden onset of loss of consciousness, body stiff up, sometimes with bladder incontinence, each episode last for few minutes, rarely happen, couple times each year  Personally reviewed MRI of the brain without contrast from April 17, 2023, moderate small vessel disease, multi foci of chronic cerebral hemorrhage, involving right greater than left anterior frontal lobe, with encephalomalacia at posterior left temporal, also evidence of  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  PHYSICAL EXAMNIATION:  Gen: NAD, conversant, well nourised, well groomed                     Cardiovascular: Regular rate rhythm, no peripheral edema, warm, nontender. Eyes: Conjunctivae clear without exudates or hemorrhage Neck: Supple, no carotid bruits. Pulmonary: Clear to auscultation bilaterally   NEUROLOGICAL EXAM:  MENTAL STATUS: Speech/cognition: Self mumbling, compliant with examination, CRANIAL NERVES: CN II: Opaque left pupil, totally blind CN III, IV, VI: extraocular movement are normal. No ptosis. CN V: Facial sensation is intact to light touch CN VII: Face is symmetric with normal eye closure  CN VIII: Hearing is normal to causal conversation. CN IX, X: Phonation is normal. CN XI: Head turning and shoulder shrug are intact  MOTOR: Moving 4 extremities according to command  REFLEXES: Reflexes are present and symmetric  SENSORY: Withdraws to pain  COORDINATION: There is no trunk or limb dysmetria noted.  GAIT/STANCE: Need help getting up from seated position, cautious unsteady, also limited by her blindness  REVIEW OF SYSTEMS:  Full 14 system review of systems performed and notable only for as above All other review of systems were negative.   ALLERGIES: No  Known Allergies  HOME MEDICATIONS: Current Outpatient Medications  Medication Sig Dispense Refill   melatonin 1 MG TABS tablet Take 1 tablet (1 mg total) by mouth at bedtime. (Patient taking differently: Take 1 mg by mouth as needed.) 30 tablet 1   No current facility-administered medications for this visit.    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   Vitamin B12 deficiency 01/16/2020    PAST SURGICAL HISTORY: No past surgical history on file.  FAMILY HISTORY: Family History  Problem Relation Age of Onset   Colon cancer Neg Hx    Esophageal cancer Neg Hx    Rectal cancer Neg Hx    Prostate cancer Neg Hx    Breast cancer Neg Hx     SOCIAL HISTORY: Social History   Socioeconomic History   Marital status: Single    Spouse name: Not on file   Number of children: 0   Years of education: Not on file   Highest education level: Not on file  Occupational History   Not on file  Tobacco Use   Smoking status: Never   Smokeless tobacco: Never  Vaping Use   Vaping status: Never Used  Substance and Sexual Activity   Alcohol use: Yes    Comment: Beer once a year on her birthday.   Drug use: No   Sexual activity: Never  Other Topics Concern   Not on file  Social History Narrative       Patient is legally blind and has diagnosis of down Syndrome.  lives with aunt Yvonne Tyler and cousin since mother passed away as a teenager. Patient very independent of ADL's.     Family moved to [facility] from [place] about 15 years ago.  Patient not participated in any day programs since moving to [facility].  She participated in day programs in [place] but no longer wants to go.    Patient has a doll that she enjoys, she also enjoys talking and Riding in the car. Primary family support persons is cousin Yvonne Tyler. Transportation to appointments provided by cousin Yvonne Tyler, who is her primary caretaker.   Patient does not have a legal guardian.         Social Determinants of Health   Financial Resource Strain: Not on file  Food  Insecurity: No Food Insecurity (01/08/2020)   Hunger Vital Sign    Worried About Running Out of Food in the Last Year: Never true    Ran Out of Food in the Last Year: Never true  Transportation Needs: No Transportation Needs (01/08/2020)   PRAPARE - Administrator, Civil Service (Medical): No    Lack of Transportation (Non-Medical): No  Physical Activity: Not on file  Stress: Not on file  Social Connections: Not on file  Intimate Partner Violence: Not on file      Levert Feinstein, M.D. Ph.D.  [facility] [address], [address] [place], [place] [address] Ph: [phone] Fax: [phone]  CC:  Westley Chandler, MD [address] [place],  [place] [address]  Alfredo Martinez, MD

## 2023-05-15 ENCOUNTER — Ambulatory Visit: Payer: Medicare Other | Admitting: *Deleted

## 2023-05-15 DIAGNOSIS — Z0000 Encounter for general adult medical examination without abnormal findings: Secondary | ICD-10-CM

## 2023-05-15 NOTE — Progress Notes (Signed)
Subjective:   Yvonne Tyler is a 67 y.o. female who presents for an Initial Medicare Annual Wellness Visit.  Visit Complete: Virtual  I connected with  Nija Jerline Pain patient care giver answered questions for patient on 05/15/23 by a audio enabled telemedicine application and verified that I am speaking with the correct person using two identifiers.  Patient Location: Home  Provider Location: Home Office  I discussed the limitations of evaluation and management by telemedicine. The patient expressed understanding and agreed to proceed.  Vital Signs: Unable to obtain new vitals due to this being a telehealth visit.   Cardiac Risk Factors include: advanced age (>50men, >42 women)     Objective:    There were no vitals filed for this visit. There is no height or weight on file to calculate BMI.     05/15/2023    2:35 PM 07/12/2022   11:19 AM 04/14/2022    9:44 AM 03/17/2022   10:30 AM 09/07/2021   11:09 AM 08/29/2021    6:00 PM 08/15/2021    2:46 PM  Advanced Directives  Does Patient Have a Medical Advance Directive? No No No No No No No  Would patient like information on creating a medical advance directive? No - Patient declined No - Patient declined No - Patient declined No - Guardian declined No - Patient declined No - Guardian declined;Yes (Inpatient - patient requests chaplain consult to create a medical advance directive)     Current Medications (verified) Outpatient Encounter Medications as of 05/15/2023  Medication Sig   divalproex (DEPAKOTE ER) 500 MG 24 hr tablet Take 1 tablet (500 mg total) by mouth at bedtime.   melatonin 1 MG TABS tablet Take 1 tablet (1 mg total) by mouth at bedtime. (Patient taking differently: Take 1 mg by mouth as needed.)   No facility-administered encounter medications on file as of 05/15/2023.    Allergies (verified) Patient has no known allergies.   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   Vitamin B12 deficiency 01/16/2020   History reviewed. No pertinent surgical history. Family History  Problem Relation Age of Onset   Colon cancer Neg Hx    Esophageal cancer Neg Hx    Rectal cancer Neg Hx    Prostate cancer Neg Hx    Breast cancer Neg Hx    Social History   Socioeconomic History   Marital status: Single    Spouse name: Not on file   Number of children: 0   Years of education: Not on file   Highest education level: Not on file  Occupational History   Not on file  Tobacco Use   Smoking status: Never   Smokeless tobacco: Never  Vaping Use   Vaping status: Never Used  Substance and Sexual Activity   Alcohol use: Yes    Comment: Beer once a year on her birthday.   Drug use: No   Sexual activity: Never  Other Topics Concern   Not on file  Social History Narrative       Patient is legally blind and has diagnosis of down Syndrome.  lives with aunt Ailene and  cousin since mother passed away as a teenager. Patient very independent of ADL's.     Family moved to [facility] from [place] about 15 years ago.  Patient not participated in any day programs since moving to [facility].  She participated in day programs in [place] but no longer wants to go.    Patient has a doll that she enjoys, she also enjoys talking and Riding in the car. Primary family support persons is cousin [name]. Transportation to appointments provided by cousin [name], who is her primary caretaker.   Patient does not have a legal guardian.         Social Determinants of Health   Financial Resource Strain: Low Risk  (05/15/2023)   Overall Financial Resource Strain (CARDIA)    Difficulty of Paying Living Expenses: Not hard at all   Food Insecurity: No Food Insecurity (05/15/2023)   Hunger Vital Sign    Worried About Running Out of Food in the Last Year: Never true    Ran Out of Food in the Last Year: Never true  Transportation Needs: No Transportation Needs (05/15/2023)   PRAPARE - Administrator, Civil Service (Medical): No    Lack of Transportation (Non-Medical): No  Physical Activity: Inactive (05/15/2023)   Exercise Vital Sign    Days of Exercise per Week: 0 days    Minutes of Exercise per Session: 0 min  Stress: No Stress Concern Present (05/15/2023)   [employer] of Occupational Health - Occupational Stress Questionnaire    Feeling of Stress : Not at all  Social Connections: Moderately Isolated (05/15/2023)   Social Connection and Isolation Panel [NHANES]    Frequency of Communication with Friends and Family: Never    Frequency of Social Gatherings with Friends and Family: More than three times a week    Attends Religious Services: Never    Database administrator or Organizations: Yes    Attends Engineer, structural: More than 4 times per year    Marital Status: Never married    Tobacco Counseling Counseling given: Not Answered   Clinical Intake:  Pre-visit preparation completed: Yes  Pain : No/denies pain     Diabetes: No  How often do you need to have someone help you when you read instructions, pamphlets, or other written materials from your doctor or pharmacy?: 5 - Always  Interpreter Needed?: No  Information entered by :: Remi Haggard LPN   Activities of Daily Living    05/15/2023    2:36 PM  In your present state of health, do you have any difficulty performing the following activities:  Hearing? 0  Vision? 1  Walking or climbing stairs? 0  Dressing or bathing? 0  Doing errands, shopping? 1  Preparing Food and eating ? N  Using the Toilet? N  In the past six months, have you accidently leaked urine? N  Do you have problems with loss of bowel control? N   Managing your Finances? Y  Housekeeping or managing your Housekeeping? N    Patient Care Team: Alfredo Martinez, MD as PCP - General (Family Medicine)  Indicate any recent Medical Services you may have received from other than [facility] providers in the past year (date may be approximate).     Assessment:   This is a routine wellness examination for Yvonne Tyler.  Hearing/Vision screen Hearing Screening - Comments:: No trouble hearing Vision Screening - Comments:: Up to date [place] unsure of name   Goals Addressed   None  Depression Screen    05/15/2023    2:38 PM 03/21/2023    8:37 AM 07/12/2022   11:19 AM 04/14/2022    9:43 AM 03/17/2022   10:33 AM 09/07/2021   11:09 AM 12/31/2019   10:54 AM  PHQ 2/9 Scores  PHQ - 2 Score 0 0  0 0 0 0  PHQ- 9 Score 2 2  0 0    Exception Documentation   Patient refusal        Fall Risk    05/15/2023    2:35 PM 05/09/2023    2:31 PM 07/12/2022   11:19 AM 04/14/2022    9:44 AM 03/17/2022   10:34 AM  Fall Risk   Falls in the past year? 0 1 0 0   Number falls in past yr: 0 0 0 0 1  Comment     Patient has seizures  Injury with Fall? 0 0 0 0 0  Risk for fall due to :  Impaired vision;Impaired mobility     Follow up Falls evaluation completed;Education provided;Falls prevention discussed   Falls evaluation completed     MEDICARE RISK AT HOME: Medicare Risk at Home Any stairs in or around the home?: No If so, are there any without handrails?: No Home free of loose throw rugs in walkways, pet beds, electrical cords, etc?: Yes Adequate lighting in your home to reduce risk of falls?: Yes Life alert?: No Use of a cane, walker or w/c?: No Grab bars in the bathroom?: No Shower chair or bench in shower?: No Elevated toilet seat or a handicapped toilet?: No  TIMED UP AND GO:  Was the test performed? No    Cognitive Function:  Patient has a caregiver Unable to perform test.          Immunizations Immunization History  Administered Date(s)  Administered   Fluad Quad(high Dose 65+) 07/12/2022   Influenza Split 06/19/2011, 05/09/2012   Influenza Whole 05/30/2004   Influenza,inj,Quad PF,6+ Mos 07/08/2013, 05/06/2019, 09/07/2021   PFIZER Comirnaty(Gray Top)Covid-19 Tri-Sucrose Vaccine 02/25/2020, 03/17/2020   Pfizer Covid-19 Vaccine Bivalent Booster 57yrs & up 09/07/2021   Pneumococcal Polysaccharide-23 08/30/2021   Tdap 05/09/2012    TDAP status: Due, Education has been provided regarding the importance of this vaccine. Advised may receive this vaccine at local pharmacy or Health Dept. Aware to provide a copy of the vaccination record if obtained from local pharmacy or Health Dept. Verbalized acceptance and understanding.  Flu Vaccine status: Due, Education has been provided regarding the importance of this vaccine. Advised may receive this vaccine at local pharmacy or Health Dept. Aware to provide a copy of the vaccination record if obtained from local pharmacy or Health Dept. Verbalized acceptance and understanding.  Pneumococcal vaccine status: Up to date  Covid-19 vaccine status: Information provided on how to obtain vaccines.   Qualifies for Shingles Vaccine? Yes   Zostavax completed No   Shingrix Completed?: No.    Education has been provided regarding the importance of this vaccine. Patient has been advised to call insurance company to determine out of pocket expense if they have not yet received this vaccine. Advised may also receive vaccine at local pharmacy or Health Dept. Verbalized acceptance and understanding.  Screening Tests Health Maintenance  Topic Date Due   Medicare Annual Wellness (AWV)  Never done   Zoster Vaccines- Shingrix (1 of 2) 08/14/2023 (Originally 02/20/2006)   INFLUENZA VACCINE  11/26/2023 (Originally 03/29/2023)   Pneumonia Vaccine 64+ Years old (2 of 2 - PCV)  05/14/2024 (Originally 08/30/2022)   COVID-19 Vaccine (4 - 2023-24 season) 05/14/2024 (Originally 04/29/2023)   MAMMOGRAM  11/15/2024    Colonoscopy  02/15/2030   DEXA SCAN  Completed   Hepatitis C Screening  Completed   HPV VACCINES  Aged Out   DTaP/Tdap/Td  Discontinued    Health Maintenance  Health Maintenance Due  Topic Date Due   Medicare Annual Wellness (AWV)  Never done    Colorectal cancer screening: Type of screening: Colonoscopy. Completed 2021. Repeat every 10 years  Mammogram status: Completed  . Repeat every year  Bone Density  discussed will schedule next year  Lung Cancer Screening: (Low Dose CT Chest recommended if Age 41-80 years, 20 pack-year currently smoking OR have quit w/in 15years.) does not qualify.   Lung Cancer Screening Referral:   Additional Screening:  Hepatitis C Screening: does not qualify; Completed 2018  Vision Screening: Recommended annual ophthalmology exams for early detection of glaucoma and other disorders of the eye. Is the patient up to date with their annual eye exam?  Yes  Who is the provider or what is the name of the office in which the patient attends annual eye exams? Unsure of name If pt is not established with a provider, would they like to be referred to a provider to establish care? No .   Dental Screening: Recommended annual dental exams for proper oral hygiene    Community Resource Referral / Chronic Care Management: CRR required this visit?  No   CCM required this visit?  No     Plan:     I have personally reviewed and noted the following in the patient's chart:   Medical and social history Use of alcohol, tobacco or illicit drugs  Current medications and supplements including opioid prescriptions. Patient is not currently taking opioid prescriptions. Functional ability and status Nutritional status Physical activity Advanced directives List of other physicians Hospitalizations, surgeries, and ER visits in previous 12 months Vitals Screenings to include cognitive, depression, and falls Referrals and appointments  In addition, I have  reviewed and discussed with patient certain preventive protocols, quality metrics, and best practice recommendations. A written personalized care plan for preventive services as well as general preventive health recommendations were provided to patient.     Remi Haggard, LPN   2/84/1324   After Visit Summary: (MyChart) Due to this being a telephonic visit, the after visit summary with patients personalized plan was offered to patient via MyChart   Nurse Notes:

## 2023-05-15 NOTE — Patient Instructions (Signed)
Yvonne Tyler , Thank you for taking time to come for your Medicare Wellness Visit. I appreciate your ongoing commitment to your health goals. Please review the following plan we discussed and let me know if I can assist you in the future.   Screening recommendations/referrals: Colonoscopy: up to date Mammogram: up to date Bone Density: Education provided Recommended yearly ophthalmology/optometry visit for glaucoma screening and checkup Recommended yearly dental visit for hygiene and checkup  Vaccinations: Influenza vaccine: Education provided Pneumococcal vaccine: up to date Tdap vaccine: Education provided Shingles vaccine: Education provided    Advanced directives: Educatioin provided     Preventive Care 65 Years and Older, Female Preventive care refers to lifestyle choices and visits with your health care provider that can promote health and wellness. What does preventive care include? A yearly physical exam. This is also called an annual well check. Dental exams once or twice a year. Routine eye exams. Ask your health care provider how often you should have your eyes checked. Personal lifestyle choices, including: Daily care of your teeth and gums. Regular physical activity. Eating a healthy diet. Avoiding tobacco and drug use. Limiting alcohol use. Practicing safe sex. Taking low-dose aspirin every day. Taking vitamin and mineral supplements as recommended by your health care provider. What happens during an annual well check? The services and screenings done by your health care provider during your annual well check will depend on your age, overall health, lifestyle risk factors, and family history of disease. Counseling  Your health care provider may ask you questions about your: Alcohol use. Tobacco use. Drug use. Emotional well-being. Home and relationship well-being. Sexual activity. Eating habits. History of falls. Memory and ability to understand  (cognition). Work and work Astronomer. Reproductive health. Screening  You may have the following tests or measurements: Height, weight, and BMI. Blood pressure. Lipid and cholesterol levels. These may be checked every 5 years, or more frequently if you are over 74 years old. Skin check. Lung cancer screening. You may have this screening every year starting at age 54 if you have a 30-pack-year history of smoking and currently smoke or have quit within the past 15 years. Fecal occult blood test (FOBT) of the stool. You may have this test every year starting at age 27. Flexible sigmoidoscopy or colonoscopy. You may have a sigmoidoscopy every 5 years or a colonoscopy every 10 years starting at age 41. Hepatitis C blood test. Hepatitis B blood test. Sexually transmitted disease (STD) testing. Diabetes screening. This is done by checking your blood sugar (glucose) after you have not eaten for a while (fasting). You may have this done every 1-3 years. Bone density scan. This is done to screen for osteoporosis. You may have this done starting at age 65. Mammogram. This may be done every 1-2 years. Talk to your health care provider about how often you should have regular mammograms. Talk with your health care provider about your test results, treatment options, and if necessary, the need for more tests. Vaccines  Your health care provider may recommend certain vaccines, such as: Influenza vaccine. This is recommended every year. Tetanus, diphtheria, and acellular pertussis (Tdap, Td) vaccine. You may need a Td booster every 10 years. Zoster vaccine. You may need this after age 6. Pneumococcal 13-valent conjugate (PCV13) vaccine. One dose is recommended after age 56. Pneumococcal polysaccharide (PPSV23) vaccine. One dose is recommended after age 74. Talk to your health care provider about which screenings and vaccines you need and how often you need  them. This information is not intended to  replace advice given to you by your health care provider. Make sure you discuss any questions you have with your health care provider. Document Released: 09/10/2015 Document Revised: 05/03/2016 Document Reviewed: 06/15/2015 Elsevier Interactive Patient Education  2017 [employer].  Fall Prevention in the Home Falls can cause injuries. They can happen to people of all ages. There are many things you can do to make your home safe and to help prevent falls. What can I do on the outside of my home? Regularly fix the edges of walkways and driveways and fix any cracks. Remove anything that might make you trip as you walk through a door, such as a raised step or threshold. Trim any bushes or trees on the path to your home. Use bright outdoor lighting. Clear any walking paths of anything that might make someone trip, such as rocks or tools. Regularly check to see if handrails are loose or broken. Make sure that both sides of any steps have handrails. Any raised decks and porches should have guardrails on the edges. Have any leaves, snow, or ice cleared regularly. Use sand or salt on walking paths during winter. Clean up any spills in your garage right away. This includes oil or grease spills. What can I do in the bathroom? Use night lights. Install grab bars by the toilet and in the tub and shower. Do not use towel bars as grab bars. Use non-skid mats or decals in the tub or shower. If you need to sit down in the shower, use a plastic, non-slip stool. Keep the floor dry. Clean up any water that spills on the floor as soon as it happens. Remove soap buildup in the tub or shower regularly. Attach bath mats securely with double-sided non-slip rug tape. Do not have throw rugs and other things on the floor that can make you trip. What can I do in the bedroom? Use night lights. Make sure that you have a light by your bed that is easy to reach. Do not use any sheets or blankets that are too big for  your bed. They should not hang down onto the floor. Have a firm chair that has side arms. You can use this for support while you get dressed. Do not have throw rugs and other things on the floor that can make you trip. What can I do in the kitchen? Clean up any spills right away. Avoid walking on wet floors. Keep items that you use a lot in easy-to-reach places. If you need to reach something above you, use a strong step stool that has a grab bar. Keep electrical cords out of the way. Do not use floor polish or wax that makes floors slippery. If you must use wax, use non-skid floor wax. Do not have throw rugs and other things on the floor that can make you trip. What can I do with my stairs? Do not leave any items on the stairs. Make sure that there are handrails on both sides of the stairs and use them. Fix handrails that are broken or loose. Make sure that handrails are as long as the stairways. Check any carpeting to make sure that it is firmly attached to the stairs. Fix any carpet that is loose or worn. Avoid having throw rugs at the top or bottom of the stairs. If you do have throw rugs, attach them to the floor with carpet tape. Make sure that you have a light switch at  the top of the stairs and the bottom of the stairs. If you do not have them, ask someone to add them for you. What else can I do to help prevent falls? Wear shoes that: Do not have high heels. Have rubber bottoms. Are comfortable and fit you well. Are closed at the toe. Do not wear sandals. If you use a stepladder: Make sure that it is fully opened. Do not climb a closed stepladder. Make sure that both sides of the stepladder are locked into place. Ask someone to hold it for you, if possible. Clearly mark and make sure that you can see: Any grab bars or handrails. First and last steps. Where the edge of each step is. Use tools that help you move around (mobility aids) if they are needed. These  include: Canes. Walkers. Scooters. Crutches. Turn on the lights when you go into a dark area. Replace any light bulbs as soon as they burn out. Set up your furniture so you have a clear path. Avoid moving your furniture around. If any of your floors are uneven, fix them. If there are any pets around you, be aware of where they are. Review your medicines with your doctor. Some medicines can make you feel dizzy. This can increase your chance of falling. Ask your doctor what other things that you can do to help prevent falls. This information is not intended to replace advice given to you by your health care provider. Make sure you discuss any questions you have with your health care provider. Document Released: 06/10/2009 Document Revised: 01/20/2016 Document Reviewed: 09/18/2014 Elsevier Interactive Patient Education  2017 [employer].

## 2023-05-21 ENCOUNTER — Ambulatory Visit (INDEPENDENT_AMBULATORY_CARE_PROVIDER_SITE_OTHER): Payer: Medicare Other | Admitting: Podiatry

## 2023-05-21 ENCOUNTER — Encounter: Payer: Self-pay | Admitting: Podiatry

## 2023-05-21 DIAGNOSIS — M79674 Pain in right toe(s): Secondary | ICD-10-CM | POA: Diagnosis not present

## 2023-05-21 DIAGNOSIS — B351 Tinea unguium: Secondary | ICD-10-CM

## 2023-05-21 DIAGNOSIS — H548 Legal blindness, as defined in USA: Secondary | ICD-10-CM

## 2023-05-21 NOTE — Progress Notes (Signed)
Complaint:  Visit Type: Patient returns to my office for continued preventative foot care services. Complaint: Patient states" my nails have grown long and thick and become painful to walk and wear shoes"  The patient presents for preventative foot care services. No changes to ROS.  Patient presents to the office with her caregiver..  Podiatric Exam: Vascular: dorsalis pedis and posterior tibial pulses are palpable bilateral. Capillary return is immediate. Temperature gradient is WNL. Skin turgor WNL  Sensorium: Normal Semmes Weinstein monofilament test. Normal tactile sensation bilaterally. Nail Exam: Pt has thick disfigured discolored nails with subungual debris noted bilateral entire nail hallux through fifth toenails,  Especially right great toenail. Ulcer Exam: There is no evidence of ulcer or pre-ulcerative changes or infection. Orthopedic Exam: Muscle tone and strength are WNL. No limitations in general ROM. No crepitus or effusions noted. HAV  B/L.  Hammer toes  B/L. Skin: No Porokeratosis. No infection or ulcers  Diagnosis:  Onychomycosis, , Pain in right toe, pain in left toes  Treatment & Plan Procedures and Treatment: Consent by patient was obtained for treatment procedures.   Debridement of mycotic and hypertrophic toenails, 1 through 5 bilateral and clearing of subungual debris. No ulceration, no infection noted.  Return Visit-Office Procedure: Patient instructed to return to the office for a follow up visit  3   months for continued evaluation and treatment.    Helane Gunther DPM

## 2023-05-22 ENCOUNTER — Other Ambulatory Visit: Payer: Medicare Other | Admitting: *Deleted

## 2023-06-13 ENCOUNTER — Ambulatory Visit (INDEPENDENT_AMBULATORY_CARE_PROVIDER_SITE_OTHER): Payer: Medicare Other | Admitting: Neurology

## 2023-06-13 DIAGNOSIS — G40909 Epilepsy, unspecified, not intractable, without status epilepticus: Secondary | ICD-10-CM

## 2023-06-13 DIAGNOSIS — Q909 Down syndrome, unspecified: Secondary | ICD-10-CM

## 2023-07-07 NOTE — Procedures (Signed)
   HISTORY: 67 year old female, is blind, Down syndrome, presenting with seizure  TECHNIQUE:  This is a routine 16 channel EEG recording with one channel devoted to a limited EKG recording.  It was performed during wakefulness, drowsiness and asleep.  Hyperventilation were performed as activating procedures.  There are frequent bilateral frontal muscle artifact.  Upon maximum arousal, posterior dominant waking rhythm consistent of mildly dysrhythmic theta range activity. Activities are symmetric over the bilateral posterior derivations and attenuated with eye opening.  Hyperventilation produced mild/moderate buildup with higher amplitude and the slower activities noted.  During EEG recording, patient developed drowsiness and no deeper stage of sleep was achieved During EEG recording, there was no epileptiform discharge noted.  EKG demonstrate normal sinus rhythm.  CONCLUSION: This is an abnormal EEG.  There is no electrodiagnostic evidence of mild  background slowing, indicating bihemispheric malfunction.  Levert Feinstein, M.D. Ph.D.  [facility] [address] [place], [place] [address] Phone: [phone] Fax:      [phone]

## 2023-07-10 ENCOUNTER — Encounter: Payer: Self-pay | Admitting: Cardiology

## 2023-07-10 ENCOUNTER — Ambulatory Visit: Payer: Medicare Other | Attending: Cardiology | Admitting: Cardiology

## 2023-07-10 VITALS — BP 110/60 | HR 55 | Resp 16 | Ht 59.0 in | Wt 109.0 lb

## 2023-07-10 DIAGNOSIS — I3139 Other pericardial effusion (noninflammatory): Secondary | ICD-10-CM | POA: Insufficient documentation

## 2023-07-10 DIAGNOSIS — Q211 Atrial septal defect, unspecified: Secondary | ICD-10-CM | POA: Diagnosis not present

## 2023-07-10 NOTE — Progress Notes (Signed)
Cardiology Office Note:  .   Date:  07/10/2023  ID:  ANJOLIE MCWHINNEY, DOB 03/18/1956, MRN [id number] PCP: Alfredo Martinez, MD  [facility] Providers Cardiologist:  Truett Mainland, MD PCP: Alfredo Martinez, MD  Chief Complaint  Patient presents with   Syncope and collapse   Follow-up      History of Present Illness: .    Mira SANDI MIELES is a 67 y.o. female with Down's syndrome, blindness, vit B12 deficiency, ASD, syncope, seizures  Patient is here today with her cousin [name].  In October 2024, patient had 1 episode of sudden generalized weakness, without loss of consciousness.  She did have loss of bowel control at that time.  I have reviewed recent follow-up with neurology.  Patient is diagnosed to have seizures, with abnormal findings on brain MRI.  She recently underwent EEG and has follow-up scheduled with Dr. Terrace Arabia [facility].  Patient has had history of syncope, but has not had any recent frank syncope episode.  Vitals:   07/10/23 0912  BP: 110/60  Pulse: (!) 55  Resp: 16  SpO2: 98%     ROS:  Review of Systems  Unable to perform ROS: Other  Patient has Down syndrome, review provided by her cousin [name] No leg edema, dyspnea  Studies Reviewed: .       Echocardiogram 03/13/2022:  1. Left ventricular ejection fraction, by estimation, is 55 to 60%. The  left ventricle has normal function. The left ventricle has no regional  wall motion abnormalities. There is mild left ventricular hypertrophy.  Left ventricular diastolic parameters  are indeterminate.   2. Right ventricular systolic function is normal. The right ventricular  size is moderately enlarged.   3. On image 79, concern for significant left to right shunt by color  doppler, concerning for ASD. Evidence of atrial level shunting detected by  color flow Doppler. There is a large secundum atrial septal defect with  predominantly left to right shunting  across  the atrial septum.   4. Right atrial size was moderately dilated.   5. A small pericardial effusion is present. The pericardial effusion is  circumferential. There is no evidence of cardiac tamponade.   6. The mitral valve is normal in structure. Trivial mitral valve  regurgitation. No evidence of mitral stenosis.   7. The aortic valve is tricuspid. Aortic valve regurgitation is not  visualized. No aortic stenosis is present.   8. The inferior vena cava is normal in size with greater than 50%  respiratory variability, suggesting right atrial pressure of 3 mmHg.    Physical Exam:   Physical Exam Vitals and nursing note reviewed.  Constitutional:      General: She is not in acute distress. Neck:     Vascular: No JVD.  Cardiovascular:     Rate and Rhythm: Normal rate and regular rhythm.     Heart sounds: Murmur heard.     High-pitched blowing holosystolic murmur is present with a grade of 3/6 at the lower right sternal border.  Pulmonary:     Effort: Pulmonary effort is normal.     Breath sounds: Normal breath sounds. No wheezing or rales.  Musculoskeletal:     Right lower leg: No edema.     Left lower leg: No edema.      VISIT DIAGNOSES:   ICD-10-CM   1. ASD (atrial septal defect)  Q21.10 ECHOCARDIOGRAM COMPLETE    2. Pericardial effusion  I31.39 ECHOCARDIOGRAM COMPLETE  ASSESSMENT AND PLAN: .    AMEILYA AMENDT is a 67 y.o. female with with Down's syndrome, blindness, vit B12 deficiency, ASD, syncope, seizures  ASD: Known ASD without any clinical signs/symptoms of right-sided overload. No immediate indication for ASD repair. Will obtain an echocardiogram as a follow-up from her original echocardiogram that showed ASD and small pericardial effusion in 02/2022.  Syncope:  Prior episodes of syncope most likely vasovagal in etiology. She has not had any recent syncope, but has had sudden weakness, with loss of bowel control.  With her h/o seizures, that is more  likely to be the etiology.  That said, she remains at risk for arrhythmias including AV block, given her Down syndrome and ASD. I discussed with patient's cousin, legal guardian, [name] whether or not they would like to continue further workup.  [name] concerned that patient may pick at loop recorder, which would be risk for accidental explantation, as well as infection.  They also remain unsure whether or not they would like to consider pacemaker or ICD in future, should loop recorder show any dangerous arrhythmia findings. [name] is going to discuss with patient's mother and keep [place] updated.   Time spent: 30 min   F/u in 6  months  Signed, Elder Negus, MD

## 2023-07-10 NOTE — Patient Instructions (Signed)
Medication Instructions:   Your physician recommends that you continue on your current medications as directed. Please refer to the Current Medication list given to you today.  *If you need a refill on your cardiac medications before your next appointment, please call your pharmacy*   Testing/Procedures:  Your physician has requested that you have an echocardiogram. Echocardiography is a painless test that uses sound waves to create images of your heart. It provides your doctor with information about the size and shape of your heart and how well your heart's chambers and valves are working. This procedure takes approximately one hour. There are no restrictions for this procedure. Please do NOT wear cologne, perfume, aftershave, or lotions (deodorant is allowed). Please arrive 15 minutes prior to your appointment time.  Please note: We ask at that you not bring children with you during ultrasound (echo/ vascular) testing. Due to room size and safety concerns, children are not allowed in the ultrasound rooms during exams. Our front office staff cannot provide observation of children in our lobby area while testing is being conducted. An adult accompanying a patient to their appointment will only be allowed in the ultrasound room at the discretion of the ultrasound technician under special circumstances. We apologize for any inconvenience.     Follow-Up: At [facility], you and your health needs are our priority.  As part of our continuing mission to provide you with exceptional heart care, we have created designated Provider Care Teams.  These Care Teams include your primary Cardiologist (physician) and Advanced Practice Providers (APPs -  Physician Assistants and Nurse Practitioners) who all work together to provide you with the care you need, when you need it.  We recommend signing up for the patient portal called "MyChart".  Sign up information is provided on this After Visit Summary.   MyChart is used to connect with patients for Virtual Visits (Telemedicine).  Patients are able to view lab/test results, encounter notes, upcoming appointments, etc.  Non-urgent messages can be sent to your provider as well.   To learn more about what you can do with MyChart, go to [email or web address].    Your next appointment:   6 month(s)  Provider:   Dr. Clare Gandy     Other Instructions  Implantable Loop Recorder Placement  An implantable loop recorder is a small electronic device that is placed under the skin of the chest. The device records the electrical activity of the heart over a long period of time. Your health care provider can download these recordings to monitor your heart. You may need an implantable loop recorder if you have periods of abnormal heart activity (arrhythmias) or unexplained fainting (syncope). The recorder can be left in place for as long as 3 years. Tell a health care provider about: Any allergies you have. All medicines you are taking, including vitamins, herbs, eye drops, creams, and over-the-counter medicines. Any problems you or family members have had with anesthesia. Any bleeding problems you have. Any surgeries you have had. Any medical conditions you have. Whether you are pregnant or may be pregnant. What are the risks? Your health care provider will talk with you about risks. These may include: Infection. Bleeding. Allergic reactions to anesthesia. Damage to nerves or blood vessels. Failure of the device to work. This could require another surgery to replace it. What happens before the procedure? You may have a physical exam, blood tests, and imaging tests, such as a chest X-ray. Follow instructions from your health care provider about what  you may eat and drink Ask your health care provider about: Changing or stopping your regular medicines. These include any diabetes medicines or blood thinners you take. Taking medicines such as  aspirin and ibuprofen. These medicines can thin your blood. Do not take them unless your health care provider tells you to. Taking over-the-counter medicines, vitamins, herbs, and supplements. Ask your health care provider: How your surgery site will be marked. What steps will be taken to help prevent infection. These steps may include: Removing hair at the surgery site. Washing skin with a soap that kills germs. Taking antibiotics. If you will be going home right after the procedure, plan to have a responsible adult: Take you home from the hospital or clinic. You will not be allowed to drive. Do not use any products that contain nicotine or tobacco before the procedure. These products include cigarettes, chewing tobacco, and vaping devices, such as e-cigarettes. If you need help quitting, ask your health care provider What happens during the procedure? An IV will be inserted into one of your veins. You may be given: A sedative. This helps you relax Anesthesia. This will: Numb certain areas of your body. Make you fall asleep for surgery. A small incision will be made on the left side of your upper chest. A pocket will be created under your skin. The device will be placed in the pocket. The incision will be closed with stitches (sutures) or adhesive strips. A bandage (dressing) will be placed over the incision. The procedure may vary among health care providers and hospitals. What happens after the procedure? Your blood pressure, heart rate, breathing rate, and blood oxygen level will be monitored until you leave the hospital or clinic. You may be able to go home on the day of your surgery. Before you go home: Your health care provider will program your recorder. You will learn how to trigger your device with a handheld activator. You will learn how to send recordings to your health care provider. You will get an ID card for your device, and you will be told when to use it. This  information is not intended to replace advice given to you by your health care provider. Make sure you discuss any questions you have with your health care provider. Document Revised: 01/09/2022 Document Reviewed: 01/09/2022 Elsevier Patient Education  2024 [employer].

## 2023-07-12 ENCOUNTER — Ambulatory Visit: Payer: Medicare Other | Admitting: Cardiology

## 2023-08-13 ENCOUNTER — Ambulatory Visit: Payer: Medicare Other | Admitting: Podiatry

## 2023-08-15 ENCOUNTER — Encounter: Payer: Self-pay | Admitting: Podiatry

## 2023-08-15 ENCOUNTER — Ambulatory Visit (INDEPENDENT_AMBULATORY_CARE_PROVIDER_SITE_OTHER): Payer: Medicare Other | Admitting: Podiatry

## 2023-08-15 DIAGNOSIS — B351 Tinea unguium: Secondary | ICD-10-CM

## 2023-08-15 DIAGNOSIS — M79674 Pain in right toe(s): Secondary | ICD-10-CM | POA: Diagnosis not present

## 2023-08-15 DIAGNOSIS — H548 Legal blindness, as defined in USA: Secondary | ICD-10-CM

## 2023-08-15 NOTE — Progress Notes (Signed)
Complaint:  Visit Type: Patient returns to my office for continued preventative foot care services. Complaint: Patient states" my nails have grown long and thick and become painful to walk and wear shoes"  The patient presents for preventative foot care services. No changes to ROS.  Patient presents to the office with her caregiver..  Podiatric Exam: Vascular: dorsalis pedis and posterior tibial pulses are palpable bilateral. Capillary return is immediate. Temperature gradient is WNL. Skin turgor WNL  Sensorium: Normal Semmes Weinstein monofilament test. Normal tactile sensation bilaterally. Nail Exam: Pt has thick disfigured discolored nails with subungual debris noted bilateral entire nail hallux through fifth toenails,  Especially right great toenail. Ulcer Exam: There is no evidence of ulcer or pre-ulcerative changes or infection. Orthopedic Exam: Muscle tone and strength are WNL. No limitations in general ROM. No crepitus or effusions noted. HAV  B/L.  Hammer toes  B/L. Skin: No Porokeratosis. No infection or ulcers  Diagnosis:  Onychomycosis, , Pain in right toe, pain in left toes  Treatment & Plan Procedures and Treatment: Consent by patient was obtained for treatment procedures.   Debridement of mycotic and hypertrophic toenails, 1 through 5 bilateral and clearing of subungual debris. No ulceration, no infection noted.  Return Visit-Office Procedure: Patient instructed to return to the office for a follow up visit  3   months for continued evaluation and treatment.    Gardiner Barefoot DPM

## 2023-08-17 ENCOUNTER — Ambulatory Visit (HOSPITAL_COMMUNITY): Payer: Medicare Other | Attending: Cardiology

## 2023-08-17 DIAGNOSIS — Q211 Atrial septal defect, unspecified: Secondary | ICD-10-CM | POA: Diagnosis not present

## 2023-08-17 DIAGNOSIS — I3139 Other pericardial effusion (noninflammatory): Secondary | ICD-10-CM | POA: Insufficient documentation

## 2023-08-17 LAB — ECHOCARDIOGRAM COMPLETE
Area-P 1/2: 2.59 cm2
S' Lateral: 1.9 cm

## 2023-08-17 NOTE — Progress Notes (Signed)
Normal LV systolic function, pericardial effusion is very small and stable from prior echocardiogram 03/13/2022.  No change in the ASD (hole in the upper chamber of the heart) from prior study as well.  Patient electively will follow-up with Dr. Rosemary Holms.  I have sent MyChart message as well.

## 2023-08-23 ENCOUNTER — Emergency Department (HOSPITAL_COMMUNITY)
Admission: EM | Admit: 2023-08-23 | Discharge: 2023-08-23 | Disposition: A | Discharge status: Home / Self Care | Payer: Medicaid Other | Attending: Emergency Medicine | Admitting: Emergency Medicine

## 2023-08-23 DIAGNOSIS — R4189 Other symptoms and signs involving cognitive functions and awareness: Secondary | ICD-10-CM | POA: Diagnosis present

## 2023-08-23 DIAGNOSIS — R419 Unspecified symptoms and signs involving cognitive functions and awareness: Secondary | ICD-10-CM

## 2023-08-23 NOTE — ED Provider Notes (Signed)
[facility] AT [facility] Provider Note  CSN: [id number] Arrival date & time: 08/23/23 0200  Chief Complaint(s) Special Needs; Found outside  HPI Yvonne Tyler is a 67 y.o. female brought in by EMS after [employer] was called out to residence where patient was banging on their door. She is a special needs patient. Since [employer] could not find her residence, she was brought in for safety reasons. Patient was able to provide her name and DOB. She has not complaints  HPI  Past Medical History No past medical history on file. There are no active problems to display for this patient.  Home Medication(s) Prior to Admission medications   Not on File                                                                                                                                    Allergies Patient has no allergy information on record.  Review of Systems Review of Systems As noted in HPI  Physical Exam Vital Signs  I have reviewed the triage vital signs BP (!) 97/69   Pulse (!) 52   Temp 98.1 F (36.7 C)   Resp (!) 16   Ht (!) 5' (1.524 m)   Wt (!) 63.5 kg   SpO2 98%   BMI 27.34 kg/m   Physical Exam Vitals reviewed.  Constitutional:      General: She is not in acute distress.    Appearance: She is well-developed. She is not diaphoretic.  HENT:     Head: Normocephalic and atraumatic.     Right Ear: External ear normal.     Left Ear: External ear normal.     Nose: Nose normal.  Eyes:     Comments: Legally blind  Neck:     Trachea: Phonation normal.  Cardiovascular:     Rate and Rhythm: Normal rate and regular rhythm.  Pulmonary:     Effort: Pulmonary effort is normal. No respiratory distress.     Breath sounds: No stridor.  Abdominal:     General: There is no distension.  Musculoskeletal:        General: Normal range of motion.     Cervical back: Normal range of motion.  Neurological:     Mental Status: She is alert.  Psychiatric:         Behavior: Behavior normal.     ED Results and Treatments Labs (all labs ordered are listed, but only abnormal results are displayed) Labs Reviewed - No data to display  EKG  EKG Interpretation Date/Time:    Ventricular Rate:    PR Interval:    QRS Duration:    QT Interval:    QTC Calculation:   R Axis:      Text Interpretation:         Radiology No results found.  Medications Ordered in ED Medications - No data to display Procedures Procedures  (including critical care time) Medical Decision Making / ED Course   Medical Decision Making   Called patient's primary caregiver, [name], who believed patient was sleeping in her room. She was able to confirm what the patient was wearing to accurately identify. She believes patient left w/o them knowing.  On her way to pick her up.      Final Clinical Impression(s) / ED Diagnoses Final diagnoses:  Cognitive safety issue   The patient appears reasonably screened and/or stabilized for discharge and I doubt any other medical condition or other [facility] requiring further screening, evaluation, or treatment in the ED at this time. I have discussed the findings, Dx and Tx plan with the patient/family who expressed understanding and agree(s) with the plan. Discharge instructions discussed at length. The patient/family was given strict return precautions who verbalized understanding of the instructions. No further questions at time of discharge.  Disposition: Discharge  Condition: Good  ED Discharge Orders     None        Follow Up: Primary care provider  Call  to schedule an appointment for close follow up    This chart was dictated using voice recognition software.  Despite best efforts to proofread,  errors can occur which can change the documentation meaning.    Nira Conn,  MD 08/23/23 [phone]

## 2023-08-23 NOTE — ED Triage Notes (Signed)
Pt BIB by [facility] after [facility] Department after pt was found beating on an apartment door. People in apartment called PD because they have never seen her before. EMS reports pt follows commands, vital signs stable.  [employer] & EMS unable to understand pt to know name.  Pt well dressed, well groomed.  Pt answers "no" verbally when asked if in any pain, if she knows her phone number, home address.

## 2023-10-12 ENCOUNTER — Encounter: Payer: Self-pay | Admitting: Student

## 2023-10-12 ENCOUNTER — Ambulatory Visit (INDEPENDENT_AMBULATORY_CARE_PROVIDER_SITE_OTHER): Payer: Medicare Other | Admitting: Student

## 2023-10-12 VITALS — BP 110/80 | HR 76 | Ht 60.0 in | Wt 105.5 lb

## 2023-10-12 DIAGNOSIS — H548 Legal blindness, as defined in USA: Secondary | ICD-10-CM | POA: Diagnosis present

## 2023-10-12 DIAGNOSIS — Q909 Down syndrome, unspecified: Secondary | ICD-10-CM | POA: Diagnosis not present

## 2023-10-12 NOTE — Assessment & Plan Note (Signed)
68 year old on Depakote and Melatonin. VBCI referral made for Meals on Wheels and verified primary phone number.

## 2023-10-12 NOTE — Progress Notes (Signed)
    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, requiring full time assistance and supervision   - Needs cleaning, bathing, cooking assistance  - Needs help Preparing food  - Otherwise, doing well  - Asking about meals on wheels   PERTINENT  PMH / PSH: Reviewed   OBJECTIVE:   BP 110/80   Pulse 76   Ht 5' (1.524 m)   Wt 105 lb 8 oz (47.9 kg)   LMP  (LMP Unknown) Comment: Mentaly challenged  SpO2 100%   BMI 20.60 kg/m   General: Alert in no apparent distress Heart: Regular rate and rhythm with no murmurs appreciated Lungs: CTA bilaterally, no wheezing Abdomen: Bowel sounds present, no abdominal pain Skin: Warm and dry   ASSESSMENT/PLAN:   Assessment & Plan BLINDNESS, LEGAL, [place] DEFINITION Filled out PCS form to best of my ability and will have assistance from nursing to fill out the rest. Discussed with family to call in 2 weeks if they have not yet heard about this  Down syndrome 68 year old on Depakote and Melatonin. VBCI referral made for Meals on Wheels and verified primary phone number.      Alfredo Martinez, MD [facility]

## 2023-10-12 NOTE — Assessment & Plan Note (Signed)
Filled out PCS form to best of my ability and will have assistance from nursing to fill out the rest. Discussed with family to call in 2 weeks if they have not yet heard about this

## 2023-10-12 NOTE — Patient Instructions (Addendum)
It was great to see you today! Thank you for choosing [facility] for your primary care.  Today we addressed: We will send off the paperwork for personal care services  I have placed a referral to the social work team who will contact about meals on wheels, please wait for that call   If you haven't already, sign up for My Chart to have easy access to your labs results, and communication with your primary care physician.   Please arrive 15 minutes before your appointment to ensure smooth check in process.  We appreciate your efforts in making this happen.  Thank you for allowing me to participate in your care, Alfredo Martinez, MD 10/12/2023, 2:43 PM PGY-3, [facility]

## 2023-10-16 ENCOUNTER — Telehealth: Payer: Self-pay | Admitting: *Deleted

## 2023-10-16 ENCOUNTER — Other Ambulatory Visit: Payer: Self-pay | Admitting: Student

## 2023-10-16 DIAGNOSIS — Z1231 Encounter for screening mammogram for malignant neoplasm of breast: Secondary | ICD-10-CM

## 2023-10-16 NOTE — Progress Notes (Unsigned)
Complex Care Management Note Care Guide Note  10/16/2023 Name: Yvonne Tyler MRN: [id number] DOB: 1956/02/09   Complex Care Management Outreach Attempts: {OUTREACH QMVHQION:[id number]}  Follow Up Plan:  Additional outreach attempts will be made to offer the patient complex care management information and services.   Encounter Outcome:  No Answer  Gwenevere Ghazi  [facility]  [facility], [facility] Guide  Direct Dial: [phone]  Fax [phone]

## 2023-10-17 NOTE — Progress Notes (Unsigned)
Complex Care Management Note Care Guide Note  10/17/2023 Name: Yvonne Tyler MRN: [id number] DOB: December 08, 1955   Complex Care Management Outreach Attempts: A second unsuccessful outreach was attempted today to offer the patient with information about available complex care management services.  Follow Up Plan:  Additional outreach attempts will be made to offer the patient complex care management information and services.   Encounter Outcome:  No Answer  Gwenevere Ghazi  [facility]  [facility], [facility] Guide  Direct Dial: [phone]  Fax [phone]

## 2023-10-18 NOTE — Progress Notes (Signed)
Complex Care Management Note  Care Guide Note 10/18/2023 Name: JASMARIE COPPOCK MRN: [id number] DOB: 08/25/1956  Mekaela KUMIKO FISHMAN is a 68 y.o. year old female who sees Alfredo Martinez, MD for primary care. I reached out to [name] by phone today to offer complex care management services.  Ms. Chaudhary was given information about Complex Care Management services today including:   The Complex Care Management services include support from the care team which includes your Nurse Care Manager, Clinical Social Worker, or Pharmacist.  The Complex Care Management team is here to help remove barriers to the health concerns and goals most important to you. Complex Care Management services are voluntary, and the patient may decline or stop services at any time by request to their care team member.   Complex Care Management Consent Status: Patient agreed to services and verbal consent obtained.   Follow up plan:  Telephone appointment with complex care management team member scheduled for:  2/25 with LCSW and 2/28 with RNCM   Encounter Outcome:  Patient Scheduled  Gwenevere Ghazi  [facility]  [facility], [facility] Guide  Direct Dial: [phone]  Fax [phone]

## 2023-10-22 ENCOUNTER — Telehealth: Payer: Self-pay

## 2023-10-22 NOTE — Telephone Encounter (Signed)
 [facility] 3051 form found in RN box.   Demographics completed and copy made and placed in batch scanning.   Placed in to be faxed pile.   Veronda Prude, RN

## 2023-10-23 ENCOUNTER — Ambulatory Visit: Payer: Self-pay | Admitting: Licensed Clinical Social Worker

## 2023-10-23 NOTE — Patient Outreach (Signed)
 Care Coordination   Initial Visit Note   10/23/2023 Name: DAWSYN ZURN MRN: [id number] DOB: [birth year]  Paula KEERA ALTIDOR is a 68 y.o. year old female who sees Alfredo Martinez, MD for primary care. I spoke with  [name] guardian [name] by phone today.  What matters to the patients health and wellness today?  Ms. Whitsett requested assistance with meal on wheels referral     Goals Addressed   None     SDOH assessments and interventions completed:  Yes  SDOH Interventions Today    Flowsheet Row Most Recent Value  SDOH Interventions   Food Insecurity Interventions Intervention Not Indicated  Housing Interventions Intervention Not Indicated  Transportation Interventions Intervention Not Indicated  Utilities Interventions Intervention Not Indicated        Care Coordination Interventions:  Yes, provided  Interventions Today    Flowsheet Row Most Recent Value  General Interventions   General Interventions Discussed/Reviewed Community Resources, Level of Care  [referral completed for meals on wheels. PCA process was completed by pt pcp]  Level of Care Personal Care Services       Follow up plan: No further intervention required.   Encounter Outcome:  Patient Visit Completed   Gwyndolyn Saxon MSW, LCSW Licensed Clinical Social Worker  [facility], Population Health Direct Dial: [phone]  Fax: [phone]

## 2023-10-23 NOTE — Patient Instructions (Signed)
 Visit Information  Thank you for taking time to visit with me today. Please don't hesitate to contact me if I can be of assistance to you.   Following are the goals we discussed today:   Goals Addressed   None     Please call the care guide team at [phone] if you need to cancel or reschedule your appointment.   If you are experiencing a Mental Health or Behavioral Health Crisis or need someone to talk to, please call 911   Patient verbalizes understanding of instructions and care plan provided today and agrees to view in MyChart. Active MyChart status and patient understanding of how to access instructions and care plan via MyChart confirmed with patient.     The patient has been provided with contact information for the care management team and has been advised to call with any health related questions or concerns.   Gwyndolyn Saxon MSW, LCSW Licensed Clinical Social Worker  [facility], Population Health Direct Dial: [phone]  Fax: [phone]

## 2023-10-26 ENCOUNTER — Other Ambulatory Visit: Payer: Self-pay | Admitting: *Deleted

## 2023-10-26 ENCOUNTER — Encounter: Payer: Self-pay | Admitting: *Deleted

## 2023-10-26 NOTE — Patient Instructions (Signed)
  Care Management   Initial Visit Note  10/26/2023 Name: Yvonne Tyler MRN: [id number] DOB: 1956-05-26  Yvonne Tyler is enrolled in a Managed Medicaid plan: No. Outreach attempt today was successful.   Subjective:   Objective:  Assessment: Yvonne Tyler is a 68 y.o. year old female who sees Yvonne Martinez, MD for primary care. The care management team was consulted for assistance with care management and care coordination needs related to Care Coordination.   Review of patient status, including review of consultants reports, relevant laboratory and other test results, and collaboration with appropriate care team members and the patient's provider was performed as part of comprehensive patient evaluation and provision of care management services.    SDOH (Social Determinants of Health) screening performed today. See Care Plan Entry related to challenges with: Social Connections   Goals Addressed   None      Follow up plan:  No further follow up required:    Yvonne Tyler was given information about Care Management services today including:  Care Management services include personalized support from designated clinical staff supervised by a physician, including individualized plan of care and coordination with other care providers 24/7 contact phone numbers for assistance for urgent and routine care needs. The patient may stop CCM services at any time (effective at the end of the month) by phone call to the office staff.  Patient agreed to services and verbal consent obtained.  Larey Brick, BSN RN [facility], [facility] RN Care Manager Direct Dial: [phone]  Fax: [phone]

## 2023-10-26 NOTE — Patient Outreach (Signed)
  Care Management   Visit Note  10/26/2023 Name: Yvonne Tyler MRN: [id number] DOB: 06-25-1956  Subjective: Yvonne Tyler is a 68 y.o. year old female who is a primary care patient of Alfredo Martinez, MD. The Care Management team was consulted for assistance.      Engaged with patient spoke with the family member (POA, [place], [place]).   Assessment: SDOH screens completed. Does not meet criteria for [facility] complex care management.   Interventions:  Per [name], they are seeking assistance with arranging for an adult daycare program and this process is underway  Recommendations: If new needs or concerns arise, please notify PCP for new referral to be placed.  Consent to Services:  Patient was given information about care management services, agreed to services, and gave verbal consent to participate.   Plan: No further follow up required:    Larey Brick, BSN RN [facility], [facility] RN Care Manager Direct Dial: [phone]  Fax: [phone]

## 2023-11-19 ENCOUNTER — Ambulatory Visit
Admission: RE | Admit: 2023-11-19 | Discharge: 2023-11-19 | Disposition: A | Discharge status: Home / Self Care | Payer: Medicare Other | Source: Ambulatory Visit | Attending: Physical Medicine & Rehabilitation | Admitting: Physical Medicine & Rehabilitation

## 2023-11-19 DIAGNOSIS — Z1231 Encounter for screening mammogram for malignant neoplasm of breast: Secondary | ICD-10-CM

## 2023-11-21 ENCOUNTER — Ambulatory Visit: Payer: Medicare Other | Admitting: Podiatry

## 2023-11-25 ENCOUNTER — Emergency Department (HOSPITAL_COMMUNITY)

## 2023-11-25 ENCOUNTER — Other Ambulatory Visit: Payer: Self-pay

## 2023-11-25 ENCOUNTER — Emergency Department (HOSPITAL_COMMUNITY): Admission: EM | Admit: 2023-11-25 | Discharge: 2023-11-25 | Disposition: A | Discharge status: Home / Self Care

## 2023-11-25 DIAGNOSIS — Q909 Down syndrome, unspecified: Secondary | ICD-10-CM | POA: Diagnosis not present

## 2023-11-25 DIAGNOSIS — R55 Syncope and collapse: Secondary | ICD-10-CM | POA: Diagnosis present

## 2023-11-25 DIAGNOSIS — R197 Diarrhea, unspecified: Secondary | ICD-10-CM | POA: Diagnosis not present

## 2023-11-25 DIAGNOSIS — I959 Hypotension, unspecified: Secondary | ICD-10-CM | POA: Insufficient documentation

## 2023-11-25 DIAGNOSIS — R0989 Other specified symptoms and signs involving the circulatory and respiratory systems: Secondary | ICD-10-CM | POA: Diagnosis not present

## 2023-11-25 DIAGNOSIS — R404 Transient alteration of awareness: Secondary | ICD-10-CM

## 2023-11-25 DIAGNOSIS — R001 Bradycardia, unspecified: Secondary | ICD-10-CM | POA: Insufficient documentation

## 2023-11-25 DIAGNOSIS — R112 Nausea with vomiting, unspecified: Secondary | ICD-10-CM | POA: Insufficient documentation

## 2023-11-25 DIAGNOSIS — I517 Cardiomegaly: Secondary | ICD-10-CM | POA: Diagnosis not present

## 2023-11-25 DIAGNOSIS — R202 Paresthesia of skin: Secondary | ICD-10-CM | POA: Diagnosis not present

## 2023-11-25 LAB — COMPREHENSIVE METABOLIC PANEL WITH GFR
ALT: 15 U/L (ref 0–44)
AST: 26 U/L (ref 15–41)
Albumin: 3.2 g/dL — ABNORMAL LOW (ref 3.5–5.0)
Alkaline Phosphatase: 45 U/L (ref 38–126)
Anion gap: 12 (ref 5–15)
BUN: 15 mg/dL (ref 8–23)
CO2: 23 mmol/L (ref 22–32)
Calcium: 8.5 mg/dL — ABNORMAL LOW (ref 8.9–10.3)
Chloride: 104 mmol/L (ref 98–111)
Creatinine, Ser: 1.06 mg/dL — ABNORMAL HIGH (ref 0.44–1.00)
GFR, Estimated: 58 mL/min — ABNORMAL LOW
Glucose, Bld: 97 mg/dL (ref 70–99)
Potassium: 4.5 mmol/L (ref 3.5–5.1)
Sodium: 139 mmol/L (ref 135–145)
Total Bilirubin: 1.1 mg/dL (ref 0.0–1.2)
Total Protein: 6.6 g/dL (ref 6.5–8.1)

## 2023-11-25 LAB — URINALYSIS, ROUTINE W REFLEX MICROSCOPIC
Bilirubin Urine: NEGATIVE
Glucose, UA: NEGATIVE mg/dL
Ketones, ur: NEGATIVE mg/dL
Nitrite: NEGATIVE
Protein, ur: 30 mg/dL — AB
Specific Gravity, Urine: 1.024 (ref 1.005–1.030)
WBC, UA: >50 WBC/hpf (ref 0–5)
pH: 7 (ref 5.0–8.0)

## 2023-11-25 LAB — CBC
HCT: 40.1 % (ref 36.0–46.0)
Hemoglobin: 13.2 g/dL (ref 12.0–15.0)
MCH: 36.3 pg — ABNORMAL HIGH (ref 26.0–34.0)
MCHC: 32.9 g/dL (ref 30.0–36.0)
MCV: 110.2 fL — ABNORMAL HIGH (ref 80.0–100.0)
Platelets: 154 10*3/uL (ref 150–400)
RBC: 3.64 MIL/uL — ABNORMAL LOW (ref 3.87–5.11)
RDW: 12.7 % (ref 11.5–15.5)
WBC: 6.6 10*3/uL (ref 4.0–10.5)
nRBC: 0 % (ref 0.0–0.2)

## 2023-11-25 LAB — TROPONIN I (HIGH SENSITIVITY)
Troponin I (High Sensitivity): 3 ng/L (ref <18)
Troponin I (High Sensitivity): 4 ng/L (ref <18)

## 2023-11-25 LAB — RESP PANEL BY RT-PCR (RSV, FLU A&B, COVID)  RVPGX2
Influenza A by PCR: NEGATIVE
Influenza B by PCR: NEGATIVE
Resp Syncytial Virus by PCR: NEGATIVE
SARS Coronavirus 2 by RT PCR: NEGATIVE

## 2023-11-25 LAB — LIPASE, BLOOD: Lipase: 23 U/L (ref 11–51)

## 2023-11-25 LAB — TSH: TSH: 17.898 u[IU]/mL — ABNORMAL HIGH (ref 0.350–4.500)

## 2023-11-25 LAB — CBG MONITORING, ED: Glucose-Capillary: 110 mg/dL — ABNORMAL HIGH (ref 70–99)

## 2023-11-25 LAB — I-STAT CG4 LACTIC ACID, ED: Lactic Acid, Venous: 1.6 mmol/L (ref 0.5–1.9)

## 2023-11-25 MED ORDER — CEPHALEXIN 250 MG PO CAPS
1000.0000 mg | ORAL_CAPSULE | Freq: Once | ORAL | Status: AC
  Administered 2023-11-25: 1000 mg via ORAL
  Filled 2023-11-25: qty 4

## 2023-11-25 MED ORDER — CEPHALEXIN 500 MG PO CAPS: 500.0000 mg | ORAL_CAPSULE | Freq: Four times a day (QID) | ORAL | 0 refills | Status: DC

## 2023-11-25 MED ORDER — IOHEXOL 350 MG/ML SOLN
75.0000 mL | Freq: Once | INTRAVENOUS | Status: AC | PRN
  Administered 2023-11-25: 75 mL via INTRAVENOUS

## 2023-11-25 MED ORDER — SODIUM CHLORIDE 0.9 % IV BOLUS
1000.0000 mL | Freq: Once | INTRAVENOUS | Status: AC
  Administered 2023-11-25: 1000 mL via INTRAVENOUS

## 2023-11-25 NOTE — ED Provider Notes (Signed)
 Received patient in turnover from Dr. Maple Hudson.  Please see their note for further details of Hx, PE.  Briefly patient is a 68 y.o. female with a Emesis and Hypotension .  Patient had an episode where she had lost consciousness.  Back to baseline now.  Plan for CT abdomen pelvis and reassess.  CT of the abdomen pelvis without obvious acute pathology.  Awaiting UA.  UA has resulted and shows too numerous to count bacteria.  Will start on oral antibiotics.  PCP follow-up.    Melene Plan, DO 11/25/23 2325

## 2023-11-25 NOTE — ED Provider Notes (Addendum)
 [facility] EMERGENCY DEPARTMENT AT [facility] Provider Note   CSN: [id number] Arrival date & time: 11/25/23  1218     History  Chief Complaint  Patient presents with   Emesis   Hypotension    Yvonne Tyler is a 68 y.o. female.  68 year old with Down syndrome presenting emergency department by EMS.  EMS called out for nausea vomiting diarrhea.  Symptoms tingling starting today.  Noted patient hypotensive, gave fluids with improvement.  Patient stating that she is cold currently.  Denying lightheadedness, chest pain, shortness of breath, abdominal pain.   Emesis      Home Medications Prior to Admission medications   Medication Sig Start Date End Date Taking? Authorizing Provider  divalproex  (DEPAKOTE  ER) 500 MG 24 hr tablet Take 1 tablet (500 mg total) by mouth at bedtime. 05/09/23   Phebe Brasil, MD  melatonin 1 MG TABS tablet Take 1 tablet (1 mg total) by mouth at bedtime. Patient taking differently: Take 1 mg by mouth as needed. 07/30/22   Ernestina Headland, MD      Allergies    Patient has no allergy information on record.    Review of Systems   Review of Systems  Gastrointestinal:  Positive for vomiting.    Physical Exam Updated Vital Signs BP 103/80   Pulse 63   Temp (!) 97.4 F (36.3 C) (Oral)   Resp 17   Ht 5' (1.524 m)   Wt 49.9 kg   LMP  (LMP Unknown) Comment: Mentaly challenged  SpO2 98%   BMI 21.48 kg/m  Physical Exam Vitals and nursing note reviewed.  Constitutional:      General: She is not in acute distress.    Appearance: She is not toxic-appearing.  HENT:     Head: Normocephalic and atraumatic.     Nose: Nose normal.  Eyes:     Pupils: Pupils are equal, round, and reactive to light.  Cardiovascular:     Rate and Rhythm: Regular rhythm. Bradycardia present.  Pulmonary:     Effort: Pulmonary effort is normal.     Breath sounds: Normal breath sounds.  Abdominal:     General: Abdomen is flat. There is no distension.      Palpations: Abdomen is soft.     Tenderness: There is no abdominal tenderness. There is no guarding or rebound.  Musculoskeletal:        General: Normal range of motion.  Skin:    General: Skin is warm and dry.     Capillary Refill: Capillary refill takes less than 2 seconds.  Neurological:     Mental Status: She is alert. Mental status is at baseline.  Psychiatric:        Mood and Affect: Mood normal.        Behavior: Behavior normal.     ED Results / Procedures / Treatments   Labs (all labs ordered are listed, but only abnormal results are displayed) Labs Reviewed  CBC - Abnormal; Notable for the following components:      Result Value   RBC 3.64 (*)    MCV 110.2 (*)    MCH 36.3 (*)    All other components within normal limits  TSH - Abnormal; Notable for the following components:   TSH 17.898 (*)    All other components within normal limits  COMPREHENSIVE METABOLIC PANEL WITH GFR - Abnormal; Notable for the following components:   Creatinine, Ser 1.06 (*)    Calcium 8.5 (*)  Albumin 3.2 (*)    GFR, Estimated 58 (*)    All other components within normal limits  CBG MONITORING, ED - Abnormal; Notable for the following components:   Glucose-Capillary 110 (*)    All other components within normal limits  RESP PANEL BY RT-PCR (RSV, FLU A&B, COVID)  RVPGX2  CULTURE, BLOOD (ROUTINE X 2)  CULTURE, BLOOD (ROUTINE X 2)  LIPASE, BLOOD  URINALYSIS, ROUTINE W REFLEX MICROSCOPIC  I-STAT CG4 LACTIC ACID, ED  TROPONIN I (HIGH SENSITIVITY)  TROPONIN I (HIGH SENSITIVITY)    EKG None  Radiology DG Chest Portable 1 View Result Date: 11/25/2023 CLINICAL DATA:  Hypotensive in bradycardiac. EXAM: PORTABLE CHEST 1 VIEW COMPARISON:  03/12/2022 FINDINGS: There is mild cardiac enlargement. No signs of pleural effusion. Signs of pulmonary vascular congestion noted. No airspace consolidation. No signs of pneumothorax. The visualized osseous structures appear unremarkable. IMPRESSION: Mild  cardiac enlargement and pulmonary vascular congestion. Electronically Signed   By: Kimberley Penman M.D.   On: 11/25/2023 13:05    Procedures Procedures    Medications Ordered in ED Medications  sodium chloride  0.9 % bolus 1,000 mL (0 mLs Intravenous Stopped 11/25/23 1412)    ED Course/ Medical Decision Making/ A&P Clinical Course as of 11/25/23 1546  Sun Nov 25, 2023  1350 Family member presented to bedside.  Stated that patient in normal state of health until she had abrupt onset of diarrhea, vomiting and then seemingly had a near syncopal type episode. [TY]    Clinical Course User Index [TY] Rolinda Climes, DO                                 Medical Decision Making Is a 68 year old female with history of Down syndrome, hyperlipidemia, blindness, syncope presenting emergency department after a near syncopal type episode with nausea vomiting diarrhea.  EMS reported she patient with hypertension reportedly in the 40s to 50s systolic.  Improved with IV fluids.  They noted bradycardia.  Per chart review patient does have a history of bradycardia.  Family members present at bedside for further history; they note that patient currently is under baseline.  Patient has no specific complaints currently.  She was borderline hypothermic, given the low blood pressure concern for possible infectious etiology, however no elevated lactate, no leukocytosis.  Concern for possible viral etiology given complaint of nausea vomiting diarrhea that started today.  However flu/COVID/RSV are negative.  Largely benign abdominal exam.  No transaminitis to suggest hepatobiliary disease.  Lipase normal.  Given patient with mental disability and poor historian CT scan ordered and is pending.  She received further IV fluids here.  EKG with bradycardia, troponin negative.  ACS unlikely.  Low suspicion for PE.  Care signed out to afternoon team disposition pending CT scan.  Chest x-ray without pneumonia pneumothorax, dense of  some vascular congestion.  Possible discharge home with patient blood pressure improved, tolerating p.o. and CT scan negative as she is seemingly has had near syncopal type episodes like this in the past per family members.  Amount and/or Complexity of Data Reviewed External Data Reviewed:     Details: Last echo with trace effusion, but normal EF Labs: ordered. Decision-making details documented in ED Course. Radiology: ordered. Decision-making details documented in ED Course. ECG/medicine tests: ordered.        Final Clinical Impression(s) / ED Diagnoses Final diagnoses:  None    Rx / DC Orders ED  Discharge Orders     None         Rolinda Climes, DO 11/25/23 1546    Rolinda Climes, DO 12/22/23 2315

## 2023-11-25 NOTE — Discharge Instructions (Signed)
 Please help with your family doctor in the office.  Please return for repeat events or if she has persistent symptoms.  The urine could have been infected based on the dipstick and microscopic exam.  Of starting her on some antibiotics.

## 2023-11-25 NOTE — ED Triage Notes (Signed)
 Patient from home-called EMS for n/v. On EMS arrival patient hypotensive (40s/30s) and bradycardic. Given 1L LR by EMS with improvement to 107/48 on arrival to ED.

## 2023-11-29 ENCOUNTER — Encounter: Payer: Self-pay | Admitting: Family Medicine

## 2023-11-29 ENCOUNTER — Ambulatory Visit (INDEPENDENT_AMBULATORY_CARE_PROVIDER_SITE_OTHER): Admitting: Family Medicine

## 2023-11-29 VITALS — BP 105/69 | HR 57 | Ht 60.0 in | Wt 104.4 lb

## 2023-11-29 DIAGNOSIS — R001 Bradycardia, unspecified: Secondary | ICD-10-CM

## 2023-11-29 DIAGNOSIS — N3090 Cystitis, unspecified without hematuria: Secondary | ICD-10-CM

## 2023-11-29 NOTE — Progress Notes (Signed)
    SUBJECTIVE:   CHIEF COMPLAINT / HPI:   ED f/u Visited ED 3/30 due to nausea/vomiting/diarrhea and near syncopal episode. Was hypotensive, given fluids. Found to have UTI, treated w/ keflex   Improving since then Eating/drinking improving - basically normal now  No additional concerns  Cardiology follow up scheduled in May for slight bradycardia    PERTINENT  PMH / PSH: Down syndrome  OBJECTIVE:   BP 105/69   Pulse (!) 57   Ht 5' (1.524 m)   Wt 104 lb 6.4 oz (47.4 kg)   LMP  (LMP Unknown) Comment: Mentaly challenged  SpO2 99%   BMI 20.39 kg/m   General: NAD, pleasant, able to participate in exam Cardiac: RRR, no murmurs auscultated Respiratory: CTAB, normal WOB Abdomen: soft, non-tender, non-distended, normoactive bowel sounds Extremities: warm and well perfused, no edema or cyanosis Skin: warm and dry, no rashes noted Neuro: alert, no obvious focal deficits, speech normal Psych: Normal affect and mood  ASSESSMENT/PLAN:   Assessment & Plan Cystitis Treated with Keflex, patient has about 3 days remaining of his course.  Instructed to continue this course and follow-up as needed for any worsening symptoms.  She was also noted to have a slight bump in her creatinine in the ED to 1.06; however, patient has since had improved oral intake with less vomiting/diarrhea so do not feel that we need to recheck this today.  Discussed return precautions and supportive care, follow-up as needed. Bradycardia Sinus bradycardia noted in the ED, HR 57 today with regular rhythm.  Asymptomatic currently.  Follow-up scheduled with cardiology 5/13.   Vonna Drafts, MD [facility]

## 2023-11-29 NOTE — Patient Instructions (Signed)
 Please continue taking your antibiotic as prescribed  Please let us know if, after completing the antibiotic, there are any worsening or continued symptoms  Follow-up with cardiology as scheduled

## 2023-11-30 LAB — CULTURE, BLOOD (ROUTINE X 2)
Culture: NO GROWTH
Culture: NO GROWTH

## 2023-12-04 ENCOUNTER — Ambulatory Visit: Admitting: Podiatry

## 2023-12-05 ENCOUNTER — Ambulatory Visit (INDEPENDENT_AMBULATORY_CARE_PROVIDER_SITE_OTHER): Admitting: Podiatry

## 2023-12-05 ENCOUNTER — Encounter: Payer: Self-pay | Admitting: Podiatry

## 2023-12-05 DIAGNOSIS — M79674 Pain in right toe(s): Secondary | ICD-10-CM | POA: Diagnosis not present

## 2023-12-05 DIAGNOSIS — H548 Legal blindness, as defined in USA: Secondary | ICD-10-CM

## 2023-12-05 DIAGNOSIS — B351 Tinea unguium: Secondary | ICD-10-CM | POA: Diagnosis not present

## 2023-12-05 NOTE — Progress Notes (Signed)
Complaint:  Visit Type: Patient returns to my office for continued preventative foot care services. Complaint: Patient states" my nails have grown long and thick and become painful to walk and wear shoes"  The patient presents for preventative foot care services. No changes to ROS.  Patient presents to the office with her caregiver..  Podiatric Exam: Vascular: dorsalis pedis and posterior tibial pulses are palpable bilateral. Capillary return is immediate. Temperature gradient is WNL. Skin turgor WNL  Sensorium: Normal Semmes Weinstein monofilament test. Normal tactile sensation bilaterally. Nail Exam: Pt has thick disfigured discolored nails with subungual debris noted bilateral entire nail hallux through fifth toenails,  Especially right great toenail. Ulcer Exam: There is no evidence of ulcer or pre-ulcerative changes or infection. Orthopedic Exam: Muscle tone and strength are WNL. No limitations in general ROM. No crepitus or effusions noted. HAV  B/L.  Hammer toes  B/L. Skin: No Porokeratosis. No infection or ulcers  Diagnosis:  Onychomycosis, , Pain in right toe, pain in left toes  Treatment & Plan Procedures and Treatment: Consent by patient was obtained for treatment procedures.   Debridement of mycotic and hypertrophic toenails, 1 through 5 bilateral and clearing of subungual debris. No ulceration, no infection noted.  Return Visit-Office Procedure: Patient instructed to return to the office for a follow up visit  3   months for continued evaluation and treatment.    Gardiner Barefoot DPM

## 2024-01-08 ENCOUNTER — Encounter: Payer: Self-pay | Admitting: Cardiology

## 2024-01-08 ENCOUNTER — Ambulatory Visit: Attending: Cardiology | Admitting: Cardiology

## 2024-01-08 VITALS — BP 110/64 | HR 56 | Resp 16 | Ht 60.0 in | Wt 102.0 lb

## 2024-01-08 DIAGNOSIS — Q211 Atrial septal defect, unspecified: Secondary | ICD-10-CM

## 2024-01-08 NOTE — Patient Instructions (Signed)
 Follow-Up: At [facility], you and your health needs are our priority.  As part of our continuing mission to provide you with exceptional heart care, our providers are all part of one team.  This team includes your primary Cardiologist (physician) and Advanced Practice Providers or APPs (Physician Assistants and Nurse Practitioners) who all work together to provide you with the care you need, when you need it.  Your next appointment:   AS NEEDED   Provider:   Cody Das, MD    We recommend signing up for the patient portal called "MyChart".  Sign up information is provided on this After Visit Summary.  MyChart is used to connect with patients for Virtual Visits (Telemedicine).  Patients are able to view lab/test results, encounter notes, upcoming appointments, etc.  Non-urgent messages can be sent to your provider as well.   To learn more about what you can do with MyChart, go to [email or web address].

## 2024-01-08 NOTE — Progress Notes (Addendum)
 Cardiology Office Note:  .   Date:  01/08/2024  ID:  Yvonne Tyler, DOB [birth year], MRN [id number] PCP: Yvonne Headland, MD  [facility] Providers Cardiologist:  Yvonne Ivy, MD PCP: Yvonne Headland, MD  Chief Complaint  Patient presents with   Atrial Septal Defect   Follow-up      History of Present Illness: .    Yvonne Tyler is a 68 y.o. female with Down's syndrome, blindness, vit B12 deficiency, ASD, syncope, seizures  Patient is here today with [name].  Since her last visit with me, patient has had 1 visit to ER with nausea, vomiting, hypertension, that has since resolved.  She is able to get around at her house without any complaints of chest pain, shortness of breath, leg edema.  She has not any more syncopal episodes.  e episode.  Vitals:   01/08/24 1538  BP: 110/64  Pulse: (!) 56  Resp: 16  SpO2: 96%      ROS:  Review of Systems  Unable to perform ROS: Other  Cardiovascular:  Negative for chest pain, dyspnea on exertion, leg swelling, palpitations and syncope.  Patient has Down syndrome, review provided by [name] No leg edema, dyspnea  Studies Reviewed: .       Echocardiogram 07/2023:  1. 1. Left ventricular ejection fraction, by estimation, is 60 to 65%. Left  ventricular ejection fraction by PLAX is 62 %. The left ventricle has  normal function. The left ventricle has no regional wall motion  abnormalities. Left ventricular diastolic  parameters are consistent with Grade I diastolic dysfunction (impaired  relaxation).   2. Right ventricular systolic function is normal. The right ventricular  size is moderately enlarged. There is normal pulmonary artery systolic  pressure. The estimated right ventricular systolic pressure is 33.9 mmHg.   3. Left atrial size was mildly dilated.   4. Right atrial size was mildly dilated.   5. A small pericardial effusion is present. The pericardial effusion is   circumferential. There is no evidence of cardiac tamponade.   6. The mitral valve is grossly normal. Trivial mitral valve  regurgitation.   7. The aortic valve is tricuspid. Aortic valve regurgitation is not  visualized.   8. The inferior vena cava is normal in size with greater than 50%  respiratory variability, suggesting right atrial pressure of 3 mmHg.   9. Evidence of atrial level shunting detected by color flow Doppler.  There is a large secundum atrial septal defect with predominantly left to  right shunting across the atrial septum.   Comparison(s): No significant change from prior study. 03/13/2022: LVEF  55-60%, large secundum ASD with left to right flow, moderate RVE, small  pericardial effusion.    Independently interpreted 10/2023: Hb 13.2 Cr 1.06 Trop HS 4   Physical Exam:   Physical Exam Vitals and nursing note reviewed.  Constitutional:      General: She is not in acute distress. Neck:     Vascular: No JVD.  Cardiovascular:     Rate and Rhythm: Normal rate and regular rhythm.     Heart sounds: Murmur heard.     High-pitched blowing holosystolic murmur is present with a grade of 3/6 at the lower right sternal border.  Pulmonary:     Effort: Pulmonary effort is normal.     Breath sounds: Normal breath sounds. No wheezing or rales.  Musculoskeletal:     Right lower leg: No edema.     Left  lower leg: No edema.      VISIT DIAGNOSES:   ICD-10-CM   1. ASD (atrial septal defect)  Q21.10         ASSESSMENT AND PLAN: .    Yvonne Tyler is a 68 y.o. female with with Down's syndrome, blindness, vit B12 deficiency, ASD, syncope, seizures  ASD: Known large second limb ASD without any clinical signs/symptoms of right-sided overload. No immediate indication for ASD repair. In absence of clinical heart failure, and concern regarding recovery, patient's legal guardian Yvonne Tyler is opted against any future surgical correction.  H/o syncope:  Prior episodes  of syncope most likely vasovagal in etiology. She has not had any recent syncope, With her h/o seizures, that is more likely to be the etiology.  That said, she remains at risk for arrhythmias including AV block, given her Down syndrome and ASD. I discussed with patient's cousin, legal guardian, Yvonne Tyler whether or not they would like to continue further workup.  Yvonne Tyler concerned that patient may pick at loop recorder, which would be risk for accidental explantation, as well as infection.  They also remain unsure whether or not they would like to consider pacemaker or ICD in future, should loop recorder show any dangerous arrhythmia findings.  Continue follow-up with PCP I will see her as needed   signed, Yvonne Das, MD

## 2024-01-29 ENCOUNTER — Ambulatory Visit (INDEPENDENT_AMBULATORY_CARE_PROVIDER_SITE_OTHER): Admitting: Student

## 2024-01-29 ENCOUNTER — Ambulatory Visit: Admitting: Student

## 2024-01-29 ENCOUNTER — Encounter: Payer: Self-pay | Admitting: Student

## 2024-01-29 VITALS — BP 123/73 | HR 58 | Ht 60.0 in | Wt 101.0 lb

## 2024-01-29 DIAGNOSIS — R197 Diarrhea, unspecified: Secondary | ICD-10-CM | POA: Diagnosis present

## 2024-01-29 DIAGNOSIS — R601 Generalized edema: Secondary | ICD-10-CM | POA: Diagnosis not present

## 2024-01-29 DIAGNOSIS — R410 Disorientation, unspecified: Secondary | ICD-10-CM

## 2024-01-29 MED ORDER — DOXEPIN HCL 10 MG PO CAPS: 10.0000 mg | ORAL_CAPSULE | Freq: Every day | ORAL | 0 refills | Status: DC

## 2024-01-29 NOTE — Assessment & Plan Note (Signed)
 Worsening over last 2 weeks, no evidence of acute infectious etiology other than recent diarrhea. Will check basic labs and recheck TSH given recent elevation as well as UA in future if still symptomatic. Doxepin ordered to help with sleep as it has a lower risk profile but could consider Seroquel.

## 2024-01-29 NOTE — Progress Notes (Signed)
    SUBJECTIVE:   CHIEF COMPLAINT / HPI:   Diarrhea:  Patient complains of diarrhea. Symptoms have been present for approximately 2 days.  The symptoms are gradually improving.  Stool frequency is approximately 2 per day. Diarrhea does not occur at night.  The patient has noted no bleeding associated with bowel movements.  The also patient reports the following symptoms: None  The patient denies the following symptoms: bloating, fever, and localized abdominal pain. The patient currently denies significant abdominal pain or discomfort.    Relationship to medications: the patient denies any relationship to medications.  Other risk factors: none.  Therapy tried so far: none.  Work up so far: none. On AED, nothing else.  TSH 05/30 >17 in ED  Foot Swelling: History of ASD, stable with cardiology  She experienced diarrhea twice over the weekend, which has since resolved. She consumes bland foods such as crackers, water, Gatorade, and applesauce. There is no vomiting, fever, chills, or blood in the stool. The diarrhea did not disturb her sleep and occurred only during the day.  Confusion has been present for the past couple of weeks, particularly at night. She gets up during the night, walks around, and appears lost. During the day, she is confused about directions, especially when going to the bathroom. Her daughter notes she talks to herself and knocks on the wall. This confusion has been more noticeable recently.  There is concern about swelling in her feet. She drinks fluids after every meal, but her daughter feels she might need more fluids. There is no chest pain or shortness of breath.  Her current medications include a seizure medication taken at night. She does not take melatonin as it does not help her sleep. There have been no recent antibiotics or other new medications.  PERTINENT  PMH / PSH:  Down syndrome  ASD Sz dx on AED  OBJECTIVE:   BP 123/73   Pulse (!) 58   Ht 5'  (1.524 m)   Wt 101 lb (45.8 kg)   LMP  (LMP Unknown) Comment: Mentaly challenged  SpO2 98%   BMI 19.73 kg/m   General: Alert in no apparent distress Heart: Regular rate and rhythm with no murmurs appreciated Lungs: CTA bilaterally, no wheezing Abdomen: Bowel sounds present, no abdominal pain Skin: Warm and dry Extremities: No lower extremity pitting edema, palpable DP pulses B/l equal mild diffuse foot swelling without redness or drainage   ASSESSMENT/PLAN:   Assessment & Plan Diarrhea, unspecified type BNP, CMP, CBC w diff, B12 ordered. Stopped yesterday, most likely infectious. Reassured as abdominal pain unremarkable. Eating more gradually, sticking to bland foods and remaining hydrated  Generalized edema BNP, CMP, CBC w diff, B12 ordered, no evidence of unilateral swelling or infection. Elevated feet as able  Disorientation Worsening over last 2 weeks, no evidence of acute infectious etiology other than recent diarrhea. Will check basic labs and recheck TSH given recent elevation as well as UA in future if still symptomatic. Doxepin ordered to help with sleep as it has a lower risk profile but could consider Seroquel.      Yvonne Headland, MD [facility]

## 2024-01-29 NOTE — Patient Instructions (Addendum)
 It was great to see you today! Thank you for choosing [facility] for your primary care.  Today we addressed: We will check some labs today I have ordered doxepin nightly Keep legs elevated  Return in 4 weeks   If you haven't already, sign up for My Chart to have easy access to your labs results, and communication with your primary care physician.   Please arrive 15 minutes before your appointment to ensure smooth check in process.  We appreciate your efforts in making this happen.  Thank you for allowing me to participate in your care, Ernestina Headland, MD 01/29/2024, 1:36 PM PGY-3, [facility]

## 2024-01-30 LAB — CBC WITH DIFFERENTIAL/PLATELET
Basophils Absolute: 0 10*3/uL (ref 0.0–0.2)
Basos: 1 %
EOS (ABSOLUTE): 0 10*3/uL (ref 0.0–0.4)
Eos: 0 %
Hematocrit: 40.1 % (ref 34.0–46.6)
Hemoglobin: 13.6 g/dL (ref 11.1–15.9)
Immature Grans (Abs): 0 10*3/uL (ref 0.0–0.1)
Immature Granulocytes: 0 %
Lymphocytes Absolute: 1.4 10*3/uL (ref 0.7–3.1)
Lymphs: 42 %
MCH: 36.1 pg — ABNORMAL HIGH (ref 26.6–33.0)
MCHC: 33.9 g/dL (ref 31.5–35.7)
MCV: 106 fL — ABNORMAL HIGH (ref 79–97)
Monocytes Absolute: 0.4 10*3/uL (ref 0.1–0.9)
Monocytes: 13 %
Neutrophils Absolute: 1.5 10*3/uL (ref 1.4–7.0)
Neutrophils: 44 %
Platelets: 240 10*3/uL (ref 150–450)
RBC: 3.77 x10E6/uL (ref 3.77–5.28)
RDW: 11.9 % (ref 11.7–15.4)
WBC: 3.4 10*3/uL (ref 3.4–10.8)

## 2024-01-30 LAB — COMPREHENSIVE METABOLIC PANEL WITH GFR
ALT: 11 IU/L (ref 0–32)
AST: 20 IU/L (ref 0–40)
Albumin: 3.8 g/dL — ABNORMAL LOW (ref 3.9–4.9)
Alkaline Phosphatase: 61 IU/L (ref 44–121)
BUN/Creatinine Ratio: 6 — ABNORMAL LOW (ref 12–28)
BUN: 6 mg/dL — ABNORMAL LOW (ref 8–27)
Bilirubin Total: 0.6 mg/dL (ref 0.0–1.2)
CO2: 25 mmol/L (ref 20–29)
Calcium: 8.7 mg/dL (ref 8.7–10.3)
Chloride: 104 mmol/L (ref 96–106)
Creatinine, Ser: 0.99 mg/dL (ref 0.57–1.00)
Globulin, Total: 3.1 g/dL (ref 1.5–4.5)
Glucose: 98 mg/dL (ref 70–99)
Potassium: 4.2 mmol/L (ref 3.5–5.2)
Sodium: 144 mmol/L (ref 134–144)
Total Protein: 6.9 g/dL (ref 6.0–8.5)
eGFR: 62 mL/min/{1.73_m2} (ref >59)

## 2024-01-30 LAB — BRAIN NATRIURETIC PEPTIDE: BNP: 156.4 pg/mL — ABNORMAL HIGH (ref 0.0–100.0)

## 2024-01-30 LAB — VITAMIN B12: Vitamin B-12: 339 pg/mL (ref 232–1245)

## 2024-01-30 LAB — TSH RFX ON ABNORMAL TO FREE T4: TSH: 3.68 u[IU]/mL (ref 0.450–4.500)

## 2024-02-02 ENCOUNTER — Ambulatory Visit: Payer: Self-pay | Admitting: Student

## 2024-02-05 ENCOUNTER — Encounter: Payer: Self-pay | Admitting: *Deleted

## 2024-02-12 ENCOUNTER — Encounter: Payer: Self-pay | Admitting: Student

## 2024-02-18 ENCOUNTER — Encounter: Payer: Self-pay | Admitting: Student

## 2024-02-18 ENCOUNTER — Ambulatory Visit (INDEPENDENT_AMBULATORY_CARE_PROVIDER_SITE_OTHER): Admitting: Student

## 2024-02-18 ENCOUNTER — Ambulatory Visit (HOSPITAL_COMMUNITY)
Admission: RE | Admit: 2024-02-18 | Discharge: 2024-02-18 | Disposition: A | Discharge status: Home / Self Care | Source: Ambulatory Visit | Attending: Family Medicine | Admitting: Family Medicine

## 2024-02-18 VITALS — BP 88/60 | HR 64 | Ht 60.0 in | Wt 99.2 lb

## 2024-02-18 DIAGNOSIS — I959 Hypotension, unspecified: Secondary | ICD-10-CM | POA: Diagnosis present

## 2024-02-18 DIAGNOSIS — R799 Abnormal finding of blood chemistry, unspecified: Secondary | ICD-10-CM | POA: Diagnosis not present

## 2024-02-18 LAB — POCT URINALYSIS DIP (MANUAL ENTRY)
Bilirubin, UA: NEGATIVE
Blood, UA: NEGATIVE
Glucose, UA: NEGATIVE mg/dL
Ketones, POC UA: NEGATIVE mg/dL
Nitrite, UA: NEGATIVE
Protein Ur, POC: NEGATIVE mg/dL
Spec Grav, UA: 1.015 (ref 1.010–1.025)
Urobilinogen, UA: 0.2 U/dL
pH, UA: 6.5 (ref 5.0–8.0)

## 2024-02-18 LAB — POCT UA - MICROSCOPIC ONLY

## 2024-02-18 NOTE — Progress Notes (Signed)
    SUBJECTIVE:   CHIEF COMPLAINT / HPI:   Follow Up Weight Loss:  Accompanied by caregiver She has lost weight from 112 pounds last year to 99 pounds currently, with a noted decrease in food intake. Despite this, she eats meals such as oatmeal, sausage, pancakes, and 'hungry man dinners' without difficulty when food is offered.  She experiences sleep disturbances, waking multiple times during the night, often at 4 AM and 5 AM, and sometimes ends up in her mother's room. Sleep medication taken around 9 or 10 PM is ineffective, and she remains sleepy during the day, often falling asleep.  She sometimes becomes disoriented, leading to inappropriate urination locations such as the living room or at the foot of the bed. She experiences frequent urination.  Her blood pressure is low, around 85 mmHg, which is atypical for her. She is not on any blood pressure medications. No abdominal pain is reported. No new changes aside from the weight loss and sleep issues.  PERTINENT  PMH / PSH: Reviewed   OBJECTIVE:   BP (!) 88/60   Pulse 64   Ht 5' (1.524 m)   Wt 99 lb 3.2 oz (45 kg)   LMP  (LMP Unknown) Comment: Mentaly challenged  SpO2 100%   BMI 19.37 kg/m   General: Alert in no apparent distress Heart: Regular rate and rhythm with no murmurs appreciated Lungs: CTA bilaterally, no wheezing Abdomen: Bowel sounds present, no abdominal pain Skin: Warm and dry Extremities: No lower extremity edema  EKG: Sinus brady - under ECG tab    ASSESSMENT/PLAN:   Assessment & Plan Hypotension, unspecified hypotension type Unknown etiology but no evidence of infectious source, UA pending. Well appearing despite hypotensive episode and ambulating with no difficulty. Has ASD that is possibly contributing, EKG without rhythm abnormalities. Obtain LDH, inflammatory marker, ferritin. Labs from two weeks ago with normal CBC and CMP. STOP doxepin . Discussed strict return precautions. Scheduled follow up  tomorrow to reassess BP. Get CBG if indicated as well. Not on any meds to cause this.       Laurier Hugger, MD [facility]

## 2024-02-18 NOTE — Patient Instructions (Addendum)
 It was great to see you today! Thank you for choosing [facility] for your primary care.  Today we addressed: - make sure to remain hydrated  - I will make appt for tomorrow for repeat check up  - STOP doxepin   If you haven't already, sign up for My Chart to have easy access to your labs results, and communication with your primary care physician.   Please arrive 15 minutes before your appointment to ensure smooth check in process.  We appreciate your efforts in making this happen.  Thank you for allowing me to participate in your care, Laurier Hugger, MD 02/18/2024, 2:57 PM PGY-3, [facility]

## 2024-02-19 ENCOUNTER — Ambulatory Visit: Payer: Self-pay | Admitting: Student

## 2024-02-19 ENCOUNTER — Ambulatory Visit: Payer: Self-pay

## 2024-02-19 ENCOUNTER — Ambulatory Visit: Payer: Self-pay | Admitting: Family Medicine

## 2024-02-19 VITALS — BP 98/64 | HR 60 | Wt 99.0 lb

## 2024-02-19 DIAGNOSIS — G479 Sleep disorder, unspecified: Secondary | ICD-10-CM

## 2024-02-19 DIAGNOSIS — I959 Hypotension, unspecified: Secondary | ICD-10-CM

## 2024-02-19 LAB — SEDIMENTATION RATE: Sed Rate: 15 mm/h (ref 0–40)

## 2024-02-19 LAB — FERRITIN: Ferritin: 299 ng/mL — ABNORMAL HIGH (ref 15–150)

## 2024-02-19 LAB — LACTATE DEHYDROGENASE: LDH: 198 IU/L (ref 119–226)

## 2024-02-19 NOTE — Progress Notes (Signed)
    SUBJECTIVE:   CHIEF COMPLAINT / HPI:   Yvonne Tyler is a 68yo F w/ hx of trisomy21 that pf BP f/u. - Was seen yesterday in clinic for follow-up.  He was also noted to have low blood pressure.  EKG at that time was unremarkable. - BP back to normal today. Pt is drinking and eating well. - Pt is present w/ cousin Yvonne Tyler. She reports that pt has been having sleep issues. She is having trouble falling asleep and is seeming disoriented at night.  She has been doing abnormal behaviors such as putting close in the toilet, which is abnormal for her. - They previously tried doxepin  for sleep, but stopped because it was not helpful.  OBJECTIVE:   BP 98/62   Pulse (!) 54   LMP  (LMP Unknown) Comment: Mentaly challenged  SpO2 96%   General: Alert, pleasant woman. NAD. HEENT: NCAT. MMM. CV: RRR, no murmurs.  Resp: CTAB, no wheezing or crackles. Normal WOB on RA.  Abm: Soft, nontender, nondistended. BS present. Ext: Moves all ext spontaneously Skin: Warm, well perfused   ASSESSMENT/PLAN:   Assessment & Plan Hypotension, unspecified hypotension type Back to normal today. Suspect it was heat/dehydration related. Encouraged regular hydration Sleep disturbances Symptoms could be sundowning from underlying dementia. Will send to geri clinic for further evauluation of dementia, however it may be difficult with pt underlying trisomy 11.  - f/u in Geri clinic     Yvonne Nearing, MD [facility]

## 2024-02-19 NOTE — Patient Instructions (Addendum)
 Good to see you today - Thank you for coming in  Things we discussed today:  1) Your blood pressure is much better today!  - Make sure that you are staying hydrated and cool during the summer heat. - Aim to drink at least 2 liters of water a day. - Avoid going out during the hottest parts of the day around 10am-4pm  2) We are going to get you scheduled in our Geriatric clinic to see if dementia is contributing to her changes in behavior.  - Try to avoid napping during the day.  This can help with her sleep at nighttime. - To get her daytime to keep her active, this can also help with adjusting her sleep schedule.  Please seek further medical attention if you: - have trouble breathing - have chest pain - lose consciousness or feel close to fainting - are vomiting uncontrollably - are unable to drink enough to stay hydrated

## 2024-02-25 ENCOUNTER — Encounter: Payer: Self-pay | Admitting: Student

## 2024-03-05 ENCOUNTER — Ambulatory Visit: Admitting: Podiatry

## 2024-03-12 ENCOUNTER — Ambulatory Visit (INDEPENDENT_AMBULATORY_CARE_PROVIDER_SITE_OTHER): Admitting: Podiatry

## 2024-03-12 DIAGNOSIS — H548 Legal blindness, as defined in USA: Secondary | ICD-10-CM

## 2024-03-12 DIAGNOSIS — M79674 Pain in right toe(s): Secondary | ICD-10-CM

## 2024-03-12 DIAGNOSIS — B351 Tinea unguium: Secondary | ICD-10-CM

## 2024-03-12 NOTE — Progress Notes (Signed)
Complaint:  Visit Type: Patient returns to my office for continued preventative foot care services. Complaint: Patient states" my nails have grown long and thick and become painful to walk and wear shoes"  The patient presents for preventative foot care services. No changes to ROS.  Patient presents to the office with her caregiver..  Podiatric Exam: Vascular: dorsalis pedis and posterior tibial pulses are palpable bilateral. Capillary return is immediate. Temperature gradient is WNL. Skin turgor WNL  Sensorium: Normal Semmes Weinstein monofilament test. Normal tactile sensation bilaterally. Nail Exam: Pt has thick disfigured discolored nails with subungual debris noted bilateral entire nail hallux through fifth toenails,  Especially right great toenail. Ulcer Exam: There is no evidence of ulcer or pre-ulcerative changes or infection. Orthopedic Exam: Muscle tone and strength are WNL. No limitations in general ROM. No crepitus or effusions noted. HAV  B/L.  Hammer toes  B/L. Skin: No Porokeratosis. No infection or ulcers  Diagnosis:  Onychomycosis, , Pain in right toe, pain in left toes  Treatment & Plan Procedures and Treatment: Consent by patient was obtained for treatment procedures.   Debridement of mycotic and hypertrophic toenails, 1 through 5 bilateral and clearing of subungual debris. No ulceration, no infection noted.  Return Visit-Office Procedure: Patient instructed to return to the office for a follow up visit  3   months for continued evaluation and treatment.    Gardiner Barefoot DPM

## 2024-03-21 ENCOUNTER — Telehealth: Payer: Self-pay

## 2024-03-21 NOTE — Telephone Encounter (Signed)
 Clinical info completed on Adult Day[employer] form.  Placed form in PCP's box for completion.    When form is completed, please route note to RN Team and place in wall pocket in front office.   Nelson Land, CMA

## 2024-03-24 NOTE — Telephone Encounter (Signed)
 Form placed up front for pick up.   Copy made for batch scanning.   Patients family has been made aware.

## 2024-03-31 ENCOUNTER — Ambulatory Visit

## 2024-04-07 ENCOUNTER — Telehealth: Payer: Self-pay

## 2024-04-07 NOTE — Telephone Encounter (Signed)
 Charde with [employer] calls nurse line in regards to medical form.   She reports the patient is enrolling in their day program, however they are needing some documentation from us , medication list, problem list etc...  She reports she will be faxing this over to PCP to fill out and sign.   Advised she may need a visit depending on review of form.   Will forward to PCP to make aware.

## 2024-04-10 NOTE — Telephone Encounter (Signed)
 Spoke with Yvonne Tyler.  Confirmed Adult Day Care form was received.  No further action needed.

## 2024-05-01 ENCOUNTER — Telehealth: Payer: Self-pay

## 2024-05-01 NOTE — Telephone Encounter (Signed)
Clinical info completed on Transportation form.  Placed form in PCP's box for completion.    When form is completed, please route note to "RN Team" and place in wall pocket in front office.   Aquilla Solian, CMA

## 2024-05-01 NOTE — Telephone Encounter (Signed)
 Patients provider dropped off form at front desk for transportation.  Verified that patient section of form has been completed.  Last DOS/WCC with PCP was 02/19/2024.  Placed form in blue team folder to be completed by clinical staff.  Yvonne Tyler

## 2024-05-01 NOTE — Telephone Encounter (Signed)
 [facility] [employer] calls regarding a form needed to be completed by [place]. It is the [employer] [place] form, which is different than the previous medical form completed for [facility]. Someone will be dropping this form off at the front desk today. Can we get this completed as soon as possible, as its for the patients transportation. Please advise.

## 2024-05-05 NOTE — Progress Notes (Signed)
 Completed [employer] [place] form for patient. Patient is legally blind, has trisomy 22, and seizure disorder. Needs person assist for ambulation and 1 on 1 sitter for transportation given learning disability. Has had recent episode of waxing/waning mentation.  Dr. Fairy Amy, MD [facility] Resident, PGY-1

## 2024-05-06 NOTE — Telephone Encounter (Addendum)
 Form placed up front for pick up.   Copy made for batch scanning.   VM left at number provided, advised of completion.

## 2024-05-08 ENCOUNTER — Ambulatory Visit (INDEPENDENT_AMBULATORY_CARE_PROVIDER_SITE_OTHER): Payer: Medicare Other | Admitting: Neurology

## 2024-05-08 VITALS — BP 104/72 | HR 74 | Wt 102.0 lb

## 2024-05-08 DIAGNOSIS — G40909 Epilepsy, unspecified, not intractable, without status epilepticus: Secondary | ICD-10-CM

## 2024-05-08 DIAGNOSIS — Q909 Down syndrome, unspecified: Secondary | ICD-10-CM | POA: Diagnosis not present

## 2024-05-08 MED ORDER — DIVALPROEX SODIUM ER 500 MG PO TB24: 500.0000 mg | ORAL_TABLET | Freq: Every day | ORAL | 4 refills | Status: AC

## 2024-05-08 NOTE — Progress Notes (Signed)
 Patient: Yvonne Tyler Date of Birth: [birth year]  Reason for Visit: Follow up History from: Caregiver Primary Neurologist: Onita  ASSESSMENT AND PLAN 68 y.o. year old female   Down Syndrome Dementia Seizure  - No recurrent seizure since starting Depakote  - Continue Depakote  ER 500 mg at bedtime for seizure prevention - Check routine labs today - Call for seizure activity, follow-up in 1 year  - Routine EEG October 2024 showed mild background slowing  HISTORY  Yvonne Tyler is a 68 year old female, accompanied by her cousin Lucienne, who is also her caregiver seen in request by   her primary care doctor Delores Suzann HERO for evaluation of passing out episode, initial evaluation May 09, 2023   I reviewed and summarized the referring note. PMHX Down syndrome Blindness  HLD Circadian rhythm    Lucienne is her first degree maternal cousin, known [place] all her life, she lost her vision many years ago, there was reported a history of cataract, she is pleasant, like self mumbling,   Over the years, she has developed gradual onset of memory loss, got lost in her own room   She has no history of seizure, but since 2022, she began to have spells of sudden onset of loss of consciousness, body stiff up, sometimes with bladder incontinence, each episode last for few minutes, rarely happen, couple times each year   Personally reviewed MRI of the brain without contrast from April 17, 2023, moderate small vessel disease, multi foci of chronic cerebral hemorrhage, involving right greater than left anterior frontal lobe, with encephalomalacia at posterior left temporal, also 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 No further seizure  events. Previously occurring every 6 months. Tolerating the Depakote  well.   REVIEW OF SYSTEMS: Out of a complete 14 system review of symptoms, the patient complains only of the following symptoms, and all other reviewed systems are negative.  See HPI  ALLERGIES: No Known Allergies  HOME MEDICATIONS: Outpatient Medications Prior to Visit  Medication Sig Dispense Refill   divalproex  (DEPAKOTE  ER) 500 MG 24 hr tablet Take 1 tablet (500 mg total) by mouth at bedtime. 30 tablet 11   No facility-administered medications prior to visit.    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   Vitamin B12 deficiency 01/16/2020    PAST SURGICAL HISTORY: No past surgical history on file.  FAMILY HISTORY: Family History  Problem Relation Age of Onset   Colon cancer Neg Hx    Esophageal cancer Neg Hx    Rectal cancer Neg Hx    Prostate cancer Neg Hx    Breast cancer Neg Hx     SOCIAL HISTORY: Social History   Socioeconomic History   Marital status: Single    Spouse name: Not on file   Number of children: 0   Years of education: Not on file   Highest education level: Not on file  Occupational History  Not on file  Tobacco Use   Smoking status: Never    Passive exposure: Never   Smokeless tobacco: Never  Vaping Use   Vaping status: Never Used  Substance and Sexual Activity   Alcohol use: Yes    Comment: Beer once a year on her birthday.   Drug use: No   Sexual activity: Never  Other Topics Concern   Not on file  Social History Narrative       Patient is legally blind and has diagnosis of down Syndrome.  lives with aunt Edwin and cousin since mother passed  away as a teenager. Patient very independent of ADL's.     Family moved to [place] from [place]  about 15 years ago.  Patient not participated in any day programs since moving to [facility].  She participated in day programs in [place]  but no longer wants to go.    Patient has a doll that she enjoys, she also enjoys talking and Riding in the car. Primary family support persons is cousin [name]. Transportation to appointments provided by cousin Lucienne, who is her primary caretaker.   Patient does not have a legal guardian.         Social Drivers of Corporate investment banker Strain: Low Risk  (10/26/2023)   Overall Financial Resource Strain (CARDIA)    Difficulty of Paying Living Expenses: Not hard at all  Food Insecurity: No Food Insecurity (10/26/2023)   Hunger Vital Sign    Worried About Running Out of Food in the Last Year: Never true    Ran Out of Food in the Last Year: Never true  Transportation Needs: No Transportation Needs (10/26/2023)   PRAPARE - Administrator, Civil Service (Medical): No    Lack of Transportation (Non-Medical): No  Physical Activity: Inactive (10/26/2023)   Exercise Vital Sign    Days of Exercise per Week: 0 days    Minutes of Exercise per Session: 0 min  Stress: No Stress Concern Present (10/26/2023)   [employer] of Occupational Health - Occupational Stress Questionnaire    Feeling of Stress : Not at all  Social Connections: Socially Isolated (10/26/2023)   Social Connection and Isolation Panel    Frequency of Communication with Friends and Family: Never    Frequency of Social Gatherings with Friends and Family: More than three times a week    Attends Religious Services: Never    Database administrator or Organizations: No    Attends Banker Meetings: Never    Marital Status: Never married  Intimate Partner Violence: Not At Risk (10/26/2023)   Humiliation, Afraid, Rape, and Kick questionnaire    Fear of Current or Ex-Partner: No     Emotionally Abused: No    Physically Abused: No    Sexually Abused: No    PHYSICAL EXAM  Vitals:   05/08/24 1130  BP: 104/72  Pulse: 74  SpO2: 97%  Weight: 102 lb (46.3 kg)   Body mass index is 19.92 kg/m.  Generalized: Well developed, in no acute distress  Neurological examination  Mentation: Alert, caregiver provides history, noted to be mumbling, cooperates for exam and visit Cranial nerve II-XII: Opaque left eye.  Blind bilaterally.  Facial sensation and strength were normal.  Head turning and shoulder shrug  were normal and symmetric. Motor: Moves all extremities well Sensory: Sensory testing is intact to soft touch on all 4 extremities. No evidence of extinction is noted.  Gait and station: In a wheelchair, gait  limited by blindness  DIAGNOSTIC DATA (LABS, IMAGING, TESTING) - I reviewed patient records, labs, notes, testing and imaging myself where available.  Lab Results  Component Value Date   WBC 3.4 01/29/2024   HGB 13.6 01/29/2024   HCT 40.1 01/29/2024   MCV 106 (H) 01/29/2024   PLT 240 01/29/2024      Component Value Date/Time   NA 144 01/29/2024 1405   K 4.2 01/29/2024 1405   CL 104 01/29/2024 1405   CO2 25 01/29/2024 1405   GLUCOSE 98 01/29/2024 1405   GLUCOSE 97 11/25/2023 1436   BUN 6 (L) 01/29/2024 1405   CREATININE 0.99 01/29/2024 1405   CREATININE 1.05 05/09/2012 1059   CALCIUM 8.7 01/29/2024 1405   PROT 6.9 01/29/2024 1405   ALBUMIN 3.8 (L) 01/29/2024 1405   AST 20 01/29/2024 1405   ALT 11 01/29/2024 1405   ALKPHOS 61 01/29/2024 1405   BILITOT 0.6 01/29/2024 1405   GFRNONAA 58 (L) 11/25/2023 1436   GFRNONAA 60 05/09/2012 1059   GFRAA 71 12/04/2019 1357   GFRAA 69 05/09/2012 1059   Lab Results  Component Value Date   CHOL 200 (H) 07/12/2022   HDL 74 07/12/2022   LDLCALC 98 07/12/2022   TRIG 164 (H) 07/12/2022   CHOLHDL 2.7 07/12/2022   No results found for: HGBA1C Lab Results  Component Value Date   VITAMINB12 339 01/29/2024    Lab Results  Component Value Date   TSH 3.680 01/29/2024    Lauraine Born, AGNP-C, DNP 05/08/2024, 11:33 AM [facility] [address] [place], [place] [address] [phone]

## 2024-05-08 NOTE — Patient Instructions (Signed)
 Great to see you today! Continue Depakote  for seizure prevention Check labs today Call for seizures.  Follow-up in 1 year.  Thanks!!

## 2024-05-09 LAB — COMPREHENSIVE METABOLIC PANEL WITH GFR
ALT: 11 IU/L (ref 0–32)
AST: 19 IU/L (ref 0–40)
Albumin: 3.7 g/dL — ABNORMAL LOW (ref 3.9–4.9)
Alkaline Phosphatase: 54 IU/L (ref 44–121)
BUN/Creatinine Ratio: 12 (ref 12–28)
BUN: 13 mg/dL (ref 8–27)
Bilirubin Total: 0.6 mg/dL (ref 0.0–1.2)
CO2: 26 mmol/L (ref 20–29)
Calcium: 8.6 mg/dL — ABNORMAL LOW (ref 8.7–10.3)
Chloride: 102 mmol/L (ref 96–106)
Creatinine, Ser: 1.1 mg/dL — ABNORMAL HIGH (ref 0.57–1.00)
Globulin, Total: 3.1 g/dL (ref 1.5–4.5)
Glucose: 71 mg/dL (ref 70–99)
Potassium: 4.8 mmol/L (ref 3.5–5.2)
Sodium: 140 mmol/L (ref 134–144)
Total Protein: 6.8 g/dL (ref 6.0–8.5)
eGFR: 55 mL/min/1.73 — ABNORMAL LOW (ref >59)

## 2024-05-09 LAB — CBC WITH DIFFERENTIAL/PLATELET
Basophils Absolute: 0.1 x10E3/uL (ref 0.0–0.2)
Basos: 2 %
EOS (ABSOLUTE): 0 x10E3/uL (ref 0.0–0.4)
Eos: 1 %
Hematocrit: 39.3 % (ref 34.0–46.6)
Hemoglobin: 13.5 g/dL (ref 11.1–15.9)
Immature Grans (Abs): 0 x10E3/uL (ref 0.0–0.1)
Immature Granulocytes: 0 %
Lymphocytes Absolute: 2.4 x10E3/uL (ref 0.7–3.1)
Lymphs: 62 %
MCH: 37.1 pg — ABNORMAL HIGH (ref 26.6–33.0)
MCHC: 34.4 g/dL (ref 31.5–35.7)
MCV: 108 fL — ABNORMAL HIGH (ref 79–97)
Monocytes Absolute: 0.4 x10E3/uL (ref 0.1–0.9)
Monocytes: 10 %
Neutrophils Absolute: 0.9 x10E3/uL — ABNORMAL LOW (ref 1.4–7.0)
Neutrophils: 25 %
Platelets: 174 x10E3/uL (ref 150–450)
RBC: 3.64 x10E6/uL — ABNORMAL LOW (ref 3.77–5.28)
RDW: 11.8 % (ref 11.7–15.4)
WBC: 3.8 x10E3/uL (ref 3.4–10.8)

## 2024-05-09 LAB — VALPROIC ACID LEVEL: Valproic Acid Lvl: 56 ug/mL (ref 50–100)

## 2024-05-13 ENCOUNTER — Ambulatory Visit: Payer: Self-pay | Admitting: Neurology

## 2024-05-19 ENCOUNTER — Telehealth: Payer: Self-pay

## 2024-05-19 NOTE — Telephone Encounter (Signed)
 Patients cousin dropped off form at front desk for [employer].  Verified that patient section of form has been completed.  Last DOS/WCC with PCP was 02/19/2024.  Placed form in [facility] team folder to be completed by clinical staff.  Yvonne Tyler

## 2024-05-19 NOTE — Telephone Encounter (Signed)
 Clinical info completed on [employer] form.  Placed form in PCP's box for completion.    When form is completed, please route note to RN Team and place in wall pocket in front office.   Nelson Land, CMA

## 2024-05-20 NOTE — Telephone Encounter (Signed)
 I Spoke with caregiver of patient. She stated that the first was for her Adult daycare, and now this second one is for actual [employer] transportation. Nelson Land, CMA

## 2024-05-22 NOTE — Telephone Encounter (Signed)
 Patient's cousin, Lucienne, called and informed that forms are ready for pick up. Copy made and placed in batch scanning. Original placed at front desk for pick up.   Chiquita JAYSON English, RN

## 2024-06-12 ENCOUNTER — Encounter

## 2024-07-01 ENCOUNTER — Encounter: Payer: Self-pay | Admitting: Podiatry

## 2024-07-01 ENCOUNTER — Ambulatory Visit: Admitting: Podiatry

## 2024-07-01 DIAGNOSIS — H548 Legal blindness, as defined in USA: Secondary | ICD-10-CM

## 2024-07-01 DIAGNOSIS — B351 Tinea unguium: Secondary | ICD-10-CM | POA: Diagnosis not present

## 2024-07-01 DIAGNOSIS — M79674 Pain in right toe(s): Secondary | ICD-10-CM | POA: Diagnosis not present

## 2024-07-01 NOTE — Progress Notes (Signed)
Complaint:  Visit Type: Patient returns to my office for continued preventative foot care services. Complaint: Patient states" my nails have grown long and thick and become painful to walk and wear shoes"  The patient presents for preventative foot care services. No changes to ROS.  Patient presents to the office with her caregiver..  Podiatric Exam: Vascular: dorsalis pedis and posterior tibial pulses are palpable bilateral. Capillary return is immediate. Temperature gradient is WNL. Skin turgor WNL  Sensorium: Normal Semmes Weinstein monofilament test. Normal tactile sensation bilaterally. Nail Exam: Pt has thick disfigured discolored nails with subungual debris noted bilateral entire nail hallux through fifth toenails,  Especially right great toenail. Ulcer Exam: There is no evidence of ulcer or pre-ulcerative changes or infection. Orthopedic Exam: Muscle tone and strength are WNL. No limitations in general ROM. No crepitus or effusions noted. HAV  B/L.  Hammer toes  B/L. Skin: No Porokeratosis. No infection or ulcers  Diagnosis:  Onychomycosis, , Pain in right toe, pain in left toes  Treatment & Plan Procedures and Treatment: Consent by patient was obtained for treatment procedures.   Debridement of mycotic and hypertrophic toenails, 1 through 5 bilateral and clearing of subungual debris. No ulceration, no infection noted.  Return Visit-Office Procedure: Patient instructed to return to the office for a follow up visit  3   months for continued evaluation and treatment.    Gardiner Barefoot DPM

## 2024-07-17 ENCOUNTER — Encounter

## 2024-08-25 ENCOUNTER — Ambulatory Visit

## 2024-08-25 ENCOUNTER — Telehealth: Payer: Self-pay

## 2024-08-25 VITALS — Ht 60.0 in | Wt 102.0 lb

## 2024-08-25 DIAGNOSIS — Z0000 Encounter for general adult medical examination without abnormal findings: Secondary | ICD-10-CM

## 2024-08-25 NOTE — Progress Notes (Signed)
 "  Chief Complaint  Patient presents with   Medicare Wellness    SUBSEQUENT     Subjective:   Yvonne Tyler is a 68 y.o. female who presents for a Medicare Annual Wellness Visit.  Visit info / Clinical Intake: Medicare Wellness Visit Type:: Subsequent Annual Wellness Visit Persons participating in visit and providing information:: patient & caregiver Lillis Chuck) Medicare Wellness Visit Mode:: Video Since this visit was completed virtually, some vitals may be partially provided or unavailable. Missing vitals are due to the limitations of the virtual format.: Documented vitals are patient reported If Telephone or Video please confirm:: I connected with patient using audio/video enable telemedicine. I verified patient identity with two identifiers, discussed telehealth limitations, and patient agreed to proceed. Patient Location:: HOME Provider Location:: HOME OFFICE Interpreter Needed?: No Pre-visit prep was completed: yes AWV questionnaire completed by patient prior to visit?: no Living arrangements:: with family/others Patient's Overall Health Status Rating: good Typical amount of pain: none Does pain affect daily life?: no Are you currently prescribed opioids?: no  Dietary Habits and Nutritional Risks How many meals a day?: 3 Eats fruit and vegetables daily?: yes Most meals are obtained by: having others provide food In the last 2 weeks, have you had any of the following?: none Diabetic:: no  Functional Status Activities of Daily Living (to include ambulation/medication): (!) Needs Assist Feeding: Independent Dressing/Grooming: Needs assistance Bathing: Needs assistance Toileting: Independent Transfer: Independent Ambulation: Independent Medication Administration: Needs assistance (comment) Home Management (perform basic housework or laundry): Needs assistance (comment) Manage your own finances?: (!) no Primary transportation is: family / friends Concerns  about vision?: (!) yes (LEGALLY BLIND) Concerns about hearing?: no  Fall Screening Falls in the past year?: 0 Number of falls in past year: 0 Was there an injury with Fall?: 0 Fall Risk Category Calculator: 0 Patient Fall Risk Level: Low Fall Risk  Fall Risk Patient at Risk for Falls Due to: Impaired vision Fall risk Follow up: Falls evaluation completed; Education provided  Home and Transportation Safety: All rugs have non-skid backing?: N/A, no rugs (BELLS ON THE DOORS) All stairs or steps have railings?: N/A, no stairs Grab bars in the bathtub or shower?: (!) no Have non-skid surface in bathtub or shower?: yes Good home lighting?: yes Regular seat belt use?: yes Hospital stays in the last year:: no  Cognitive Assessment Difficulty concentrating, remembering, or making decisions? : yes Will 6CIT or Mini Cog be Completed: yes 6CIT or Mini Cog Declined: patient has a diagnosis of dementia or cognitive impairment What year is it?: 4 points What month is it?: 3 points Give patient an address phrase to remember (5 components): PATIENT HAS DOWN'S SYNDROME About what time is it?: 3 points Count backwards from 20 to 1: 4 points (1-5, NOT BACKWARDS) Say the months of the year in reverse: 4 points Repeat the address phrase from earlier: 10 points 6 CIT Score: 28 points  Advance Directives (For Healthcare) Does Patient Have a Medical Advance Directive?: No Would patient like information on creating a medical advance directive?: No - Patient declined  Reviewed/Updated  Reviewed/Updated: Reviewed All (Medical, Surgical, Family, Medications, Allergies, Care Teams, Patient Goals)    Allergies (verified) Patient has no known allergies.   Current Medications (verified) Outpatient Encounter Medications as of 08/25/2024  Medication Sig   divalproex  (DEPAKOTE  ER) 500 MG 24 hr tablet Take 1 tablet (500 mg total) by mouth at bedtime.   No facility-administered encounter medications  on file as of 08/25/2024.  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   Vitamin B12 deficiency 01/16/2020   History reviewed. No pertinent surgical history. Family History  Problem Relation Age of Onset   Colon cancer Neg Hx    Esophageal cancer Neg Hx    Rectal cancer Neg Hx    Prostate cancer Neg Hx    Breast cancer Neg Hx    Social History   Occupational History   Not on file  Tobacco Use   Smoking status: Never    Passive exposure: Never   Smokeless tobacco: Never  Vaping Use   Vaping status: Never Used  Substance and Sexual Activity   Alcohol use: Yes    Comment: Beer once a year on her birthday.   Drug use: No   Sexual activity: Never   Tobacco Counseling Counseling given: Not Answered  SDOH Screenings   Food Insecurity: No Food Insecurity (08/25/2024)  Housing: Low Risk (08/25/2024)  Transportation Needs: No Transportation Needs (08/25/2024)  Utilities: Not At Risk (08/25/2024)  Alcohol Screen: Low Risk (10/26/2023)  Depression (PHQ2-9): Low Risk (02/19/2024)  Financial Resource Strain: Low Risk (10/26/2023)  Physical Activity: Insufficiently Active (08/25/2024)  Social Connections: Socially Isolated (08/25/2024)  Stress: No Stress Concern Present (08/25/2024)  Tobacco Use: Low Risk (08/25/2024)  Health Literacy: Inadequate Health Literacy (08/25/2024)   See flowsheets for full screening details  Depression Screen Depression Screening Exception Documentation Depression Screening Exception:: Other- indicate reason in comment box Depression Screening Exception Comment:: PATIENT HAS DOWNS SYNDROME  PHQ 2 & 9 Depression Scale- Over the past 2 weeks, how  often have you been bothered by any of the following problems? Little interest or pleasure in doing things: 0 Feeling down, depressed, or hopeless (PHQ Adolescent also includes...irritable): 0 PHQ-2 Total Score: 0 Trouble falling or staying asleep, or sleeping too much: 2 Feeling tired or having little energy: 0 Poor appetite or overeating (PHQ Adolescent also includes...weight loss): 0 Feeling bad about yourself - or that you are a failure or have let yourself or your family down: 0 Trouble concentrating on things, such as reading the newspaper or watching television (PHQ Adolescent also includes...like school work): 0 Moving or speaking so slowly that other people could have noticed. Or the opposite - being so fidgety or restless that you have been moving around a lot more than usual: 2 Thoughts that you would be better off dead, or of hurting yourself in some way: 0 PHQ-9 Total Score: 4 If you checked off any problems, how difficult have these problems made it for you to do your work, take care of things at home, or get along with other people?: Very difficult     Goals Addressed             This Visit's Progress    Per legal guardian: Just to maintain and do the best she can.               Objective:    Today's Vitals   08/25/24 1508  Weight: 102 lb (46.3 kg)  Height: 5' (1.524 m)  PainSc: 0-No pain   Body mass index is 19.92 kg/m.  Hearing/Vision screen Vision Screening -  Comments:: LEGALLY BLIND DUE TO DOWNS SYNDROME Immunizations and Health Maintenance Health Maintenance  Topic Date Due   Zoster Vaccines- Shingrix  (1 of 2) Never done   Pneumococcal Vaccine: 50+ Years (2 of 2 - PCV) 08/30/2022   Influenza Vaccine  03/28/2024   COVID-19 Vaccine (4 - 2025-26 season) 04/28/2024   Medicare Annual Wellness (AWV)  08/25/2025   Mammogram  11/18/2025   Colonoscopy  02/15/2030   Bone Density Scan  Completed   Hepatitis C Screening  Completed   Meningococcal B  Vaccine  Aged Out   DTaP/Tdap/Td  Discontinued        Assessment/Plan:  This is a routine wellness examination for Yvonne Tyler.  Patient Care Team: Lorrane Pac, MD as PCP - General (Family Medicine) Elmira Newman PARAS, MD as PCP - Cardiology (Cardiology) Bryan Bianchi, MD (Inactive) (Family Medicine) Loreda Hacker, DPM as Consulting Physician (Podiatry) Gayland Lauraine PARAS, NP as Nurse Practitioner (Neurology)  I have personally reviewed and noted the following in the patients chart:   Medical and social history Use of alcohol, tobacco or illicit drugs  Current medications and supplements including opioid prescriptions. Functional ability and status Nutritional status Physical activity Advanced directives List of other physicians Hospitalizations, surgeries, and ER visits in previous 12 months Vitals Screenings to include cognitive, depression, and falls Referrals and appointments  No orders of the defined types were placed in this encounter.  In addition, I have reviewed and discussed with patient certain preventive protocols, quality metrics, and best practice recommendations. A written personalized care plan for preventive services as well as general preventive health recommendations were provided to patient.   Yvonne LOISE Fuller, LPN   [id number]/[id number]   Return in about 1 year (around 08/25/2025) for Medicare wellness.  After Visit Summary: (MyChart) Due to this being a telephonic visit, the after visit summary with patients personalized plan was offered to patient via MyChart   Nurse Notes:  HM Addressed: Vaccines Due: Pneumoccocal, Flu, Covid-19 and Shingrix  vaccines. NCIR was verified, no new vaccines.  "

## 2024-08-25 NOTE — Patient Instructions (Signed)
 Yvonne Tyler,  Thank you for taking the time for your Medicare Wellness Visit. I appreciate your continued commitment to your health goals. Please review the care plan we discussed, and feel free to reach out if I can assist you further.  Please note that Annual Wellness Visits do not include a physical exam. Some assessments may be limited, especially if the visit was conducted virtually. If needed, we may recommend an in-person follow-up with your provider.  Ongoing Care Seeing your primary care provider every 3 to 6 months helps us  monitor your health and provide consistent, personalized care.   Referrals If a referral was made during today's visit and you haven't received any updates within two weeks, please contact the referred provider directly to check on the status.  Recommended Screenings:  Health Maintenance  Topic Date Due   Zoster (Shingles) Vaccine (1 of 2) Never done   Pneumococcal Vaccine for age over 72 (2 of 2 - PCV) 08/30/2022   Flu Shot  03/28/2024   COVID-19 Vaccine (4 - 2025-26 season) 04/28/2024   Medicare Annual Wellness Visit  05/14/2024   Breast Cancer Screening  11/18/2025   Colon Cancer Screening  02/15/2030   Osteoporosis screening with Bone Density Scan  Completed   Hepatitis C Screening  Completed   Meningitis B Vaccine  Aged Out   DTaP/Tdap/Td vaccine  Discontinued       08/25/2024    3:10 PM  Advanced Directives  Does Patient Have a Medical Advance Directive? No  Would patient like information on creating a medical advance directive? No - Patient declined    Vision: Annual vision screenings are recommended for early detection of glaucoma, cataracts, and diabetic retinopathy. These exams can also reveal signs of chronic conditions such as diabetes and high blood pressure.  Dental: Annual dental screenings help detect early signs of oral cancer, gum disease, and other conditions linked to overall health, including heart disease and diabetes.  Please  see the attached documents for additional preventive care recommendations.

## 2024-08-25 NOTE — Telephone Encounter (Signed)
 Patient's guardian, Yvonne Tyler stated that patient has started to wander a lot at night.  They do have safety latches and bells on all door knobs.They are very concerned about her wandering at night which started a couple of months ago.  Should they be worried or what do you suggest.  Please contact legal guardian.  Yvonne Sianez [name], LPN [facility] Annual Wellness Team Direct Dial: [phone]

## 2024-09-08 ENCOUNTER — Ambulatory Visit: Payer: Self-pay

## 2024-09-08 VITALS — BP 115/72 | HR 74 | Ht 60.0 in | Wt 107.8 lb

## 2024-09-08 DIAGNOSIS — Z23 Encounter for immunization: Secondary | ICD-10-CM | POA: Diagnosis not present

## 2024-09-08 DIAGNOSIS — H548 Legal blindness, as defined in USA: Secondary | ICD-10-CM | POA: Diagnosis not present

## 2024-09-08 DIAGNOSIS — F0280 Dementia in other diseases classified elsewhere without behavioral disturbance: Secondary | ICD-10-CM

## 2024-09-08 DIAGNOSIS — R2681 Unsteadiness on feet: Secondary | ICD-10-CM

## 2024-09-08 DIAGNOSIS — R410 Disorientation, unspecified: Secondary | ICD-10-CM

## 2024-09-08 DIAGNOSIS — Q909 Down syndrome, unspecified: Secondary | ICD-10-CM

## 2024-09-08 DIAGNOSIS — W108XXA Fall (on) (from) other stairs and steps, initial encounter: Secondary | ICD-10-CM

## 2024-09-08 NOTE — Assessment & Plan Note (Addendum)
 Has had progressively worsening disorientation at home, nonviolent agitation and arguing ongoing throughout the day not just isolated at night and early morning.  Has remained redirectable and behavior has been adequate at adult daycare facility. Continue redirection and working with patient is much as possible (cooperating with story to decrease distress) Will plan for transfer to memory care unit once behavior unmanageable at daycare facility. She has no access form filled out for personal vehicle transport Follow-up in 6 to 12 months Can consider further evaluation with NTG-EDSD questionnaire if needed
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
## 2024-09-08 NOTE — Patient Instructions (Addendum)
 Thank you for visiting the clinic today, it was good to see you!  Please always bring your medication bottles  In today's visit we discussed:  Fall: I have filled out the form for you to receive car assistance to the adult daycare facility.  Please continue to walk with assistance of a person at all times.  Worsening dementia: It is almost certain that you have Alzheimer's dementia, and gradually the symptoms may continue to get worse.  The adult daycare facility will be the primary provider to determine when she is no longer safe to continue the daycare help versus needing placement at a long-term care facility.  For the meantime redirection and going along with any delusions of the patient is the most adequate way for treating her symptoms and decreasing her distress.  Maintenance: We gave you your pneumococcal and flu shots today.  Please follow-up in 6 months  For any questions, please call the office at [phone] or send me a message in MyChart. Have a great day!  -Fairy Amy, MD  [facility] Resident, PGY-1

## 2024-09-15 ENCOUNTER — Encounter

## 2024-10-15 ENCOUNTER — Ambulatory Visit: Admitting: Podiatry

## 2025-05-14 ENCOUNTER — Ambulatory Visit: Admitting: Neurology
